# Patient Record
Sex: Male | Born: 1955 | Race: White | Hispanic: No | Marital: Married | Smoking: Never smoker
Health system: Southern US, Community
[De-identification: ages and names within clinical notes are randomized; demographics above are authoritative.]

## PROBLEM LIST (undated history)

## (undated) DIAGNOSIS — K219 Gastro-esophageal reflux disease without esophagitis: Secondary | ICD-10-CM

## (undated) DIAGNOSIS — F329 Major depressive disorder, single episode, unspecified: Secondary | ICD-10-CM

## (undated) DIAGNOSIS — E119 Type 2 diabetes mellitus without complications: Secondary | ICD-10-CM

## (undated) DIAGNOSIS — F419 Anxiety disorder, unspecified: Secondary | ICD-10-CM

## (undated) DIAGNOSIS — Z87442 Personal history of urinary calculi: Secondary | ICD-10-CM

## (undated) DIAGNOSIS — F32A Depression, unspecified: Secondary | ICD-10-CM

## (undated) DIAGNOSIS — M1990 Unspecified osteoarthritis, unspecified site: Secondary | ICD-10-CM

## (undated) DIAGNOSIS — I10 Essential (primary) hypertension: Secondary | ICD-10-CM

## (undated) HISTORY — DX: Gastro-esophageal reflux disease without esophagitis: K21.9

## (undated) HISTORY — PX: EYE SURGERY: SHX253

## (undated) HISTORY — PX: PENILE PROSTHESIS IMPLANT: SHX240

## (undated) HISTORY — PX: DG THUMB RIGHT HAND (ARMC HX): HXRAD1825

---

## 1997-07-06 ENCOUNTER — Encounter: Admission: RE | Admit: 1997-07-06 | Discharge: 1997-10-04 | Payer: Self-pay | Admitting: Internal Medicine

## 2015-08-29 DIAGNOSIS — E113593 Type 2 diabetes mellitus with proliferative diabetic retinopathy without macular edema, bilateral: Secondary | ICD-10-CM | POA: Diagnosis not present

## 2015-09-27 DIAGNOSIS — F332 Major depressive disorder, recurrent severe without psychotic features: Secondary | ICD-10-CM | POA: Diagnosis not present

## 2015-10-16 DIAGNOSIS — Z23 Encounter for immunization: Secondary | ICD-10-CM | POA: Diagnosis not present

## 2015-10-16 DIAGNOSIS — E1059 Type 1 diabetes mellitus with other circulatory complications: Secondary | ICD-10-CM | POA: Diagnosis not present

## 2015-10-16 DIAGNOSIS — F4323 Adjustment disorder with mixed anxiety and depressed mood: Secondary | ICD-10-CM | POA: Diagnosis not present

## 2015-10-16 DIAGNOSIS — I10 Essential (primary) hypertension: Secondary | ICD-10-CM | POA: Diagnosis not present

## 2015-10-16 DIAGNOSIS — I779 Disorder of arteries and arterioles, unspecified: Secondary | ICD-10-CM | POA: Diagnosis not present

## 2016-01-04 DIAGNOSIS — Z0000 Encounter for general adult medical examination without abnormal findings: Secondary | ICD-10-CM | POA: Diagnosis not present

## 2016-01-04 DIAGNOSIS — E1059 Type 1 diabetes mellitus with other circulatory complications: Secondary | ICD-10-CM | POA: Diagnosis not present

## 2016-01-04 DIAGNOSIS — Z125 Encounter for screening for malignant neoplasm of prostate: Secondary | ICD-10-CM | POA: Diagnosis not present

## 2016-01-09 DIAGNOSIS — Z6826 Body mass index (BMI) 26.0-26.9, adult: Secondary | ICD-10-CM | POA: Diagnosis not present

## 2016-01-09 DIAGNOSIS — Z0000 Encounter for general adult medical examination without abnormal findings: Secondary | ICD-10-CM | POA: Diagnosis not present

## 2016-01-09 DIAGNOSIS — E1059 Type 1 diabetes mellitus with other circulatory complications: Secondary | ICD-10-CM | POA: Diagnosis not present

## 2016-01-09 DIAGNOSIS — F4323 Adjustment disorder with mixed anxiety and depressed mood: Secondary | ICD-10-CM | POA: Diagnosis not present

## 2016-01-09 DIAGNOSIS — N183 Chronic kidney disease, stage 3 (moderate): Secondary | ICD-10-CM | POA: Diagnosis not present

## 2016-01-09 DIAGNOSIS — I951 Orthostatic hypotension: Secondary | ICD-10-CM | POA: Diagnosis not present

## 2016-01-09 DIAGNOSIS — Z1389 Encounter for screening for other disorder: Secondary | ICD-10-CM | POA: Diagnosis not present

## 2016-03-04 DIAGNOSIS — E113593 Type 2 diabetes mellitus with proliferative diabetic retinopathy without macular edema, bilateral: Secondary | ICD-10-CM | POA: Diagnosis not present

## 2016-03-04 DIAGNOSIS — H524 Presbyopia: Secondary | ICD-10-CM | POA: Diagnosis not present

## 2016-03-26 DIAGNOSIS — F332 Major depressive disorder, recurrent severe without psychotic features: Secondary | ICD-10-CM | POA: Diagnosis not present

## 2016-05-06 DIAGNOSIS — M5030 Other cervical disc degeneration, unspecified cervical region: Secondary | ICD-10-CM | POA: Diagnosis not present

## 2016-05-15 DIAGNOSIS — M5412 Radiculopathy, cervical region: Secondary | ICD-10-CM | POA: Diagnosis not present

## 2016-05-17 DIAGNOSIS — M5030 Other cervical disc degeneration, unspecified cervical region: Secondary | ICD-10-CM | POA: Diagnosis not present

## 2016-05-17 DIAGNOSIS — M4802 Spinal stenosis, cervical region: Secondary | ICD-10-CM | POA: Diagnosis not present

## 2016-05-22 DIAGNOSIS — M5412 Radiculopathy, cervical region: Secondary | ICD-10-CM | POA: Diagnosis not present

## 2016-05-25 DIAGNOSIS — S91301A Unspecified open wound, right foot, initial encounter: Secondary | ICD-10-CM | POA: Diagnosis not present

## 2016-06-03 DIAGNOSIS — F4323 Adjustment disorder with mixed anxiety and depressed mood: Secondary | ICD-10-CM | POA: Diagnosis not present

## 2016-06-03 DIAGNOSIS — N183 Chronic kidney disease, stage 3 (moderate): Secondary | ICD-10-CM | POA: Diagnosis not present

## 2016-06-03 DIAGNOSIS — I10 Essential (primary) hypertension: Secondary | ICD-10-CM | POA: Diagnosis not present

## 2016-06-03 DIAGNOSIS — Z1389 Encounter for screening for other disorder: Secondary | ICD-10-CM | POA: Diagnosis not present

## 2016-06-03 DIAGNOSIS — E1059 Type 1 diabetes mellitus with other circulatory complications: Secondary | ICD-10-CM | POA: Diagnosis not present

## 2016-06-11 DIAGNOSIS — M542 Cervicalgia: Secondary | ICD-10-CM | POA: Insufficient documentation

## 2016-06-11 HISTORY — DX: Cervicalgia: M54.2

## 2016-06-21 ENCOUNTER — Other Ambulatory Visit: Payer: Self-pay | Admitting: Neurosurgery

## 2016-06-21 DIAGNOSIS — M5030 Other cervical disc degeneration, unspecified cervical region: Secondary | ICD-10-CM | POA: Diagnosis not present

## 2016-06-21 DIAGNOSIS — G8250 Quadriplegia, unspecified: Secondary | ICD-10-CM

## 2016-06-21 DIAGNOSIS — M4722 Other spondylosis with radiculopathy, cervical region: Secondary | ICD-10-CM | POA: Diagnosis not present

## 2016-06-21 DIAGNOSIS — M4712 Other spondylosis with myelopathy, cervical region: Secondary | ICD-10-CM | POA: Diagnosis not present

## 2016-06-21 DIAGNOSIS — M5000 Cervical disc disorder with myelopathy, unspecified cervical region: Secondary | ICD-10-CM | POA: Diagnosis not present

## 2016-06-21 DIAGNOSIS — M542 Cervicalgia: Secondary | ICD-10-CM | POA: Diagnosis not present

## 2016-06-21 HISTORY — DX: Quadriplegia, unspecified: G82.50

## 2016-07-22 ENCOUNTER — Other Ambulatory Visit (HOSPITAL_COMMUNITY): Payer: Self-pay | Admitting: *Deleted

## 2016-07-22 ENCOUNTER — Encounter (HOSPITAL_COMMUNITY)
Admission: RE | Admit: 2016-07-22 | Discharge: 2016-07-22 | Disposition: A | Discharge status: Home / Self Care | Payer: BLUE CROSS/BLUE SHIELD | Source: Ambulatory Visit | Attending: Neurosurgery | Admitting: Neurosurgery

## 2016-07-22 ENCOUNTER — Encounter (HOSPITAL_COMMUNITY): Payer: Self-pay

## 2016-07-22 DIAGNOSIS — E119 Type 2 diabetes mellitus without complications: Secondary | ICD-10-CM | POA: Diagnosis not present

## 2016-07-22 DIAGNOSIS — Z01812 Encounter for preprocedural laboratory examination: Secondary | ICD-10-CM | POA: Diagnosis not present

## 2016-07-22 DIAGNOSIS — Z01810 Encounter for preprocedural cardiovascular examination: Secondary | ICD-10-CM | POA: Insufficient documentation

## 2016-07-22 DIAGNOSIS — Z87442 Personal history of urinary calculi: Secondary | ICD-10-CM | POA: Diagnosis not present

## 2016-07-22 DIAGNOSIS — M1990 Unspecified osteoarthritis, unspecified site: Secondary | ICD-10-CM | POA: Diagnosis not present

## 2016-07-22 DIAGNOSIS — I10 Essential (primary) hypertension: Secondary | ICD-10-CM | POA: Insufficient documentation

## 2016-07-22 DIAGNOSIS — F419 Anxiety disorder, unspecified: Secondary | ICD-10-CM | POA: Insufficient documentation

## 2016-07-22 DIAGNOSIS — F329 Major depressive disorder, single episode, unspecified: Secondary | ICD-10-CM | POA: Insufficient documentation

## 2016-07-22 HISTORY — DX: Anxiety disorder, unspecified: F41.9

## 2016-07-22 HISTORY — DX: Essential (primary) hypertension: I10

## 2016-07-22 HISTORY — DX: Unspecified osteoarthritis, unspecified site: M19.90

## 2016-07-22 HISTORY — DX: Personal history of urinary calculi: Z87.442

## 2016-07-22 HISTORY — DX: Major depressive disorder, single episode, unspecified: F32.9

## 2016-07-22 HISTORY — DX: Depression, unspecified: F32.A

## 2016-07-22 HISTORY — DX: Type 2 diabetes mellitus without complications: E11.9

## 2016-07-22 LAB — TYPE AND SCREEN
ABO/RH(D): O POS
Antibody Screen: NEGATIVE

## 2016-07-22 LAB — BASIC METABOLIC PANEL
Anion gap: 6 (ref 5–15)
BUN: 19 mg/dL (ref 6–20)
CO2: 27 mmol/L (ref 22–32)
Calcium: 9.2 mg/dL (ref 8.9–10.3)
Chloride: 105 mmol/L (ref 101–111)
Creatinine, Ser: 1.16 mg/dL (ref 0.61–1.24)
GFR calc Af Amer: >60 mL/min (ref >60)
GFR calc non Af Amer: >60 mL/min (ref >60)
Glucose, Bld: 199 mg/dL — ABNORMAL HIGH (ref 65–99)
Potassium: 4.3 mmol/L (ref 3.5–5.1)
Sodium: 138 mmol/L (ref 135–145)

## 2016-07-22 LAB — CBC
HCT: 45.2 % (ref 39.0–52.0)
Hemoglobin: 15.1 g/dL (ref 13.0–17.0)
MCH: 31.1 pg (ref 26.0–34.0)
MCHC: 33.4 g/dL (ref 30.0–36.0)
MCV: 93 fL (ref 78.0–100.0)
Platelets: 190 10*3/uL (ref 150–400)
RBC: 4.86 MIL/uL (ref 4.22–5.81)
RDW: 13.6 % (ref 11.5–15.5)
WBC: 6.3 10*3/uL (ref 4.0–10.5)

## 2016-07-22 LAB — SURGICAL PCR SCREEN
MRSA, PCR: NEGATIVE
Staphylococcus aureus: NEGATIVE

## 2016-07-22 LAB — ABO/RH: ABO/RH(D): O POS

## 2016-07-22 LAB — GLUCOSE, CAPILLARY: Glucose-Capillary: 205 mg/dL — ABNORMAL HIGH (ref 65–99)

## 2016-07-22 NOTE — Pre-Procedure Instructions (Signed)
Jonathan Holder  07/22/2016      [facility], [place] - [address] [employer] [employer] [place] [address] Phone: [phone] Fax: [phone]    Your procedure is scheduled on 07-31-2016 Wednesday .  Report to [facility] Admitting at 5:30 A.M   .  Call this number if you have problems the morning of surgery:  [phone]   Remember:  Do not eat food or drink liquids after midnight.   Take these medicines the morning of surgery with A SIP OF WATER Amlodipine(Norvasc),Ariprazole(Abilify),atorvastatin(Lipitor),desvenlafaxine(Pristiq),gabapentin(Neurontin)     STOP ASPIRIN,ANTIINFLAMATORIES (IBUPROFEN,ALEVE,MOTRIN,ADVIL,GOODY'S POWDERS),HERBAL SUPPLEMENTS,FISH OIL,AND VITAMINS 5-7 DAYS PRIOR TO SURGERY STOP CELEBREX      How to Manage Your Diabetes Before and After Surgery  Why is it important to control my blood sugar before and after surgery? . Improving blood sugar levels before and after surgery helps healing and can limit problems. . A way of improving blood sugar control is eating a healthy diet by: o  Eating less sugar and carbohydrates o  Increasing activity/exercise o  Talking with your doctor about reaching your blood sugar goals . High blood sugars (greater than 180 mg/dL) can raise your risk of infections and slow your recovery, so you will need to focus on controlling your diabetes during the weeks before surgery. . Make sure that the doctor who takes care of your diabetes knows about your planned surgery including the date and location.  How do I manage my blood sugar before surgery? . Check your blood sugar at least 4 times a day, starting 2 days before surgery, to make sure that the level is not too high or low. o Check your blood sugar the morning of your surgery when you wake up and every 2 hours until you get to the Short Stay unit. . If your blood sugar is less than 70 mg/dL, you will need to treat  for low blood sugar: o Do not take insulin. o Treat a low blood sugar (less than 70 mg/dL) with  cup of clear juice (cranberry or apple), 4 glucose tablets, OR glucose gel. o Recheck blood sugar in 15 minutes after treatment (to make sure it is greater than 70 mg/dL). If your blood sugar is not greater than 70 mg/dL on recheck, call [phone] for further instructions. . Report your blood sugar to the short stay nurse when you get to Short Stay.  . If you are admitted to the hospital after surgery: o Your blood sugar will be checked by the staff and you will probably be given insulin after surgery (instead of oral diabetes medicines) to make sure you have good blood sugar levels. o The goal for blood sugar control after surgery is 80-180 mg/dL.              WHAT DO I DO ABOUT MY DIABETES MEDICATION?   Marland Kitchen The night before surgery take your usual dose of insulin before meal  . THE NIGHT BEFORE SURGERY, take 20 units of _Lantus insulin.at bedtime        Reviewed and Endorsed by [facility] Patient Education Committee, August 2015    Do not wear jewelry, make-up or nail polish.  Do not wear lotions, powders, or perfumes, or deoderant.  Do not shave 48 hours prior to surgery.  Men may shave face and neck.  Do not bring valuables to the hospital.  [facility] is not responsible for any belongings or valuables.  Contacts, dentures or bridgework may not be worn into surgery.  Leave your suitcase in the car.  After surgery it may be brought to your room.  For patients admitted to the hospital, discharge time will be determined by your treatment team.  Patients discharged the day of surgery will not be allowed to drive home.   Special Instructions: [facility] - Preparing for Surgery  Before surgery, you can play an important role.  Because skin is not sterile, your skin needs to be as free of germs as possible.  You can reduce the number of germs on you skin by washing with CHG  (chlorahexidine gluconate) soap before surgery.  CHG is an antiseptic cleaner which kills germs and bonds with the skin to continue killing germs even after washing.  Please DO NOT use if you have an allergy to CHG or antibacterial soaps.  If your skin becomes reddened/irritated stop using the CHG and inform your nurse when you arrive at Short Stay.  Do not shave (including legs and underarms) for at least 48 hours prior to the first CHG shower.  You may shave your face.  Please follow these instructions carefully:   1.  Shower with CHG Soap the night before surgery and the   morning of Surgery.  2.  If you choose to wash your hair, wash your hair first as usual with your normal shampoo.  3.  After you shampoo, rinse your hair and body thoroughly to remove the  Shampoo.  4.  Use CHG as you would any other liquid soap.  You can apply chg directly  to the skin and wash gently with scrungie or a clean washcloth.  5.  Apply the CHG Soap to your body ONLY FROM THE NECK DOWN.   Do not use on open wounds or open sores.  Avoid contact with your eyes,  ears, mouth and genitals (private parts).  Wash genitals (private parts) with your normal soap.  6.  Wash thoroughly, paying special attention to the area where your surgery will be performed.  7.  Thoroughly rinse your body with warm water from the neck down.  8.  DO NOT shower/wash with your normal soap after using and rinsing o  the CHG Soap.  9.  Pat yourself dry with a clean towel.            10.  Wear clean pajamas.            11.  Place clean sheets on your bed the night of your first shower and do not sleep with pets.  Day of Surgery  Do not apply any lotions/deodorants the morning of surgery.  Please wear clean clothes to the hospital/surgery center.     Please read over the following fact sheets that you were given. MRSA Information and Surgical Site Infection Prevention

## 2016-07-22 NOTE — Progress Notes (Signed)
Never seen by cardiologist  Denies any cardiac testing  Denies any chest pain or discomfort

## 2016-07-23 LAB — HEMOGLOBIN A1C
Hgb A1c MFr Bld: 8 % — ABNORMAL HIGH (ref 4.8–5.6)
Mean Plasma Glucose: 183 mg/dL

## 2016-07-31 ENCOUNTER — Encounter (HOSPITAL_COMMUNITY): Payer: Self-pay | Admitting: General Practice

## 2016-07-31 ENCOUNTER — Encounter (HOSPITAL_COMMUNITY)
Admission: RE | Disposition: A | Discharge status: Home / Self Care | Payer: Self-pay | Source: Ambulatory Visit | Attending: Neurosurgery

## 2016-07-31 ENCOUNTER — Ambulatory Visit (HOSPITAL_COMMUNITY): Payer: BLUE CROSS/BLUE SHIELD | Admitting: Emergency Medicine

## 2016-07-31 ENCOUNTER — Ambulatory Visit (HOSPITAL_COMMUNITY): Payer: BLUE CROSS/BLUE SHIELD | Admitting: Anesthesiology

## 2016-07-31 ENCOUNTER — Observation Stay (HOSPITAL_COMMUNITY)
Admission: RE | Admit: 2016-07-31 | Discharge: 2016-08-01 | Disposition: A | Discharge status: Home / Self Care | Payer: BLUE CROSS/BLUE SHIELD | Source: Ambulatory Visit | Attending: Neurosurgery | Admitting: Neurosurgery

## 2016-07-31 ENCOUNTER — Ambulatory Visit (HOSPITAL_COMMUNITY): Payer: BLUE CROSS/BLUE SHIELD

## 2016-07-31 DIAGNOSIS — M4322 Fusion of spine, cervical region: Secondary | ICD-10-CM | POA: Diagnosis not present

## 2016-07-31 DIAGNOSIS — M4712 Other spondylosis with myelopathy, cervical region: Secondary | ICD-10-CM | POA: Insufficient documentation

## 2016-07-31 DIAGNOSIS — Z7982 Long term (current) use of aspirin: Secondary | ICD-10-CM | POA: Insufficient documentation

## 2016-07-31 DIAGNOSIS — Z794 Long term (current) use of insulin: Secondary | ICD-10-CM | POA: Diagnosis not present

## 2016-07-31 DIAGNOSIS — M50121 Cervical disc disorder at C4-C5 level with radiculopathy: Secondary | ICD-10-CM | POA: Diagnosis not present

## 2016-07-31 DIAGNOSIS — I10 Essential (primary) hypertension: Secondary | ICD-10-CM | POA: Insufficient documentation

## 2016-07-31 DIAGNOSIS — E119 Type 2 diabetes mellitus without complications: Secondary | ICD-10-CM | POA: Insufficient documentation

## 2016-07-31 DIAGNOSIS — Z419 Encounter for procedure for purposes other than remedying health state, unspecified: Secondary | ICD-10-CM

## 2016-07-31 DIAGNOSIS — Z79899 Other long term (current) drug therapy: Secondary | ICD-10-CM | POA: Insufficient documentation

## 2016-07-31 DIAGNOSIS — Z791 Long term (current) use of non-steroidal anti-inflammatories (NSAID): Secondary | ICD-10-CM | POA: Insufficient documentation

## 2016-07-31 DIAGNOSIS — M4722 Other spondylosis with radiculopathy, cervical region: Secondary | ICD-10-CM | POA: Diagnosis not present

## 2016-07-31 DIAGNOSIS — M50023 Cervical disc disorder at C6-C7 level with myelopathy: Secondary | ICD-10-CM | POA: Diagnosis not present

## 2016-07-31 DIAGNOSIS — G8250 Quadriplegia, unspecified: Secondary | ICD-10-CM | POA: Insufficient documentation

## 2016-07-31 DIAGNOSIS — M50021 Cervical disc disorder at C4-C5 level with myelopathy: Principal | ICD-10-CM | POA: Insufficient documentation

## 2016-07-31 DIAGNOSIS — M5000 Cervical disc disorder with myelopathy, unspecified cervical region: Secondary | ICD-10-CM

## 2016-07-31 DIAGNOSIS — F329 Major depressive disorder, single episode, unspecified: Secondary | ICD-10-CM | POA: Insufficient documentation

## 2016-07-31 DIAGNOSIS — M50022 Cervical disc disorder at C5-C6 level with myelopathy: Secondary | ICD-10-CM | POA: Diagnosis not present

## 2016-07-31 DIAGNOSIS — M50221 Other cervical disc displacement at C4-C5 level: Secondary | ICD-10-CM | POA: Diagnosis not present

## 2016-07-31 HISTORY — DX: Cervical disc disorder with myelopathy, unspecified cervical region: M50.00

## 2016-07-31 HISTORY — PX: ANTERIOR CERVICAL DECOMP/DISCECTOMY FUSION: SHX1161

## 2016-07-31 LAB — GLUCOSE, CAPILLARY
Glucose-Capillary: 188 mg/dL — ABNORMAL HIGH (ref 65–99)
Glucose-Capillary: 40 mg/dL — CL (ref 65–99)
Glucose-Capillary: 194 mg/dL — ABNORMAL HIGH (ref 65–99)
Glucose-Capillary: 352 mg/dL — ABNORMAL HIGH (ref 65–99)
Glucose-Capillary: 413 mg/dL — ABNORMAL HIGH (ref 65–99)

## 2016-07-31 SURGERY — ANTERIOR CERVICAL DECOMPRESSION/DISCECTOMY FUSION 3 LEVELS
Anesthesia: General

## 2016-07-31 MED ORDER — ONDANSETRON HCL 4 MG/2ML IJ SOLN: 4.0000 mg | Freq: Once | INTRAMUSCULAR | Status: DC | PRN

## 2016-07-31 MED ORDER — BUPIVACAINE HCL (PF) 0.5 % IJ SOLN
INTRAMUSCULAR | Status: DC | PRN
  Administered 2016-07-31: 20 mL

## 2016-07-31 MED ORDER — THROMBIN 20000 UNITS EX SOLR
CUTANEOUS | Status: AC
  Filled 2016-07-31: qty 20000

## 2016-07-31 MED ORDER — INSULIN GLARGINE 100 UNIT/ML ~~LOC~~ SOLN
20.0000 [IU] | Freq: Every day | SUBCUTANEOUS | Status: DC
  Administered 2016-07-31: 20 [IU] via SUBCUTANEOUS
  Filled 2016-07-31 (×2): qty 0.2

## 2016-07-31 MED ORDER — DEXAMETHASONE SODIUM PHOSPHATE 10 MG/ML IJ SOLN
INTRAMUSCULAR | Status: AC
  Filled 2016-07-31: qty 1

## 2016-07-31 MED ORDER — PROPOFOL 10 MG/ML IV BOLUS
INTRAVENOUS | Status: AC
  Filled 2016-07-31: qty 40

## 2016-07-31 MED ORDER — HYDROMORPHONE HCL 1 MG/ML IJ SOLN: 0.2500 mg | INTRAMUSCULAR | Status: DC | PRN

## 2016-07-31 MED ORDER — CYCLOBENZAPRINE HCL 5 MG PO TABS: 5.0000 mg | ORAL_TABLET | Freq: Three times a day (TID) | ORAL | Status: DC | PRN

## 2016-07-31 MED ORDER — ARIPIPRAZOLE 5 MG PO TABS
5.0000 mg | ORAL_TABLET | ORAL | Status: DC
  Administered 2016-08-01: 5 mg via ORAL
  Filled 2016-07-31 (×2): qty 1

## 2016-07-31 MED ORDER — LIDOCAINE HCL (CARDIAC) 20 MG/ML IV SOLN
INTRAVENOUS | Status: DC | PRN
  Administered 2016-07-31: 100 mg via INTRAVENOUS

## 2016-07-31 MED ORDER — LIDOCAINE 2% (20 MG/ML) 5 ML SYRINGE
INTRAMUSCULAR | Status: AC
  Filled 2016-07-31: qty 5

## 2016-07-31 MED ORDER — ONDANSETRON HCL 4 MG/2ML IJ SOLN: 4.0000 mg | Freq: Four times a day (QID) | INTRAMUSCULAR | Status: DC | PRN

## 2016-07-31 MED ORDER — ONDANSETRON HCL 4 MG PO TABS: 4.0000 mg | ORAL_TABLET | Freq: Four times a day (QID) | ORAL | Status: DC | PRN

## 2016-07-31 MED ORDER — ONDANSETRON HCL 4 MG/2ML IJ SOLN
INTRAMUSCULAR | Status: AC
  Filled 2016-07-31: qty 2

## 2016-07-31 MED ORDER — INSULIN ASPART 100 UNIT/ML ~~LOC~~ SOLN
0.0000 [IU] | Freq: Three times a day (TID) | SUBCUTANEOUS | Status: DC
  Administered 2016-07-31: 15 [IU] via SUBCUTANEOUS
  Administered 2016-08-01: 5 [IU] via SUBCUTANEOUS
  Administered 2016-08-01: 8 [IU] via SUBCUTANEOUS
  Administered 2016-08-01: 11 [IU] via SUBCUTANEOUS

## 2016-07-31 MED ORDER — GABAPENTIN 300 MG PO CAPS
900.0000 mg | ORAL_CAPSULE | ORAL | Status: DC
  Administered 2016-08-01: 900 mg via ORAL
  Filled 2016-07-31: qty 3

## 2016-07-31 MED ORDER — THROMBIN 20000 UNITS EX SOLR
CUTANEOUS | Status: DC | PRN
  Administered 2016-07-31: 09:00:00 via TOPICAL

## 2016-07-31 MED ORDER — SUGAMMADEX SODIUM 200 MG/2ML IV SOLN
INTRAVENOUS | Status: AC
  Filled 2016-07-31: qty 2

## 2016-07-31 MED ORDER — DEXTROSE 50 % IV SOLN
INTRAVENOUS | Status: AC
  Filled 2016-07-31: qty 50

## 2016-07-31 MED ORDER — ONDANSETRON HCL 4 MG/2ML IJ SOLN
INTRAMUSCULAR | Status: DC | PRN
  Administered 2016-07-31: 4 mg via INTRAVENOUS

## 2016-07-31 MED ORDER — EPHEDRINE 5 MG/ML INJ
INTRAVENOUS | Status: AC
  Filled 2016-07-31: qty 20

## 2016-07-31 MED ORDER — INSULIN ASPART 100 UNIT/ML ~~LOC~~ SOLN: 3.0000 [IU] | Freq: Three times a day (TID) | SUBCUTANEOUS | Status: DC

## 2016-07-31 MED ORDER — ATORVASTATIN CALCIUM 40 MG PO TABS
40.0000 mg | ORAL_TABLET | ORAL | Status: DC
  Administered 2016-08-01: 40 mg via ORAL
  Filled 2016-07-31: qty 1
  Filled 2016-07-31: qty 2
  Filled 2016-07-31: qty 1

## 2016-07-31 MED ORDER — BISACODYL 10 MG RE SUPP: 10.0000 mg | Freq: Every day | RECTAL | Status: DC | PRN

## 2016-07-31 MED ORDER — ROCURONIUM BROMIDE 100 MG/10ML IV SOLN
INTRAVENOUS | Status: DC | PRN
  Administered 2016-07-31: 50 mg via INTRAVENOUS
  Administered 2016-07-31 (×3): 10 mg via INTRAVENOUS

## 2016-07-31 MED ORDER — FENTANYL CITRATE (PF) 250 MCG/5ML IJ SOLN
INTRAMUSCULAR | Status: AC
  Filled 2016-07-31: qty 5

## 2016-07-31 MED ORDER — CHLORHEXIDINE GLUCONATE CLOTH 2 % EX PADS: 6.0000 | MEDICATED_PAD | Freq: Once | CUTANEOUS | Status: DC

## 2016-07-31 MED ORDER — SODIUM CHLORIDE 0.9 % IR SOLN
Status: DC | PRN
  Administered 2016-07-31: 07:00:00

## 2016-07-31 MED ORDER — PHENYLEPHRINE HCL 10 MG/ML IJ SOLN
INTRAVENOUS | Status: DC | PRN
  Administered 2016-07-31: 35 ug/min via INTRAVENOUS

## 2016-07-31 MED ORDER — HYDROCODONE-ACETAMINOPHEN 5-325 MG PO TABS
1.0000 | ORAL_TABLET | ORAL | Status: DC | PRN
  Filled 2016-07-31: qty 2

## 2016-07-31 MED ORDER — EPHEDRINE SULFATE 50 MG/ML IJ SOLN
INTRAMUSCULAR | Status: DC | PRN
  Administered 2016-07-31: 15 mg via INTRAVENOUS
  Administered 2016-07-31 (×2): 10 mg via INTRAVENOUS

## 2016-07-31 MED ORDER — LACTATED RINGERS IV SOLN
INTRAVENOUS | Status: DC
  Administered 2016-07-31 (×4): via INTRAVENOUS

## 2016-07-31 MED ORDER — AMLODIPINE BESYLATE 2.5 MG PO TABS
2.5000 mg | ORAL_TABLET | ORAL | Status: DC
  Administered 2016-08-01: 2.5 mg via ORAL
  Filled 2016-07-31 (×2): qty 1

## 2016-07-31 MED ORDER — MEPERIDINE HCL 25 MG/ML IJ SOLN: 6.2500 mg | INTRAMUSCULAR | Status: DC | PRN

## 2016-07-31 MED ORDER — DEXAMETHASONE SODIUM PHOSPHATE 10 MG/ML IJ SOLN
INTRAMUSCULAR | Status: DC | PRN
  Administered 2016-07-31: 10 mg via INTRAVENOUS

## 2016-07-31 MED ORDER — KETOROLAC TROMETHAMINE 30 MG/ML IJ SOLN
15.0000 mg | Freq: Four times a day (QID) | INTRAMUSCULAR | Status: DC
  Administered 2016-07-31 – 2016-08-01 (×3): 15 mg via INTRAVENOUS
  Filled 2016-07-31 (×3): qty 1

## 2016-07-31 MED ORDER — BUPIVACAINE HCL (PF) 0.5 % IJ SOLN
INTRAMUSCULAR | Status: AC
  Filled 2016-07-31: qty 30

## 2016-07-31 MED ORDER — VENLAFAXINE HCL ER 150 MG PO CP24
150.0000 mg | ORAL_CAPSULE | Freq: Every day | ORAL | Status: DC
  Administered 2016-08-01: 150 mg via ORAL
  Filled 2016-07-31 (×2): qty 1
  Filled 2016-07-31: qty 2

## 2016-07-31 MED ORDER — THROMBIN 5000 UNITS EX SOLR
OROMUCOSAL | Status: DC | PRN
  Administered 2016-07-31: 08:00:00 via TOPICAL

## 2016-07-31 MED ORDER — MAGNESIUM HYDROXIDE 400 MG/5ML PO SUSP: 30.0000 mL | Freq: Every day | ORAL | Status: DC | PRN

## 2016-07-31 MED ORDER — INSULIN ASPART 100 UNIT/ML ~~LOC~~ SOLN
0.0000 [IU] | Freq: Every day | SUBCUTANEOUS | Status: DC
  Administered 2016-07-31: 5 [IU] via SUBCUTANEOUS

## 2016-07-31 MED ORDER — HYDROXYZINE HCL 25 MG PO TABS: 50.0000 mg | ORAL_TABLET | ORAL | Status: DC | PRN

## 2016-07-31 MED ORDER — SODIUM CHLORIDE 0.9% FLUSH: 3.0000 mL | INTRAVENOUS | Status: DC | PRN

## 2016-07-31 MED ORDER — ACETAMINOPHEN 325 MG PO TABS
650.0000 mg | ORAL_TABLET | ORAL | Status: DC | PRN
  Administered 2016-08-01: 650 mg via ORAL
  Filled 2016-07-31: qty 2

## 2016-07-31 MED ORDER — PHENYLEPHRINE 40 MCG/ML (10ML) SYRINGE FOR IV PUSH (FOR BLOOD PRESSURE SUPPORT)
PREFILLED_SYRINGE | INTRAVENOUS | Status: AC
  Filled 2016-07-31: qty 20

## 2016-07-31 MED ORDER — HYDROXYZINE HCL 50 MG/ML IM SOLN: 50.0000 mg | INTRAMUSCULAR | Status: DC | PRN

## 2016-07-31 MED ORDER — ACETAMINOPHEN 10 MG/ML IV SOLN
INTRAVENOUS | Status: AC
  Filled 2016-07-31: qty 100

## 2016-07-31 MED ORDER — SUGAMMADEX SODIUM 200 MG/2ML IV SOLN
INTRAVENOUS | Status: DC | PRN
  Administered 2016-07-31: 150 mg via INTRAVENOUS

## 2016-07-31 MED ORDER — MENTHOL 3 MG MT LOZG
1.0000 | LOZENGE | OROMUCOSAL | Status: DC | PRN
  Filled 2016-07-31: qty 9

## 2016-07-31 MED ORDER — KETOROLAC TROMETHAMINE 30 MG/ML IJ SOLN
15.0000 mg | Freq: Once | INTRAMUSCULAR | Status: DC
  Administered 2016-07-31: 15 mg via INTRAVENOUS

## 2016-07-31 MED ORDER — 0.9 % SODIUM CHLORIDE (POUR BTL) OPTIME
TOPICAL | Status: DC | PRN
  Administered 2016-07-31: 1000 mL

## 2016-07-31 MED ORDER — LIDOCAINE-EPINEPHRINE 1 %-1:100000 IJ SOLN
INTRAMUSCULAR | Status: AC
  Filled 2016-07-31: qty 1

## 2016-07-31 MED ORDER — FENTANYL CITRATE (PF) 100 MCG/2ML IJ SOLN
INTRAMUSCULAR | Status: DC | PRN
  Administered 2016-07-31: 50 ug via INTRAVENOUS
  Administered 2016-07-31: 150 ug via INTRAVENOUS
  Administered 2016-07-31 (×2): 50 ug via INTRAVENOUS

## 2016-07-31 MED ORDER — ROCURONIUM BROMIDE 10 MG/ML (PF) SYRINGE
PREFILLED_SYRINGE | INTRAVENOUS | Status: AC
  Filled 2016-07-31: qty 5

## 2016-07-31 MED ORDER — DEXTROSE 50 % IV SOLN
1.0000 | Freq: Once | INTRAVENOUS | Status: AC
  Administered 2016-07-31: 50 mL via INTRAVENOUS

## 2016-07-31 MED ORDER — ALUM & MAG HYDROXIDE-SIMETH 200-200-20 MG/5ML PO SUSP: 30.0000 mL | Freq: Four times a day (QID) | ORAL | Status: DC | PRN

## 2016-07-31 MED ORDER — LIDOCAINE-EPINEPHRINE 1 %-1:100000 IJ SOLN
INTRAMUSCULAR | Status: DC | PRN
  Administered 2016-07-31: 20 mL

## 2016-07-31 MED ORDER — MIDAZOLAM HCL 2 MG/2ML IJ SOLN
INTRAMUSCULAR | Status: AC
  Filled 2016-07-31: qty 2

## 2016-07-31 MED ORDER — SODIUM CHLORIDE 0.9% FLUSH
3.0000 mL | Freq: Two times a day (BID) | INTRAVENOUS | Status: DC
  Administered 2016-07-31: 3 mL via INTRAVENOUS

## 2016-07-31 MED ORDER — PHENYLEPHRINE HCL 10 MG/ML IJ SOLN
INTRAMUSCULAR | Status: DC | PRN
  Administered 2016-07-31 (×3): 80 ug via INTRAVENOUS

## 2016-07-31 MED ORDER — ACETAMINOPHEN 650 MG RE SUPP: 650.0000 mg | RECTAL | Status: DC | PRN

## 2016-07-31 MED ORDER — KETOROLAC TROMETHAMINE 15 MG/ML IJ SOLN
INTRAMUSCULAR | Status: AC
  Filled 2016-07-31: qty 1

## 2016-07-31 MED ORDER — THROMBIN 5000 UNITS EX SOLR
CUTANEOUS | Status: AC
  Filled 2016-07-31: qty 5000

## 2016-07-31 MED ORDER — POTASSIUM CHLORIDE IN NACL 20-0.9 MEQ/L-% IV SOLN: INTRAVENOUS | Status: DC

## 2016-07-31 MED ORDER — MORPHINE SULFATE (PF) 4 MG/ML IV SOLN: 4.0000 mg | INTRAVENOUS | Status: DC | PRN

## 2016-07-31 MED ORDER — CEFAZOLIN SODIUM-DEXTROSE 2-4 GM/100ML-% IV SOLN
2.0000 g | INTRAVENOUS | Status: AC
  Administered 2016-07-31 (×2): 2 g via INTRAVENOUS

## 2016-07-31 MED ORDER — FLEET ENEMA 7-19 GM/118ML RE ENEM: 1.0000 | ENEMA | Freq: Once | RECTAL | Status: DC | PRN

## 2016-07-31 MED ORDER — PROPOFOL 10 MG/ML IV BOLUS
INTRAVENOUS | Status: DC | PRN
  Administered 2016-07-31: 100 mg via INTRAVENOUS

## 2016-07-31 MED ORDER — SUGAMMADEX SODIUM 500 MG/5ML IV SOLN
INTRAVENOUS | Status: AC
  Filled 2016-07-31: qty 5

## 2016-07-31 MED ORDER — PHENOL 1.4 % MT LIQD: 1.0000 | OROMUCOSAL | Status: DC | PRN

## 2016-07-31 MED ORDER — ACETAMINOPHEN 10 MG/ML IV SOLN
INTRAVENOUS | Status: DC | PRN
  Administered 2016-07-31: 1000 mg via INTRAVENOUS

## 2016-07-31 MED ORDER — MIDAZOLAM HCL 5 MG/5ML IJ SOLN
INTRAMUSCULAR | Status: DC | PRN
  Administered 2016-07-31: 2 mg via INTRAVENOUS

## 2016-07-31 SURGICAL SUPPLY — 63 items
ALLOGRAFT 7X14X11 (Bone Implant) ×4 IMPLANT
ALLOGRAFT CA 6X14X11 (Bone Implant) IMPLANT
APL SKNCLS STERI-STRIP NONHPOA (GAUZE/BANDAGES/DRESSINGS) ×1
BAG DECANTER FOR FLEXI CONT (MISCELLANEOUS) ×2 IMPLANT
BASKET BONE COLLECTION (BASKET) ×2 IMPLANT
BENZOIN TINCTURE PRP APPL 2/3 (GAUZE/BANDAGES/DRESSINGS) ×2 IMPLANT
BIT DRILL 13 (BIT) ×2 IMPLANT
BIT DRILL NEURO 2X3.1 SFT TUCH (MISCELLANEOUS) ×1 IMPLANT
BLADE ULTRA TIP 2M (BLADE) ×2 IMPLANT
CANISTER SUCT 3000ML PPV (MISCELLANEOUS) ×2 IMPLANT
CARTRIDGE OIL MAESTRO DRILL (MISCELLANEOUS) ×1 IMPLANT
COVER MAYO STAND STRL (DRAPES) ×2 IMPLANT
DECANTER SPIKE VIAL GLASS SM (MISCELLANEOUS) ×2 IMPLANT
DERMABOND ADVANCED (GAUZE/BANDAGES/DRESSINGS) ×2
DERMABOND ADVANCED .7 DNX12 (GAUZE/BANDAGES/DRESSINGS) ×2 IMPLANT
DIFFUSER DRILL AIR PNEUMATIC (MISCELLANEOUS) ×2 IMPLANT
DRAPE HALF SHEET 40X57 (DRAPES) ×2 IMPLANT
DRAPE LAPAROTOMY 100X72 PEDS (DRAPES) ×2 IMPLANT
DRAPE MICROSCOPE LEICA (MISCELLANEOUS) ×2 IMPLANT
DRAPE POUCH INSTRU U-SHP 10X18 (DRAPES) ×2 IMPLANT
DRILL NEURO 2X3.1 SOFT TOUCH (MISCELLANEOUS) ×2
ELECT COATED BLADE 2.86 ST (ELECTRODE) ×4 IMPLANT
ELECT REM PT RETURN 9FT ADLT (ELECTROSURGICAL) ×2
ELECTRODE REM PT RTRN 9FT ADLT (ELECTROSURGICAL) ×1 IMPLANT
GLOVE BIOGEL PI IND STRL 8 (GLOVE) ×3 IMPLANT
GLOVE BIOGEL PI INDICATOR 8 (GLOVE) ×3
GLOVE ECLIPSE 7.5 STRL STRAW (GLOVE) ×10 IMPLANT
GLOVE EXAM NITRILE LRG STRL (GLOVE) IMPLANT
GLOVE EXAM NITRILE XL STR (GLOVE) IMPLANT
GLOVE EXAM NITRILE XS STR PU (GLOVE) IMPLANT
GOWN STRL REUS W/ TWL LRG LVL3 (GOWN DISPOSABLE) ×1 IMPLANT
GOWN STRL REUS W/ TWL XL LVL3 (GOWN DISPOSABLE) ×1 IMPLANT
GOWN STRL REUS W/TWL 2XL LVL3 (GOWN DISPOSABLE) ×6 IMPLANT
GOWN STRL REUS W/TWL LRG LVL3 (GOWN DISPOSABLE) ×2
GOWN STRL REUS W/TWL XL LVL3 (GOWN DISPOSABLE) ×2
GRAFT CORT CANC 14X8.25X11 5D (Bone Implant) ×2 IMPLANT
HALTER HD/CHIN CERV TRACTION D (MISCELLANEOUS) ×2 IMPLANT
HEMOSTAT POWDER KIT SURGIFOAM (HEMOSTASIS) ×2 IMPLANT
KIT BASIN OR (CUSTOM PROCEDURE TRAY) ×2 IMPLANT
KIT ROOM TURNOVER OR (KITS) ×2 IMPLANT
NEEDLE HYPO 25X1 1.5 SAFETY (NEEDLE) ×2 IMPLANT
NEEDLE SPNL 22GX3.5 QUINCKE BK (NEEDLE) ×4 IMPLANT
NS IRRIG 1000ML POUR BTL (IV SOLUTION) ×2 IMPLANT
OIL CARTRIDGE MAESTRO DRILL (MISCELLANEOUS) ×2
PACK LAMINECTOMY NEURO (CUSTOM PROCEDURE TRAY) ×2 IMPLANT
PAD ARMBOARD 7.5X6 YLW CONV (MISCELLANEOUS) ×6 IMPLANT
PATTIES SURGICAL .5 X.5 (GAUZE/BANDAGES/DRESSINGS) ×2 IMPLANT
PATTIES SURGICAL 1X1 (DISPOSABLE) ×2 IMPLANT
PENCIL BUTTON HOLSTER BLD 10FT (ELECTRODE) ×2 IMPLANT
PLATE 3 55XLCK NS SPNE CVD (Plate) ×1 IMPLANT
PLATE 3 ATLANTIS TRANS (Plate) ×2 IMPLANT
RUBBERBAND STERILE (MISCELLANEOUS) ×4 IMPLANT
SCREW ST FIX 4 ATL 3120213 (Screw) ×14 IMPLANT
SPONGE INTESTINAL PEANUT (DISPOSABLE) ×2 IMPLANT
SPONGE SURGIFOAM ABS GEL 100 (HEMOSTASIS) ×2 IMPLANT
STAPLER SKIN PROX WIDE 3.9 (STAPLE) ×2 IMPLANT
SUT VIC AB 0 CT1 18XCR BRD8 (SUTURE) IMPLANT
SUT VIC AB 0 CT1 8-18 (SUTURE)
SUT VIC AB 2-0 CP2 18 (SUTURE) ×6 IMPLANT
SUT VIC AB 3-0 SH 8-18 (SUTURE) ×6 IMPLANT
TOWEL GREEN STERILE (TOWEL DISPOSABLE) ×2 IMPLANT
TOWEL GREEN STERILE FF (TOWEL DISPOSABLE) IMPLANT
WATER STERILE IRR 1000ML POUR (IV SOLUTION) ×2 IMPLANT

## 2016-07-31 NOTE — Progress Notes (Signed)
Hypoglycemic Event  CBG: 40  Treatment: D50 IV 50 mL  Symptoms: Pale  Follow-up CBG: [id number] CBG Result: 188  Possible Reasons for Event: Inadequate meal intake  Comments/MD notified: Notified Dr. Maple Hudson - received verbal order to give D50 IV 50 mL    Aleane Wesenberg R

## 2016-07-31 NOTE — Anesthesia Postprocedure Evaluation (Signed)
Anesthesia Post Note  Patient: Jonathan Holder  Procedure(s) Performed: Procedure(s) (LRB): ANTERIOR CERVICAL DECOMPRESSION/DISCECTOMY FUSION CERVICAL FOUR- CERVICAL FIVE, CERVICAL FIVE- CERVICAL SIX, CERVICAL SIX, CERVICAL SEVEN (N/A)     Patient location during evaluation: PACU Anesthesia Type: General Level of consciousness: awake and alert Pain management: pain level controlled Vital Signs Assessment: post-procedure vital signs reviewed and stable Respiratory status: spontaneous breathing, nonlabored ventilation, respiratory function stable and patient connected to nasal cannula oxygen Cardiovascular status: blood pressure returned to baseline and stable Postop Assessment: no signs of nausea or vomiting Anesthetic complications: no    Last Vitals:  Vitals:   07/31/16 1451 07/31/16 1637  BP: (!) 118/53 (!) 124/58  Pulse: 95 95  Resp: 16 16  Temp: 36.8 C 36.7 C    Last Pain:  Vitals:   07/31/16 1345  TempSrc:   PainSc: 2                  [name] DAVID

## 2016-07-31 NOTE — Progress Notes (Signed)
Inpatient Diabetes Program Recommendations  AACE/ADA: New Consensus Statement on Inpatient Glycemic Control (2015)  Target Ranges:  Prepandial:   less than 140 mg/dL      Peak postprandial:   less than 180 mg/dL (1-2 hours)      Critically ill patients:  140 - 180 mg/dL   Review of Glycemic Control  Diabetes history: DM 2 Outpatient Diabetes medications: Lantus 20 units QHS, Novolog 3-15 units tid Current orders for Inpatient glycemic control: In OR  Inpatient Diabetes Program Recommendations:    Consider Lantus 16 units QHS, Novolog Sensitive Correction tid + HS scale post surgery if admitted.  Thanks,  Christena Deem RN, MSN, [facility] Inpatient Diabetes Coordinator Team Pager [phone] (8a-5p)

## 2016-07-31 NOTE — Anesthesia Preprocedure Evaluation (Signed)
Anesthesia Evaluation  Patient identified by MRN, date of birth, ID band Patient awake    Reviewed: Allergy & Precautions, NPO status , Patient's Chart, lab work & pertinent test results  Airway Mallampati: I  TM Distance: >3 FB Neck ROM: Full    Dental   Pulmonary    Pulmonary exam normal        Cardiovascular hypertension, Pt. on medications Normal cardiovascular exam     Neuro/Psych    GI/Hepatic   Endo/Other  diabetes, Type 2, Insulin Dependent  Renal/GU      Musculoskeletal   Abdominal   Peds  Hematology   Anesthesia Other Findings   Reproductive/Obstetrics                             Anesthesia Physical Anesthesia Plan  ASA: III  Anesthesia Plan: General   Post-op Pain Management:    Induction: Intravenous  PONV Risk Score and Plan: 2 and Ondansetron and Dexamethasone  Airway Management Planned: Oral ETT  Additional Equipment:   Intra-op Plan:   Post-operative Plan: Extubation in OR  Informed Consent: I have reviewed the patients History and Physical, chart, labs and discussed the procedure including the risks, benefits and alternatives for the proposed anesthesia with the patient or authorized representative who has indicated his/her understanding and acceptance.     Plan Discussed with: CRNA and Surgeon  Anesthesia Plan Comments:         Anesthesia Quick Evaluation

## 2016-07-31 NOTE — Op Note (Signed)
07/31/2016  11:56 AM  PATIENT:  Jonathan Holder  61 y.o. 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  ASSISTANTS: Coletta Memos, M.D.  ANESTHESIA:   general  EBL:  Total I/O In: 1600 [I.V.:1600] Out: 400 [Urine:200; Blood:200]  BLOOD ADMINISTERED:none  COUNT: Correct per nursing staff  DICTATION: Patient was brought to the operating room placed under general endotracheal anesthesia. Patient was placed in 10 pounds of halter traction. The neck was prepped with Betadine soap and solution and draped in a sterile fashion. A obliquel incision was made on the left side of the neck paralleling the anterior border of the sternocleidomastoid.. The line of the incision was infiltrated with local anesthetic with epinephrine. Dissection was carried down thru the subcutaneous tissue and platysma, bipolar cautery was used to maintain hemostasis. Dissection was then carried out thru an avascular plane leaving the sternocleidomastoid carotid artery and jugular vein laterally and the trachea and esophagus medially. The ventral aspect of the vertebral column was identified and a localizing x-ray was taken. The C4-5, C5-6, C6-7 levels were identified. The annulus at each level was incised and the disc space entered. The ventral osteophytic overgrowth was removed using Kerrison punches, the osteophyte removal tool, and the high-speed drill.  Discectomy was performed with micro-curettes and pituitary rongeurs. The operating  microscope was draped and brought into the field provided additional magnification illumination and visualization. Discectomy was continued posteriorly thru the disc space and then the cartilaginous endplate was removed using micro-curettes along with the high-speed drill. Posterior osteophytic overgrowth was removed at each level using the high-speed drill along with a 2 mm thin footplated Kerrison punch. Posterior longitudinal ligament along with disc herniation was carefully removed, decompressing the spinal canal and thecal sac. We then continued to remove osteophytic overgrowth and disc material decompressing the neural foramina and exiting nerve roots bilaterally. Once the decompression was completed hemostasis was established at each level with the use of Gelfoam with thrombin and bipolar cautery. The Gelfoam was removed, a thin layer Surgifoam applied, the wound irrigated, and hemostasis confirmed. We then measured the height of each intravertebral disc space level and selected a 7 millimeter in height structural allograft for the C4-5 level, a 8 millimeter in height structural allograft for the C5-6 level, and a 7 millimeter in height structural allograft for the C6-7 level . Each was hydrated in saline solution and then gently positioned in the intravertebral disc space and countersunk. We then selected a 55 mm millimeter in height Atlantis translational cervical plate. It was positioned over the fusion construct and secured to the vertebra with 4 x 13 mm fixed screws. Each screw hole was started with the high-speed drill and then the screws placed, once all the screws were placed, the locking system was secured. Screws were placed bilaterally at C4 and C5, on the right side at C6, and bilaterally at C7. The wound was irrigated with bacitracin solution checked for hemostasis which was established and confirmed. An x-ray was taken which showed the upper portion of the fusion construct, and the interbody  implants and screws looked in good position. The shoulders obscured the lower portion of the  fusion construct. However under direct visualization the overall construct looked good. We then proceeded with closure. The platysma was closed with interrupted inverted 2-0 undyed Vicryl suture, the subcutaneous and subcuticular closed with interrupted inverted 3-0 undyed Vicryl suture. The skin edges were approximated with Dermabond. Following surgery the patient was taken out of cervical traction. To be reversed and the anesthetic and taken to the recovery room for further care.  PLAN OF CARE: Admit for overnight observation  PATIENT DISPOSITION:  PACU - hemodynamically stable.   Delay start of Pharmacological VTE agent (>24hrs) due to surgical blood loss or risk of bleeding:  yes

## 2016-07-31 NOTE — H&P (Signed)
Subjective: Patient is a 61 y.o. right-handed white male who is admitted for treatment of multilevel spondylitic cervical disc herniation with resulting stenosis, and spinal cord compression, with evidence of increased signal in the spinal cord consistent with myelopathy. Patient has developed symptoms over the past 5 months. Initially more subtle symptoms, that became increasingly pronounced. There is numbness to the left arm, forearm, and tingling into the digits of his left hand. He can be awakened at night with burning in the left hand. He's had difficulty holding a pen with his right hand. And he is not lifting his right lower extremity up as well as his left lower extremity. Patient was evaluated with MRI which showed a C4-5 broad-based spondylitic disc herniation with bilateral with bilateral osteophytic neural foraminal encroachment, and stenosis of the spinal canal, severe stenosis cervical stenosis at C5-6, worse on the right than the left, with ventral and dorsal compression, worse in the right to left, with broad-based spondylitic disc herniation and bilateral osteophytic neuroforaminal encroachment with buckling of the ligamentum flavum posteriorly and increased signal of spinal cord, and a C6 and broad-based spondylitic duration with bilateral Jari Favre nor from encroachment, left worse than right, with canal stenosis, left worse than right. He is admitted now for a 3 level C4-5, C5-6, C6-7 anterior cervical decompression arthrodesis with structural allograft and cervical plating.   Past Medical History:  Diagnosis Date  . Anxiety   . Arthritis   . Depression   . Diabetes mellitus without complication (HCC)   . History of kidney stones   . Hypertension     Past Surgical History:  Procedure Laterality Date  . DG THUMB RIGHT HAND ([facility] HX) Left    plate  . EYE SURGERY Bilateral    cataract removed    Prescriptions Prior to Admission  Medication Sig Dispense Refill Last Dose  .  amLODipine (NORVASC) 2.5 MG tablet Take 2.5 mg by mouth every morning.   07/31/2016 at 0500  . ARIPiprazole (ABILIFY) 5 MG tablet Take 5 mg by mouth every morning.   Past Week at Unknown time  . aspirin EC 81 MG tablet Take 81 mg by mouth every morning.   Past Week at Unknown time  . atorvastatin (LIPITOR) 40 MG tablet Take 40 mg by mouth every morning.   07/31/2016 at 0500  . celecoxib (CELEBREX) 200 MG capsule Take 200 mg by mouth 2 (two) times daily.   Past Week at Unknown time  . desvenlafaxine (PRISTIQ) 100 MG 24 hr tablet Take 100 mg by mouth every morning.   07/31/2016 at 0500  . gabapentin (NEURONTIN) 300 MG capsule Take 900 mg by mouth every morning.   07/31/2016 at 0500  . insulin aspart (NOVOLOG) 100 UNIT/ML injection Inject 3-15 Units into the skin 3 (three) times daily before meals.   07/30/2016 at Unknown time  . insulin glargine (LANTUS) 100 UNIT/ML injection Inject 20 Units into the skin at bedtime.    07/30/2016 at Unknown time   No Known Allergies  Social History  Substance Use Topics  . Smoking status: Never Smoker  . Smokeless tobacco: Never Used  . Alcohol use No    History reviewed. No pertinent family history.   Review of Systems A comprehensive review of systems was negative.  Objective: Vital signs in last 24 hours: Temp:  [98.1 F (36.7 C)] 98.1 F (36.7 C) (06/27 0604) Pulse Rate:  [79] 79 (06/27 0604) Resp:  [20] 20 (06/27 0604) BP: (188)/(80) 188/80 (06/27 0604) SpO2:  [  98 %] 98 % (06/27 0604) Weight:  [74.8 kg (165 lb)] 74.8 kg (165 lb) (06/27 0604)  EXAM: Patient is a well-developed well-nourished white male in no acute distress. Lungs are clear to auscultation , the patient has symmetrical respiratory excursion. Heart has a regular rate and rhythm normal S1 and S2 no murmur.   Abdomen is soft nontender nondistended bowel sounds are present. Extremity examination shows no clubbing cyanosis or edema. Neurologic examination shows on motor exam evidence of  quadriparesis. There is atrophy in the intrinsic musculature of the hands bilaterally. Strength testing shows the deltoid and biceps are 5 bilaterally. The triceps are 3 bilaterally. Grips are 4 minus to 4 bilaterally. Intrinsics are 2-3 bilaterally. The iliopsoas on the left is 5 and on the right is 4 minus. Quadriceps is 5 bilaterally. Sensation is intact to pinprick in the distal upper extremities. Reflex examination shows minimal to absent reflexes in the biceps and brachial radialis bilaterally. Left triceps is minimal to absent, right triceps is trace to 1. Left quadriceps is trace. Right quadriceps is trace to 1. Gastrocnemius are absent bilaterally. Left toe is equivocal, right toe is upgoing. Gait and stance are mildly widened and mildly spastic.  Data Review:CBC    Component Value Date/Time   WBC 6.3 07/22/2016 1154   RBC 4.86 07/22/2016 1154   HGB 15.1 07/22/2016 1154   HCT 45.2 07/22/2016 1154   PLT 190 07/22/2016 1154   MCV 93.0 07/22/2016 1154   MCH 31.1 07/22/2016 1154   MCHC 33.4 07/22/2016 1154   RDW 13.6 07/22/2016 1154                          BMET    Component Value Date/Time   NA 138 07/22/2016 1154   K 4.3 07/22/2016 1154   CL 105 07/22/2016 1154   CO2 27 07/22/2016 1154   GLUCOSE 199 (H) 07/22/2016 1154   BUN 19 07/22/2016 1154   CREATININE 1.16 07/22/2016 1154   CALCIUM 9.2 07/22/2016 1154   GFRNONAA >60 07/22/2016 1154   GFRAA >60 07/22/2016 1154     Assessment/Plan: Patient with progressive cervical myeloradiculopathy with evidence of quadriparesis and MRI evidence of increased single spinal cord with significant degeneration and neural impingement at C4-5 C5-6 and C6-7, worse at C5-6. He is admitted now for a 3 level C4-5, C5-C6, and C6-7 ACDF.  I've discussed with the patient the nature of his condition, the nature the surgical procedure, the typical length of surgery, hospital stay, and overall recuperation. We discussed limitations postoperatively. I  discussed risks of surgery including risks of infection, bleeding, possibly need for transfusion, the risk of nerve root dysfunction with pain, weakness, numbness, or paresthesias, the risk of spinal cord dysfunction with paralysis of all 4 limbs and quadriplegia, and the risk of dural tear and CSF leakage and possible need for further surgery, the risk of esophageal dysfunction causing dysphagia and the risk of laryngeal dysfunction causing hoarseness of the voice, the risk of failure of the arthrodesis and the possible need for further surgery, and the risk of anesthetic complications including myocardial infarction, stroke, pneumonia, and death. We also discussed the need for postoperative immobilization in a cervical collar. Understanding all this the patient does wish to proceed with surgery and is admitted for such.    Hewitt Shorts, MD 07/31/2016 6:43 AM

## 2016-07-31 NOTE — Anesthesia Procedure Notes (Addendum)
Procedure Name: Intubation Date/Time: 07/31/2016 7:46 AM Performed by: Scheryl Darter Pre-anesthesia Checklist: Patient identified, Emergency Drugs available, Suction available and Patient being monitored Patient Re-evaluated:Patient Re-evaluated prior to inductionOxygen Delivery Method: Circle System Utilized Preoxygenation: Pre-oxygenation with 100% oxygen Intubation Type: IV induction Ventilation: Mask ventilation without difficulty Laryngoscope Size: Miller, 2, Mac and 3 Grade View: Grade III Tube type: Oral Tube size: 7.5 mm Number of attempts: 1 Airway Equipment and Method: Stylet and Oral airway Placement Confirmation: ETT inserted through vocal cords under direct vision,  positive ETCO2 and breath sounds checked- equal and bilateral Secured at: 23 cm Tube secured with: Tape Dental Injury: Teeth and Oropharynx as per pre-operative assessment  Difficulty Due To: Difficulty was anticipated, Difficult Airway- due to anterior larynx, Difficult Airway- due to reduced neck mobility and Difficult Airway- due to limited oral opening Comments: Head and neck remained neutral during intubation

## 2016-07-31 NOTE — Transfer of Care (Signed)
Immediate Anesthesia Transfer of Care Note  Patient: Jonathan Holder  Procedure(s) Performed: Procedure(s): ANTERIOR CERVICAL DECOMPRESSION/DISCECTOMY FUSION CERVICAL FOUR- CERVICAL FIVE, CERVICAL FIVE- CERVICAL SIX, CERVICAL SIX, CERVICAL SEVEN (N/A)  Patient Location: PACU  Anesthesia Type:General  Level of Consciousness: awake, alert , oriented and sedated  Airway & Oxygen Therapy: Patient Spontanous Breathing and Patient connected to nasal cannula oxygen  Post-op Assessment: Report given to RN, Post -op Vital signs reviewed and stable and Patient moving all extremities  Post vital signs: Reviewed and stable  Last Vitals:  Vitals:   07/31/16 0604 07/31/16 1218  BP: (!) 188/80 (!) 120/55  Pulse: 79 90  Resp: 20 15  Temp: 36.7 C 36.9 C    Last Pain:  Vitals:   07/31/16 0604  TempSrc: Oral      Patients Stated Pain Goal: 1 (07/31/16 5027)  Complications: No apparent anesthesia complications

## 2016-08-01 DIAGNOSIS — Z794 Long term (current) use of insulin: Secondary | ICD-10-CM | POA: Diagnosis not present

## 2016-08-01 DIAGNOSIS — Z791 Long term (current) use of non-steroidal anti-inflammatories (NSAID): Secondary | ICD-10-CM | POA: Diagnosis not present

## 2016-08-01 DIAGNOSIS — G8250 Quadriplegia, unspecified: Secondary | ICD-10-CM | POA: Diagnosis not present

## 2016-08-01 DIAGNOSIS — Z79899 Other long term (current) drug therapy: Secondary | ICD-10-CM | POA: Diagnosis not present

## 2016-08-01 DIAGNOSIS — M4712 Other spondylosis with myelopathy, cervical region: Secondary | ICD-10-CM | POA: Diagnosis not present

## 2016-08-01 DIAGNOSIS — F329 Major depressive disorder, single episode, unspecified: Secondary | ICD-10-CM | POA: Diagnosis not present

## 2016-08-01 DIAGNOSIS — Z7982 Long term (current) use of aspirin: Secondary | ICD-10-CM | POA: Diagnosis not present

## 2016-08-01 DIAGNOSIS — M50121 Cervical disc disorder at C4-C5 level with radiculopathy: Secondary | ICD-10-CM | POA: Diagnosis not present

## 2016-08-01 DIAGNOSIS — M50021 Cervical disc disorder at C4-C5 level with myelopathy: Secondary | ICD-10-CM | POA: Diagnosis not present

## 2016-08-01 DIAGNOSIS — I10 Essential (primary) hypertension: Secondary | ICD-10-CM | POA: Diagnosis not present

## 2016-08-01 DIAGNOSIS — E119 Type 2 diabetes mellitus without complications: Secondary | ICD-10-CM | POA: Diagnosis not present

## 2016-08-01 LAB — GLUCOSE, CAPILLARY
Glucose-Capillary: 302 mg/dL — ABNORMAL HIGH (ref 65–99)
Glucose-Capillary: 249 mg/dL — ABNORMAL HIGH (ref 65–99)
Glucose-Capillary: 290 mg/dL — ABNORMAL HIGH (ref 65–99)

## 2016-08-01 MED ORDER — HYDROCODONE-ACETAMINOPHEN 5-325 MG PO TABS: 1.0000 | ORAL_TABLET | ORAL | 0 refills | Status: DC | PRN

## 2016-08-01 NOTE — Discharge Summary (Signed)
Physician Discharge Summary  Patient ID: Jonathan Holder MRN: [id number] DOB/AGE: 1955-03-17 61 y.o.  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 Postoperatively he has done well. He is up and ambulate actively. He has not needed to use any pain medication. His incision is healing nicely. There is no swelling, erythema, ecchymosis, or drainage. He is being discharged home with instructions regarding wound care and activities. He is scheduled for follow-up with me in 3 weeks.  Discharge Exam: Blood pressure (!) 141/61, pulse 75, temperature 97.6 F (36.4 C), temperature source Axillary, resp. rate 20, height 5\' 5"  (1.651 m), weight 74.8 kg (165 lb), SpO2 94 %.  Disposition:  Home  Discharge Instructions    Discharge wound care:    Complete by:  As directed    Leave the wound open to air. Shower daily with the wound uncovered. Water and soapy water should run over the incision area. Do not wash directly on the incision for 2 weeks. Remove the glue after 2 weeks.   Driving Restrictions    Complete by:  As directed    No driving for 2 weeks. May ride in the car locally now. May begin to drive locally in 2 weeks.   Other Restrictions    Complete by:  As directed    Walk gradually increasing distances out in the fresh air at least twice a day. Walking additional 6 times inside the house, gradually increasing distances, daily. No bending, lifting, or twisting. Perform  activities between shoulder and waist height (that is at counter height when standing or table height when sitting).     Allergies as of 08/01/2016   No Known Allergies     Medication List    TAKE these medications   amLODipine 2.5 MG tablet Commonly known as:  NORVASC Take 2.5 mg by mouth every morning.   ARIPiprazole 5 MG tablet Commonly known as:  ABILIFY Take 5 mg by mouth every morning.   aspirin EC 81 MG tablet Take 81 mg by mouth every morning.   atorvastatin 40 MG tablet Commonly known as:  LIPITOR Take 40 mg by mouth every morning.   celecoxib 200 MG capsule Commonly known as:  CELEBREX Take 200 mg by mouth 2 (two) times daily.   desvenlafaxine 100 MG 24 hr tablet Commonly known as:  PRISTIQ Take 100 mg by mouth every morning.   gabapentin 300 MG capsule Commonly known as:  NEURONTIN Take 900 mg by mouth every morning.   HYDROcodone-acetaminophen 5-325 MG tablet Commonly known as:  NORCO/VICODIN Take 1-2 tablets by mouth every 4 (four) hours as needed for moderate pain.   insulin aspart 100 UNIT/ML injection Commonly known as:  novoLOG Inject 3-15 Units into the skin 3 (three) times daily before meals.   insulin glargine 100 UNIT/ML injection Commonly known as:  LANTUS Inject 20 Units into the skin at bedtime.        SignedHewitt Shorts 08/01/2016, 6:16 PM

## 2016-08-01 NOTE — Progress Notes (Signed)
Inpatient Diabetes Program Recommendations  AACE/ADA: New Consensus Statement on Inpatient Glycemic Control (2015)  Target Ranges:  Prepandial:   less than 140 mg/dL      Peak postprandial:   less than 180 mg/dL (1-2 hours)      Critically ill patients:  140 - 180 mg/dL   Results for ELGIE, HANSING (MRN [id number]) as of 08/01/2016 10:30  Ref. Range 07/31/2016 12:27 07/31/2016 17:12 07/31/2016 21:24 08/01/2016 08:01  Glucose-Capillary Latest Ref Range: 65 - 99 mg/dL 165 (H) 537 (H) 482 (H) 302 (H)   Review of Glycemic Control  Diabetes history: DM 2 Outpatient Diabetes medications: Lantus 20 units QHS, Novolog 3-15 units tid Current orders for Inpatient glycemic control: Lantus 20 units, Novolog Moderate tid + Novolog HS scale + Novolog 3-15 units tid  Inpatient Diabetes Program Recommendations:    Please consider d/cing extra correction scale Novolog 3-15 units tid. Patient having hyperglycemia in the 400-300 range after Decadron 10 mg Given during surgery yesterday. Consider one time dose of an extra Lantus 5 units for glucose control today. Trends should be lower tomorrow as steroid wears off.  Thanks,  Christena Deem RN, MSN, [facility] Inpatient Diabetes Coordinator Team Pager [phone] (8a-5p)

## 2016-08-01 NOTE — Discharge Instructions (Signed)

## 2016-08-01 NOTE — Progress Notes (Signed)
Patient discharged to home with family member. Discharge instruction and prescription given and verbalized understanding.

## 2016-08-02 ENCOUNTER — Encounter (HOSPITAL_COMMUNITY): Payer: Self-pay | Admitting: Neurosurgery

## 2016-08-21 DIAGNOSIS — M5000 Cervical disc disorder with myelopathy, unspecified cervical region: Secondary | ICD-10-CM | POA: Diagnosis not present

## 2016-08-21 DIAGNOSIS — Z9889 Other specified postprocedural states: Secondary | ICD-10-CM | POA: Insufficient documentation

## 2016-08-21 HISTORY — DX: Other specified postprocedural states: Z98.890

## 2016-09-18 DIAGNOSIS — E113593 Type 2 diabetes mellitus with proliferative diabetic retinopathy without macular edema, bilateral: Secondary | ICD-10-CM | POA: Diagnosis not present

## 2016-09-24 DIAGNOSIS — F332 Major depressive disorder, recurrent severe without psychotic features: Secondary | ICD-10-CM | POA: Diagnosis not present

## 2016-10-21 DIAGNOSIS — Z9889 Other specified postprocedural states: Secondary | ICD-10-CM | POA: Diagnosis not present

## 2017-01-02 DIAGNOSIS — M79645 Pain in left finger(s): Secondary | ICD-10-CM | POA: Diagnosis not present

## 2017-02-06 DIAGNOSIS — Z0000 Encounter for general adult medical examination without abnormal findings: Secondary | ICD-10-CM | POA: Diagnosis not present

## 2017-02-06 DIAGNOSIS — Z125 Encounter for screening for malignant neoplasm of prostate: Secondary | ICD-10-CM | POA: Diagnosis not present

## 2017-02-06 DIAGNOSIS — I10 Essential (primary) hypertension: Secondary | ICD-10-CM | POA: Diagnosis not present

## 2017-02-06 DIAGNOSIS — E1059 Type 1 diabetes mellitus with other circulatory complications: Secondary | ICD-10-CM | POA: Diagnosis not present

## 2017-02-06 DIAGNOSIS — R82998 Other abnormal findings in urine: Secondary | ICD-10-CM | POA: Diagnosis not present

## 2017-02-10 DIAGNOSIS — F4323 Adjustment disorder with mixed anxiety and depressed mood: Secondary | ICD-10-CM | POA: Diagnosis not present

## 2017-02-10 DIAGNOSIS — E1139 Type 2 diabetes mellitus with other diabetic ophthalmic complication: Secondary | ICD-10-CM | POA: Diagnosis not present

## 2017-02-10 DIAGNOSIS — Z23 Encounter for immunization: Secondary | ICD-10-CM | POA: Diagnosis not present

## 2017-02-10 DIAGNOSIS — Z0000 Encounter for general adult medical examination without abnormal findings: Secondary | ICD-10-CM | POA: Diagnosis not present

## 2017-02-10 DIAGNOSIS — E1059 Type 1 diabetes mellitus with other circulatory complications: Secondary | ICD-10-CM | POA: Diagnosis not present

## 2017-02-10 DIAGNOSIS — I951 Orthostatic hypotension: Secondary | ICD-10-CM | POA: Diagnosis not present

## 2017-02-13 DIAGNOSIS — Z1212 Encounter for screening for malignant neoplasm of rectum: Secondary | ICD-10-CM | POA: Diagnosis not present

## 2017-02-17 DIAGNOSIS — M5000 Cervical disc disorder with myelopathy, unspecified cervical region: Secondary | ICD-10-CM | POA: Diagnosis not present

## 2017-02-17 DIAGNOSIS — M4712 Other spondylosis with myelopathy, cervical region: Secondary | ICD-10-CM | POA: Diagnosis not present

## 2017-02-17 DIAGNOSIS — Z6827 Body mass index (BMI) 27.0-27.9, adult: Secondary | ICD-10-CM | POA: Diagnosis not present

## 2017-02-17 DIAGNOSIS — M5030 Other cervical disc degeneration, unspecified cervical region: Secondary | ICD-10-CM | POA: Diagnosis not present

## 2017-03-25 DIAGNOSIS — E113293 Type 2 diabetes mellitus with mild nonproliferative diabetic retinopathy without macular edema, bilateral: Secondary | ICD-10-CM | POA: Diagnosis not present

## 2017-04-03 DIAGNOSIS — E113593 Type 2 diabetes mellitus with proliferative diabetic retinopathy without macular edema, bilateral: Secondary | ICD-10-CM | POA: Diagnosis not present

## 2017-04-07 DIAGNOSIS — S62667D Nondisplaced fracture of distal phalanx of left little finger, subsequent encounter for fracture with routine healing: Secondary | ICD-10-CM | POA: Diagnosis not present

## 2017-04-07 DIAGNOSIS — M65322 Trigger finger, left index finger: Secondary | ICD-10-CM | POA: Diagnosis not present

## 2017-04-07 DIAGNOSIS — S62667A Nondisplaced fracture of distal phalanx of left little finger, initial encounter for closed fracture: Secondary | ICD-10-CM | POA: Diagnosis not present

## 2017-04-28 DIAGNOSIS — J111 Influenza due to unidentified influenza virus with other respiratory manifestations: Secondary | ICD-10-CM | POA: Diagnosis not present

## 2017-05-05 DIAGNOSIS — S62667A Nondisplaced fracture of distal phalanx of left little finger, initial encounter for closed fracture: Secondary | ICD-10-CM | POA: Diagnosis not present

## 2017-05-05 DIAGNOSIS — M65322 Trigger finger, left index finger: Secondary | ICD-10-CM | POA: Diagnosis not present

## 2017-07-15 DIAGNOSIS — Z1389 Encounter for screening for other disorder: Secondary | ICD-10-CM | POA: Diagnosis not present

## 2017-07-15 DIAGNOSIS — E1059 Type 1 diabetes mellitus with other circulatory complications: Secondary | ICD-10-CM | POA: Diagnosis not present

## 2017-07-15 DIAGNOSIS — F4323 Adjustment disorder with mixed anxiety and depressed mood: Secondary | ICD-10-CM | POA: Diagnosis not present

## 2017-07-15 DIAGNOSIS — I10 Essential (primary) hypertension: Secondary | ICD-10-CM | POA: Diagnosis not present

## 2017-07-15 DIAGNOSIS — I951 Orthostatic hypotension: Secondary | ICD-10-CM | POA: Diagnosis not present

## 2017-08-13 DIAGNOSIS — F332 Major depressive disorder, recurrent severe without psychotic features: Secondary | ICD-10-CM | POA: Diagnosis not present

## 2017-09-16 DIAGNOSIS — M5430 Sciatica, unspecified side: Secondary | ICD-10-CM | POA: Diagnosis not present

## 2017-09-16 DIAGNOSIS — M545 Low back pain: Secondary | ICD-10-CM | POA: Diagnosis not present

## 2017-11-24 DIAGNOSIS — Z23 Encounter for immunization: Secondary | ICD-10-CM | POA: Diagnosis not present

## 2017-11-24 DIAGNOSIS — N183 Chronic kidney disease, stage 3 (moderate): Secondary | ICD-10-CM | POA: Diagnosis not present

## 2017-11-24 DIAGNOSIS — I951 Orthostatic hypotension: Secondary | ICD-10-CM | POA: Diagnosis not present

## 2017-11-24 DIAGNOSIS — I10 Essential (primary) hypertension: Secondary | ICD-10-CM | POA: Diagnosis not present

## 2017-11-24 DIAGNOSIS — E1059 Type 1 diabetes mellitus with other circulatory complications: Secondary | ICD-10-CM | POA: Diagnosis not present

## 2017-12-29 DIAGNOSIS — M25561 Pain in right knee: Secondary | ICD-10-CM | POA: Diagnosis not present

## 2018-02-09 DIAGNOSIS — J069 Acute upper respiratory infection, unspecified: Secondary | ICD-10-CM | POA: Diagnosis not present

## 2018-02-09 DIAGNOSIS — R05 Cough: Secondary | ICD-10-CM | POA: Diagnosis not present

## 2018-02-18 DIAGNOSIS — F332 Major depressive disorder, recurrent severe without psychotic features: Secondary | ICD-10-CM | POA: Diagnosis not present

## 2018-04-21 DIAGNOSIS — E1059 Type 1 diabetes mellitus with other circulatory complications: Secondary | ICD-10-CM | POA: Diagnosis not present

## 2018-04-21 DIAGNOSIS — Z125 Encounter for screening for malignant neoplasm of prostate: Secondary | ICD-10-CM | POA: Diagnosis not present

## 2018-04-21 DIAGNOSIS — E7849 Other hyperlipidemia: Secondary | ICD-10-CM | POA: Diagnosis not present

## 2018-04-28 DIAGNOSIS — Z0000 Encounter for general adult medical examination without abnormal findings: Secondary | ICD-10-CM | POA: Diagnosis not present

## 2018-04-28 DIAGNOSIS — N183 Chronic kidney disease, stage 3 (moderate): Secondary | ICD-10-CM | POA: Diagnosis not present

## 2018-04-28 DIAGNOSIS — I951 Orthostatic hypotension: Secondary | ICD-10-CM | POA: Diagnosis not present

## 2018-04-28 DIAGNOSIS — F4323 Adjustment disorder with mixed anxiety and depressed mood: Secondary | ICD-10-CM | POA: Diagnosis not present

## 2018-04-28 DIAGNOSIS — E1059 Type 1 diabetes mellitus with other circulatory complications: Secondary | ICD-10-CM | POA: Diagnosis not present

## 2018-07-27 DIAGNOSIS — S91202A Unspecified open wound of left great toe with damage to nail, initial encounter: Secondary | ICD-10-CM | POA: Diagnosis not present

## 2018-07-27 DIAGNOSIS — L03031 Cellulitis of right toe: Secondary | ICD-10-CM | POA: Diagnosis not present

## 2018-07-30 DIAGNOSIS — L0390 Cellulitis, unspecified: Secondary | ICD-10-CM | POA: Diagnosis not present

## 2018-08-26 DIAGNOSIS — F4323 Adjustment disorder with mixed anxiety and depressed mood: Secondary | ICD-10-CM | POA: Diagnosis not present

## 2018-08-26 DIAGNOSIS — N183 Chronic kidney disease, stage 3 (moderate): Secondary | ICD-10-CM | POA: Diagnosis not present

## 2018-08-26 DIAGNOSIS — E1059 Type 1 diabetes mellitus with other circulatory complications: Secondary | ICD-10-CM | POA: Diagnosis not present

## 2018-08-26 DIAGNOSIS — I10 Essential (primary) hypertension: Secondary | ICD-10-CM | POA: Diagnosis not present

## 2018-09-08 DIAGNOSIS — M25552 Pain in left hip: Secondary | ICD-10-CM | POA: Diagnosis not present

## 2018-09-14 DIAGNOSIS — M5410 Radiculopathy, site unspecified: Secondary | ICD-10-CM | POA: Diagnosis not present

## 2018-09-17 DIAGNOSIS — E1142 Type 2 diabetes mellitus with diabetic polyneuropathy: Secondary | ICD-10-CM | POA: Diagnosis not present

## 2018-09-17 DIAGNOSIS — E1159 Type 2 diabetes mellitus with other circulatory complications: Secondary | ICD-10-CM | POA: Diagnosis not present

## 2018-09-17 DIAGNOSIS — Z0001 Encounter for general adult medical examination with abnormal findings: Secondary | ICD-10-CM | POA: Diagnosis not present

## 2018-09-17 DIAGNOSIS — Z6824 Body mass index (BMI) 24.0-24.9, adult: Secondary | ICD-10-CM | POA: Diagnosis not present

## 2018-09-17 DIAGNOSIS — Z131 Encounter for screening for diabetes mellitus: Secondary | ICD-10-CM | POA: Diagnosis not present

## 2018-09-30 DIAGNOSIS — M542 Cervicalgia: Secondary | ICD-10-CM | POA: Diagnosis not present

## 2018-09-30 DIAGNOSIS — M25552 Pain in left hip: Secondary | ICD-10-CM | POA: Diagnosis not present

## 2018-10-13 DIAGNOSIS — M25552 Pain in left hip: Secondary | ICD-10-CM | POA: Diagnosis not present

## 2018-10-14 DIAGNOSIS — M25552 Pain in left hip: Secondary | ICD-10-CM | POA: Diagnosis not present

## 2018-10-19 DIAGNOSIS — M25552 Pain in left hip: Secondary | ICD-10-CM | POA: Diagnosis not present

## 2018-10-23 DIAGNOSIS — M25552 Pain in left hip: Secondary | ICD-10-CM | POA: Diagnosis not present

## 2018-10-27 DIAGNOSIS — M25552 Pain in left hip: Secondary | ICD-10-CM | POA: Diagnosis not present

## 2018-10-29 DIAGNOSIS — M25552 Pain in left hip: Secondary | ICD-10-CM | POA: Diagnosis not present

## 2018-11-02 DIAGNOSIS — M25552 Pain in left hip: Secondary | ICD-10-CM | POA: Diagnosis not present

## 2018-11-02 DIAGNOSIS — F4320 Adjustment disorder, unspecified: Secondary | ICD-10-CM | POA: Diagnosis not present

## 2018-11-09 DIAGNOSIS — D649 Anemia, unspecified: Secondary | ICD-10-CM | POA: Diagnosis not present

## 2018-11-13 DIAGNOSIS — Z7901 Long term (current) use of anticoagulants: Secondary | ICD-10-CM | POA: Diagnosis not present

## 2018-11-13 DIAGNOSIS — Z79899 Other long term (current) drug therapy: Secondary | ICD-10-CM | POA: Diagnosis not present

## 2018-11-13 DIAGNOSIS — D649 Anemia, unspecified: Secondary | ICD-10-CM | POA: Diagnosis not present

## 2018-11-16 DIAGNOSIS — F4320 Adjustment disorder, unspecified: Secondary | ICD-10-CM | POA: Diagnosis not present

## 2018-11-17 DIAGNOSIS — M25552 Pain in left hip: Secondary | ICD-10-CM | POA: Diagnosis not present

## 2018-11-23 DIAGNOSIS — D649 Anemia, unspecified: Secondary | ICD-10-CM | POA: Diagnosis not present

## 2018-11-23 DIAGNOSIS — M1612 Unilateral primary osteoarthritis, left hip: Secondary | ICD-10-CM | POA: Diagnosis not present

## 2018-11-23 DIAGNOSIS — M25552 Pain in left hip: Secondary | ICD-10-CM | POA: Diagnosis not present

## 2018-11-24 DIAGNOSIS — F332 Major depressive disorder, recurrent severe without psychotic features: Secondary | ICD-10-CM | POA: Diagnosis not present

## 2018-12-04 DIAGNOSIS — F4320 Adjustment disorder, unspecified: Secondary | ICD-10-CM | POA: Diagnosis not present

## 2018-12-24 DIAGNOSIS — F4320 Adjustment disorder, unspecified: Secondary | ICD-10-CM | POA: Diagnosis not present

## 2018-12-28 DIAGNOSIS — H33303 Unspecified retinal break, bilateral: Secondary | ICD-10-CM | POA: Diagnosis not present

## 2018-12-28 DIAGNOSIS — E113393 Type 2 diabetes mellitus with moderate nonproliferative diabetic retinopathy without macular edema, bilateral: Secondary | ICD-10-CM | POA: Diagnosis not present

## 2019-02-02 DIAGNOSIS — Z20828 Contact with and (suspected) exposure to other viral communicable diseases: Secondary | ICD-10-CM | POA: Diagnosis not present

## 2019-02-02 DIAGNOSIS — J3489 Other specified disorders of nose and nasal sinuses: Secondary | ICD-10-CM | POA: Diagnosis not present

## 2019-02-05 DIAGNOSIS — I779 Disorder of arteries and arterioles, unspecified: Secondary | ICD-10-CM

## 2019-02-05 HISTORY — DX: Disorder of arteries and arterioles, unspecified: I77.9

## 2019-02-09 DIAGNOSIS — H26491 Other secondary cataract, right eye: Secondary | ICD-10-CM | POA: Diagnosis not present

## 2019-02-22 DIAGNOSIS — E1140 Type 2 diabetes mellitus with diabetic neuropathy, unspecified: Secondary | ICD-10-CM | POA: Diagnosis not present

## 2019-02-22 DIAGNOSIS — D638 Anemia in other chronic diseases classified elsewhere: Secondary | ICD-10-CM | POA: Diagnosis not present

## 2019-02-22 DIAGNOSIS — E1165 Type 2 diabetes mellitus with hyperglycemia: Secondary | ICD-10-CM | POA: Diagnosis not present

## 2019-02-24 DIAGNOSIS — F332 Major depressive disorder, recurrent severe without psychotic features: Secondary | ICD-10-CM | POA: Diagnosis not present

## 2019-02-26 DIAGNOSIS — F4320 Adjustment disorder, unspecified: Secondary | ICD-10-CM | POA: Diagnosis not present

## 2019-03-12 DIAGNOSIS — G479 Sleep disorder, unspecified: Secondary | ICD-10-CM | POA: Diagnosis not present

## 2019-03-12 DIAGNOSIS — R5383 Other fatigue: Secondary | ICD-10-CM | POA: Diagnosis not present

## 2019-03-12 DIAGNOSIS — Z6823 Body mass index (BMI) 23.0-23.9, adult: Secondary | ICD-10-CM | POA: Diagnosis not present

## 2019-03-12 DIAGNOSIS — F419 Anxiety disorder, unspecified: Secondary | ICD-10-CM | POA: Diagnosis not present

## 2019-03-17 DIAGNOSIS — S46911A Strain of unspecified muscle, fascia and tendon at shoulder and upper arm level, right arm, initial encounter: Secondary | ICD-10-CM | POA: Diagnosis not present

## 2019-03-17 DIAGNOSIS — D638 Anemia in other chronic diseases classified elsewhere: Secondary | ICD-10-CM | POA: Diagnosis not present

## 2019-03-17 DIAGNOSIS — Z6823 Body mass index (BMI) 23.0-23.9, adult: Secondary | ICD-10-CM | POA: Diagnosis not present

## 2019-03-17 DIAGNOSIS — G479 Sleep disorder, unspecified: Secondary | ICD-10-CM | POA: Diagnosis not present

## 2019-03-18 DIAGNOSIS — R400 Somnolence: Secondary | ICD-10-CM | POA: Diagnosis not present

## 2019-03-18 DIAGNOSIS — R5383 Other fatigue: Secondary | ICD-10-CM | POA: Diagnosis not present

## 2019-03-18 DIAGNOSIS — R0600 Dyspnea, unspecified: Secondary | ICD-10-CM | POA: Diagnosis not present

## 2019-03-24 DIAGNOSIS — R634 Abnormal weight loss: Secondary | ICD-10-CM | POA: Diagnosis not present

## 2019-04-01 DIAGNOSIS — E7800 Pure hypercholesterolemia, unspecified: Secondary | ICD-10-CM | POA: Diagnosis not present

## 2019-04-01 DIAGNOSIS — Z79899 Other long term (current) drug therapy: Secondary | ICD-10-CM | POA: Diagnosis not present

## 2019-04-01 DIAGNOSIS — I10 Essential (primary) hypertension: Secondary | ICD-10-CM | POA: Diagnosis not present

## 2019-04-01 DIAGNOSIS — E860 Dehydration: Secondary | ICD-10-CM | POA: Diagnosis not present

## 2019-04-01 DIAGNOSIS — Z794 Long term (current) use of insulin: Secondary | ICD-10-CM | POA: Diagnosis not present

## 2019-04-01 DIAGNOSIS — E1165 Type 2 diabetes mellitus with hyperglycemia: Secondary | ICD-10-CM | POA: Diagnosis not present

## 2019-04-01 DIAGNOSIS — E1142 Type 2 diabetes mellitus with diabetic polyneuropathy: Secondary | ICD-10-CM | POA: Diagnosis not present

## 2019-04-01 DIAGNOSIS — R531 Weakness: Secondary | ICD-10-CM | POA: Diagnosis not present

## 2019-04-01 DIAGNOSIS — I951 Orthostatic hypotension: Secondary | ICD-10-CM | POA: Diagnosis not present

## 2019-04-01 DIAGNOSIS — Z7982 Long term (current) use of aspirin: Secondary | ICD-10-CM | POA: Diagnosis not present

## 2019-04-01 DIAGNOSIS — E1110 Type 2 diabetes mellitus with ketoacidosis without coma: Secondary | ICD-10-CM | POA: Diagnosis not present

## 2019-04-01 DIAGNOSIS — R5383 Other fatigue: Secondary | ICD-10-CM | POA: Diagnosis not present

## 2019-04-01 DIAGNOSIS — R5381 Other malaise: Secondary | ICD-10-CM | POA: Diagnosis not present

## 2019-04-01 DIAGNOSIS — N179 Acute kidney failure, unspecified: Secondary | ICD-10-CM | POA: Diagnosis not present

## 2019-04-02 DIAGNOSIS — E7800 Pure hypercholesterolemia, unspecified: Secondary | ICD-10-CM

## 2019-04-02 DIAGNOSIS — E1110 Type 2 diabetes mellitus with ketoacidosis without coma: Secondary | ICD-10-CM

## 2019-04-02 DIAGNOSIS — N179 Acute kidney failure, unspecified: Secondary | ICD-10-CM

## 2019-04-02 DIAGNOSIS — E860 Dehydration: Secondary | ICD-10-CM

## 2019-04-06 DIAGNOSIS — G4733 Obstructive sleep apnea (adult) (pediatric): Secondary | ICD-10-CM | POA: Diagnosis not present

## 2019-04-06 DIAGNOSIS — M5416 Radiculopathy, lumbar region: Secondary | ICD-10-CM | POA: Diagnosis not present

## 2019-04-06 DIAGNOSIS — M25552 Pain in left hip: Secondary | ICD-10-CM | POA: Diagnosis not present

## 2019-04-09 DIAGNOSIS — R5383 Other fatigue: Secondary | ICD-10-CM | POA: Diagnosis not present

## 2019-04-09 DIAGNOSIS — R0600 Dyspnea, unspecified: Secondary | ICD-10-CM | POA: Diagnosis not present

## 2019-04-09 DIAGNOSIS — R400 Somnolence: Secondary | ICD-10-CM | POA: Diagnosis not present

## 2019-04-09 DIAGNOSIS — G4733 Obstructive sleep apnea (adult) (pediatric): Secondary | ICD-10-CM | POA: Diagnosis not present

## 2019-04-12 DIAGNOSIS — J3489 Other specified disorders of nose and nasal sinuses: Secondary | ICD-10-CM | POA: Diagnosis not present

## 2019-04-12 DIAGNOSIS — Z20828 Contact with and (suspected) exposure to other viral communicable diseases: Secondary | ICD-10-CM | POA: Diagnosis not present

## 2019-04-13 DIAGNOSIS — E119 Type 2 diabetes mellitus without complications: Secondary | ICD-10-CM | POA: Diagnosis not present

## 2019-04-13 DIAGNOSIS — K2970 Gastritis, unspecified, without bleeding: Secondary | ICD-10-CM | POA: Diagnosis not present

## 2019-04-13 DIAGNOSIS — F329 Major depressive disorder, single episode, unspecified: Secondary | ICD-10-CM | POA: Diagnosis not present

## 2019-04-13 DIAGNOSIS — R634 Abnormal weight loss: Secondary | ICD-10-CM | POA: Diagnosis not present

## 2019-04-13 DIAGNOSIS — Z79899 Other long term (current) drug therapy: Secondary | ICD-10-CM | POA: Diagnosis not present

## 2019-04-13 DIAGNOSIS — D509 Iron deficiency anemia, unspecified: Secondary | ICD-10-CM | POA: Diagnosis not present

## 2019-04-13 DIAGNOSIS — R630 Anorexia: Secondary | ICD-10-CM | POA: Diagnosis not present

## 2019-04-13 DIAGNOSIS — D649 Anemia, unspecified: Secondary | ICD-10-CM | POA: Diagnosis not present

## 2019-04-13 DIAGNOSIS — Z794 Long term (current) use of insulin: Secondary | ICD-10-CM | POA: Diagnosis not present

## 2019-04-13 HISTORY — PX: COLONOSCOPY: SHX174

## 2019-04-13 HISTORY — PX: ESOPHAGOGASTRODUODENOSCOPY: SHX1529

## 2019-04-14 DIAGNOSIS — M5412 Radiculopathy, cervical region: Secondary | ICD-10-CM | POA: Diagnosis not present

## 2019-04-14 DIAGNOSIS — Z23 Encounter for immunization: Secondary | ICD-10-CM | POA: Diagnosis not present

## 2019-04-19 DIAGNOSIS — M5412 Radiculopathy, cervical region: Secondary | ICD-10-CM | POA: Diagnosis not present

## 2019-04-19 DIAGNOSIS — M542 Cervicalgia: Secondary | ICD-10-CM | POA: Diagnosis not present

## 2019-04-22 DIAGNOSIS — M5412 Radiculopathy, cervical region: Secondary | ICD-10-CM | POA: Diagnosis not present

## 2019-04-23 DIAGNOSIS — G4733 Obstructive sleep apnea (adult) (pediatric): Secondary | ICD-10-CM | POA: Diagnosis not present

## 2019-05-03 DIAGNOSIS — R5383 Other fatigue: Secondary | ICD-10-CM | POA: Diagnosis not present

## 2019-05-03 DIAGNOSIS — R400 Somnolence: Secondary | ICD-10-CM | POA: Diagnosis not present

## 2019-05-03 DIAGNOSIS — R0600 Dyspnea, unspecified: Secondary | ICD-10-CM | POA: Diagnosis not present

## 2019-05-03 DIAGNOSIS — G4733 Obstructive sleep apnea (adult) (pediatric): Secondary | ICD-10-CM | POA: Diagnosis not present

## 2019-05-12 DIAGNOSIS — R634 Abnormal weight loss: Secondary | ICD-10-CM | POA: Diagnosis not present

## 2019-05-12 DIAGNOSIS — Z23 Encounter for immunization: Secondary | ICD-10-CM | POA: Diagnosis not present

## 2019-05-12 DIAGNOSIS — R197 Diarrhea, unspecified: Secondary | ICD-10-CM | POA: Diagnosis not present

## 2019-05-12 DIAGNOSIS — K5900 Constipation, unspecified: Secondary | ICD-10-CM | POA: Diagnosis not present

## 2019-05-12 DIAGNOSIS — R109 Unspecified abdominal pain: Secondary | ICD-10-CM | POA: Diagnosis not present

## 2019-05-12 DIAGNOSIS — N200 Calculus of kidney: Secondary | ICD-10-CM | POA: Diagnosis not present

## 2019-05-12 DIAGNOSIS — D649 Anemia, unspecified: Secondary | ICD-10-CM | POA: Diagnosis not present

## 2019-05-17 DIAGNOSIS — K591 Functional diarrhea: Secondary | ICD-10-CM | POA: Diagnosis not present

## 2019-05-17 DIAGNOSIS — G4733 Obstructive sleep apnea (adult) (pediatric): Secondary | ICD-10-CM | POA: Diagnosis not present

## 2019-05-24 DIAGNOSIS — R1900 Intra-abdominal and pelvic swelling, mass and lump, unspecified site: Secondary | ICD-10-CM | POA: Diagnosis not present

## 2019-05-24 DIAGNOSIS — R634 Abnormal weight loss: Secondary | ICD-10-CM | POA: Diagnosis not present

## 2019-05-24 DIAGNOSIS — N200 Calculus of kidney: Secondary | ICD-10-CM | POA: Diagnosis not present

## 2019-05-24 DIAGNOSIS — R935 Abnormal findings on diagnostic imaging of other abdominal regions, including retroperitoneum: Secondary | ICD-10-CM | POA: Diagnosis not present

## 2019-06-03 DIAGNOSIS — F332 Major depressive disorder, recurrent severe without psychotic features: Secondary | ICD-10-CM | POA: Diagnosis not present

## 2019-06-14 DIAGNOSIS — D509 Iron deficiency anemia, unspecified: Secondary | ICD-10-CM | POA: Diagnosis not present

## 2019-06-16 DIAGNOSIS — G4733 Obstructive sleep apnea (adult) (pediatric): Secondary | ICD-10-CM | POA: Diagnosis not present

## 2019-06-18 DIAGNOSIS — R634 Abnormal weight loss: Secondary | ICD-10-CM | POA: Diagnosis not present

## 2019-06-18 DIAGNOSIS — D649 Anemia, unspecified: Secondary | ICD-10-CM | POA: Diagnosis not present

## 2019-06-18 DIAGNOSIS — D509 Iron deficiency anemia, unspecified: Secondary | ICD-10-CM | POA: Diagnosis not present

## 2019-06-18 DIAGNOSIS — D510 Vitamin B12 deficiency anemia due to intrinsic factor deficiency: Secondary | ICD-10-CM | POA: Diagnosis not present

## 2019-06-23 DIAGNOSIS — G4733 Obstructive sleep apnea (adult) (pediatric): Secondary | ICD-10-CM | POA: Diagnosis not present

## 2019-06-23 DIAGNOSIS — R5383 Other fatigue: Secondary | ICD-10-CM | POA: Diagnosis not present

## 2019-06-23 DIAGNOSIS — R400 Somnolence: Secondary | ICD-10-CM | POA: Diagnosis not present

## 2019-07-17 DIAGNOSIS — G4733 Obstructive sleep apnea (adult) (pediatric): Secondary | ICD-10-CM | POA: Diagnosis not present

## 2019-08-02 DIAGNOSIS — G4733 Obstructive sleep apnea (adult) (pediatric): Secondary | ICD-10-CM | POA: Diagnosis not present

## 2019-08-02 DIAGNOSIS — R400 Somnolence: Secondary | ICD-10-CM | POA: Diagnosis not present

## 2019-08-02 DIAGNOSIS — R5383 Other fatigue: Secondary | ICD-10-CM | POA: Diagnosis not present

## 2019-08-12 DIAGNOSIS — E113393 Type 2 diabetes mellitus with moderate nonproliferative diabetic retinopathy without macular edema, bilateral: Secondary | ICD-10-CM | POA: Diagnosis not present

## 2019-08-16 DIAGNOSIS — G4733 Obstructive sleep apnea (adult) (pediatric): Secondary | ICD-10-CM | POA: Diagnosis not present

## 2019-08-20 ENCOUNTER — Encounter (HOSPITAL_COMMUNITY): Payer: Self-pay | Admitting: Cardiology

## 2019-08-20 ENCOUNTER — Other Ambulatory Visit: Payer: Self-pay | Admitting: *Deleted

## 2019-08-20 ENCOUNTER — Inpatient Hospital Stay (HOSPITAL_COMMUNITY)
Admission: AD | Disposition: A | Discharge status: Home / Self Care | Payer: Self-pay | Source: Other Acute Inpatient Hospital | Attending: Cardiothoracic Surgery

## 2019-08-20 ENCOUNTER — Inpatient Hospital Stay (HOSPITAL_COMMUNITY)
Admission: AD | Admit: 2019-08-20 | Discharge: 2019-08-30 | DRG: 234 | Disposition: A | Discharge status: Home / Self Care | Payer: BC Managed Care – PPO | Source: Other Acute Inpatient Hospital | Attending: Cardiothoracic Surgery | Admitting: Cardiothoracic Surgery

## 2019-08-20 DIAGNOSIS — E1169 Type 2 diabetes mellitus with other specified complication: Secondary | ICD-10-CM | POA: Diagnosis present

## 2019-08-20 DIAGNOSIS — I5022 Chronic systolic (congestive) heart failure: Secondary | ICD-10-CM | POA: Diagnosis present

## 2019-08-20 DIAGNOSIS — I220 Subsequent ST elevation (STEMI) myocardial infarction of anterior wall: Secondary | ICD-10-CM

## 2019-08-20 DIAGNOSIS — I2109 ST elevation (STEMI) myocardial infarction involving other coronary artery of anterior wall: Secondary | ICD-10-CM | POA: Diagnosis present

## 2019-08-20 DIAGNOSIS — Z9841 Cataract extraction status, right eye: Secondary | ICD-10-CM

## 2019-08-20 DIAGNOSIS — M1990 Unspecified osteoarthritis, unspecified site: Secondary | ICD-10-CM | POA: Diagnosis present

## 2019-08-20 DIAGNOSIS — N179 Acute kidney failure, unspecified: Secondary | ICD-10-CM | POA: Diagnosis not present

## 2019-08-20 DIAGNOSIS — Z7982 Long term (current) use of aspirin: Secondary | ICD-10-CM | POA: Diagnosis not present

## 2019-08-20 DIAGNOSIS — G8929 Other chronic pain: Secondary | ICD-10-CM | POA: Diagnosis present

## 2019-08-20 DIAGNOSIS — J90 Pleural effusion, not elsewhere classified: Secondary | ICD-10-CM | POA: Diagnosis not present

## 2019-08-20 DIAGNOSIS — I081 Rheumatic disorders of both mitral and tricuspid valves: Secondary | ICD-10-CM | POA: Diagnosis not present

## 2019-08-20 DIAGNOSIS — Z79899 Other long term (current) drug therapy: Secondary | ICD-10-CM | POA: Diagnosis not present

## 2019-08-20 DIAGNOSIS — R61 Generalized hyperhidrosis: Secondary | ICD-10-CM | POA: Diagnosis not present

## 2019-08-20 DIAGNOSIS — I10 Essential (primary) hypertension: Secondary | ICD-10-CM | POA: Diagnosis present

## 2019-08-20 DIAGNOSIS — E118 Type 2 diabetes mellitus with unspecified complications: Secondary | ICD-10-CM

## 2019-08-20 DIAGNOSIS — Z87898 Personal history of other specified conditions: Secondary | ICD-10-CM | POA: Diagnosis not present

## 2019-08-20 DIAGNOSIS — M954 Acquired deformity of chest and rib: Secondary | ICD-10-CM | POA: Diagnosis not present

## 2019-08-20 DIAGNOSIS — E1069 Type 1 diabetes mellitus with other specified complication: Secondary | ICD-10-CM | POA: Diagnosis not present

## 2019-08-20 DIAGNOSIS — Z9842 Cataract extraction status, left eye: Secondary | ICD-10-CM

## 2019-08-20 DIAGNOSIS — Z86711 Personal history of pulmonary embolism: Secondary | ICD-10-CM

## 2019-08-20 DIAGNOSIS — F419 Anxiety disorder, unspecified: Secondary | ICD-10-CM | POA: Diagnosis present

## 2019-08-20 DIAGNOSIS — Z09 Encounter for follow-up examination after completed treatment for conditions other than malignant neoplasm: Secondary | ICD-10-CM

## 2019-08-20 DIAGNOSIS — Z20822 Contact with and (suspected) exposure to covid-19: Secondary | ICD-10-CM | POA: Diagnosis present

## 2019-08-20 DIAGNOSIS — I214 Non-ST elevation (NSTEMI) myocardial infarction: Secondary | ICD-10-CM | POA: Diagnosis not present

## 2019-08-20 DIAGNOSIS — I110 Hypertensive heart disease with heart failure: Secondary | ICD-10-CM | POA: Diagnosis present

## 2019-08-20 DIAGNOSIS — I2510 Atherosclerotic heart disease of native coronary artery without angina pectoris: Secondary | ICD-10-CM

## 2019-08-20 DIAGNOSIS — Z9889 Other specified postprocedural states: Secondary | ICD-10-CM | POA: Diagnosis not present

## 2019-08-20 DIAGNOSIS — D62 Acute posthemorrhagic anemia: Secondary | ICD-10-CM | POA: Diagnosis not present

## 2019-08-20 DIAGNOSIS — I219 Acute myocardial infarction, unspecified: Secondary | ICD-10-CM | POA: Diagnosis not present

## 2019-08-20 DIAGNOSIS — I209 Angina pectoris, unspecified: Secondary | ICD-10-CM | POA: Diagnosis not present

## 2019-08-20 DIAGNOSIS — E7800 Pure hypercholesterolemia, unspecified: Secondary | ICD-10-CM | POA: Diagnosis not present

## 2019-08-20 DIAGNOSIS — I472 Ventricular tachycardia: Secondary | ICD-10-CM | POA: Diagnosis not present

## 2019-08-20 DIAGNOSIS — I253 Aneurysm of heart: Secondary | ICD-10-CM

## 2019-08-20 DIAGNOSIS — E785 Hyperlipidemia, unspecified: Secondary | ICD-10-CM | POA: Diagnosis present

## 2019-08-20 DIAGNOSIS — R9431 Abnormal electrocardiogram [ECG] [EKG]: Secondary | ICD-10-CM | POA: Diagnosis not present

## 2019-08-20 DIAGNOSIS — Z87442 Personal history of urinary calculi: Secondary | ICD-10-CM | POA: Diagnosis not present

## 2019-08-20 DIAGNOSIS — Z48812 Encounter for surgical aftercare following surgery on the circulatory system: Secondary | ICD-10-CM | POA: Diagnosis not present

## 2019-08-20 DIAGNOSIS — Z951 Presence of aortocoronary bypass graft: Secondary | ICD-10-CM

## 2019-08-20 DIAGNOSIS — F329 Major depressive disorder, single episode, unspecified: Secondary | ICD-10-CM | POA: Diagnosis present

## 2019-08-20 DIAGNOSIS — I509 Heart failure, unspecified: Secondary | ICD-10-CM

## 2019-08-20 DIAGNOSIS — I255 Ischemic cardiomyopathy: Secondary | ICD-10-CM | POA: Diagnosis present

## 2019-08-20 DIAGNOSIS — E119 Type 2 diabetes mellitus without complications: Secondary | ICD-10-CM | POA: Diagnosis not present

## 2019-08-20 DIAGNOSIS — Z981 Arthrodesis status: Secondary | ICD-10-CM | POA: Diagnosis not present

## 2019-08-20 DIAGNOSIS — Z01810 Encounter for preprocedural cardiovascular examination: Secondary | ICD-10-CM | POA: Diagnosis not present

## 2019-08-20 DIAGNOSIS — I517 Cardiomegaly: Secondary | ICD-10-CM | POA: Diagnosis not present

## 2019-08-20 DIAGNOSIS — Z794 Long term (current) use of insulin: Secondary | ICD-10-CM

## 2019-08-20 DIAGNOSIS — J9811 Atelectasis: Secondary | ICD-10-CM | POA: Diagnosis not present

## 2019-08-20 HISTORY — DX: Type 2 diabetes mellitus with unspecified complications: E11.8

## 2019-08-20 HISTORY — DX: Type 2 diabetes mellitus with other specified complication: E11.69

## 2019-08-20 HISTORY — DX: Atherosclerotic heart disease of native coronary artery without angina pectoris: I25.10

## 2019-08-20 HISTORY — DX: Long term (current) use of insulin: Z79.4

## 2019-08-20 HISTORY — DX: Aneurysm of heart: I25.3

## 2019-08-20 HISTORY — PX: LEFT HEART CATH AND CORONARY ANGIOGRAPHY: CATH118249

## 2019-08-20 HISTORY — DX: Essential (primary) hypertension: I10

## 2019-08-20 HISTORY — DX: Subsequent ST elevation (STEMI) myocardial infarction of anterior wall: I22.0

## 2019-08-20 LAB — BLOOD GAS, ARTERIAL
Acid-Base Excess: 4.7 mmol/L — ABNORMAL HIGH (ref 0.0–2.0)
Bicarbonate: 28.8 mmol/L — ABNORMAL HIGH (ref 20.0–28.0)
Drawn by: 44135
FIO2: 21
O2 Saturation: 97.5 %
Patient temperature: 36.8
pCO2 arterial: 43.7 mmHg (ref 32.0–48.0)
pH, Arterial: 7.434 (ref 7.350–7.450)
pO2, Arterial: 93.6 mmHg (ref 83.0–108.0)

## 2019-08-20 LAB — POCT ACTIVATED CLOTTING TIME: Activated Clotting Time: 147 seconds

## 2019-08-20 LAB — HEMOGLOBIN A1C
Hgb A1c MFr Bld: 9.7 % — ABNORMAL HIGH (ref 4.8–5.6)
Mean Plasma Glucose: 231.69 mg/dL

## 2019-08-20 LAB — TROPONIN I (HIGH SENSITIVITY)
Troponin I (High Sensitivity): 4189 ng/L (ref <18)
Troponin I (High Sensitivity): 4969 ng/L (ref <18)

## 2019-08-20 LAB — GLUCOSE, CAPILLARY: Glucose-Capillary: 153 mg/dL — ABNORMAL HIGH (ref 70–99)

## 2019-08-20 SURGERY — LEFT HEART CATH AND CORONARY ANGIOGRAPHY
Anesthesia: LOCAL

## 2019-08-20 MED ORDER — INSULIN GLARGINE 100 UNIT/ML ~~LOC~~ SOLN
20.0000 [IU] | Freq: Every day | SUBCUTANEOUS | Status: DC
  Administered 2019-08-21 – 2019-08-23 (×3): 20 [IU] via SUBCUTANEOUS
  Filled 2019-08-20 (×4): qty 0.2

## 2019-08-20 MED ORDER — IOHEXOL 350 MG/ML SOLN
INTRAVENOUS | Status: DC | PRN
  Administered 2019-08-20: 50 mL

## 2019-08-20 MED ORDER — ONDANSETRON HCL 4 MG/2ML IJ SOLN: 4.0000 mg | Freq: Four times a day (QID) | INTRAMUSCULAR | Status: DC | PRN

## 2019-08-20 MED ORDER — SODIUM CHLORIDE 0.9 % WEIGHT BASED INFUSION: 1.0000 mL/kg/h | INTRAVENOUS | Status: AC

## 2019-08-20 MED ORDER — GABAPENTIN 300 MG PO CAPS
900.0000 mg | ORAL_CAPSULE | ORAL | Status: DC
  Administered 2019-08-21 – 2019-08-30 (×9): 900 mg via ORAL
  Filled 2019-08-20 (×10): qty 3

## 2019-08-20 MED ORDER — LIDOCAINE HCL (PF) 1 % IJ SOLN
INTRAMUSCULAR | Status: DC | PRN
  Administered 2019-08-20: 15 mL
  Administered 2019-08-20: 2 mL

## 2019-08-20 MED ORDER — NITROGLYCERIN 1 MG/10 ML FOR IR/CATH LAB
INTRA_ARTERIAL | Status: AC
  Filled 2019-08-20: qty 10

## 2019-08-20 MED ORDER — NITROGLYCERIN IN D5W 200-5 MCG/ML-% IV SOLN
0.0000 ug/min | INTRAVENOUS | Status: DC
  Administered 2019-08-20 – 2019-08-24 (×2): 10 ug/min via INTRAVENOUS
  Filled 2019-08-20: qty 250

## 2019-08-20 MED ORDER — SODIUM CHLORIDE 0.9 % IV SOLN: 250.0000 mL | INTRAVENOUS | Status: DC | PRN

## 2019-08-20 MED ORDER — LABETALOL HCL 5 MG/ML IV SOLN: 10.0000 mg | INTRAVENOUS | Status: AC | PRN

## 2019-08-20 MED ORDER — VERAPAMIL HCL 2.5 MG/ML IV SOLN
INTRAVENOUS | Status: AC
  Filled 2019-08-20: qty 2

## 2019-08-20 MED ORDER — ATORVASTATIN CALCIUM 80 MG PO TABS
80.0000 mg | ORAL_TABLET | Freq: Every day | ORAL | Status: DC
  Administered 2019-08-21 – 2019-08-30 (×9): 80 mg via ORAL
  Filled 2019-08-20 (×9): qty 1

## 2019-08-20 MED ORDER — HEPARIN (PORCINE) IN NACL 1000-0.9 UT/500ML-% IV SOLN
INTRAVENOUS | Status: AC
  Filled 2019-08-20: qty 1000

## 2019-08-20 MED ORDER — INSULIN ASPART 100 UNIT/ML ~~LOC~~ SOLN: 3.0000 [IU] | Freq: Three times a day (TID) | SUBCUTANEOUS | Status: DC

## 2019-08-20 MED ORDER — METOPROLOL TARTRATE 25 MG PO TABS
25.0000 mg | ORAL_TABLET | Freq: Two times a day (BID) | ORAL | Status: DC
  Administered 2019-08-20 – 2019-08-23 (×7): 25 mg via ORAL
  Filled 2019-08-20 (×7): qty 1

## 2019-08-20 MED ORDER — HYDROCODONE-ACETAMINOPHEN 5-325 MG PO TABS: 1.0000 | ORAL_TABLET | ORAL | Status: DC | PRN

## 2019-08-20 MED ORDER — FENTANYL CITRATE (PF) 100 MCG/2ML IJ SOLN
INTRAMUSCULAR | Status: DC | PRN
  Administered 2019-08-20: 25 ug via INTRAVENOUS

## 2019-08-20 MED ORDER — FENTANYL CITRATE (PF) 100 MCG/2ML IJ SOLN
INTRAMUSCULAR | Status: AC
  Filled 2019-08-20: qty 2

## 2019-08-20 MED ORDER — NITROGLYCERIN IN D5W 200-5 MCG/ML-% IV SOLN
INTRAVENOUS | Status: AC
  Filled 2019-08-20: qty 250

## 2019-08-20 MED ORDER — INSULIN ASPART 100 UNIT/ML ~~LOC~~ SOLN
0.0000 [IU] | Freq: Three times a day (TID) | SUBCUTANEOUS | Status: DC
  Administered 2019-08-21: 1 [IU] via SUBCUTANEOUS
  Administered 2019-08-22: 2 [IU] via SUBCUTANEOUS
  Administered 2019-08-22: 5 [IU] via SUBCUTANEOUS
  Administered 2019-08-23 (×2): 3 [IU] via SUBCUTANEOUS

## 2019-08-20 MED ORDER — SODIUM CHLORIDE 0.9% FLUSH
3.0000 mL | Freq: Two times a day (BID) | INTRAVENOUS | Status: DC
  Administered 2019-08-21 – 2019-08-23 (×3): 3 mL via INTRAVENOUS

## 2019-08-20 MED ORDER — ACETAMINOPHEN 325 MG PO TABS: 650.0000 mg | ORAL_TABLET | ORAL | Status: DC | PRN

## 2019-08-20 MED ORDER — INSULIN ASPART 100 UNIT/ML ~~LOC~~ SOLN: 0.0000 [IU] | Freq: Three times a day (TID) | SUBCUTANEOUS | Status: DC

## 2019-08-20 MED ORDER — ASPIRIN EC 81 MG PO TBEC
81.0000 mg | DELAYED_RELEASE_TABLET | ORAL | Status: DC
  Administered 2019-08-21 – 2019-08-23 (×3): 81 mg via ORAL
  Filled 2019-08-20 (×3): qty 1

## 2019-08-20 MED ORDER — HYDRALAZINE HCL 20 MG/ML IJ SOLN: 10.0000 mg | INTRAMUSCULAR | Status: AC | PRN

## 2019-08-20 MED ORDER — MIDAZOLAM HCL 2 MG/2ML IJ SOLN
INTRAMUSCULAR | Status: AC
  Filled 2019-08-20: qty 2

## 2019-08-20 MED ORDER — MIDAZOLAM HCL 2 MG/2ML IJ SOLN
INTRAMUSCULAR | Status: DC | PRN
  Administered 2019-08-20: 1 mg via INTRAVENOUS

## 2019-08-20 MED ORDER — HEPARIN (PORCINE) IN NACL 1000-0.9 UT/500ML-% IV SOLN
INTRAVENOUS | Status: DC | PRN
  Administered 2019-08-20 (×2): 500 mL

## 2019-08-20 MED ORDER — HEPARIN SODIUM (PORCINE) 1000 UNIT/ML IJ SOLN
INTRAMUSCULAR | Status: AC
  Filled 2019-08-20: qty 1

## 2019-08-20 MED ORDER — DESVENLAFAXINE SUCCINATE ER 100 MG PO TB24
100.0000 mg | ORAL_TABLET | ORAL | Status: DC
  Administered 2019-08-21 – 2019-08-30 (×9): 100 mg via ORAL
  Filled 2019-08-20 (×10): qty 1

## 2019-08-20 MED ORDER — AMLODIPINE BESYLATE 2.5 MG PO TABS: 2.5000 mg | ORAL_TABLET | ORAL | Status: DC

## 2019-08-20 MED ORDER — SODIUM CHLORIDE 0.9% FLUSH: 3.0000 mL | INTRAVENOUS | Status: DC | PRN

## 2019-08-20 MED ORDER — HEPARIN (PORCINE) 25000 UT/250ML-% IV SOLN
1000.0000 [IU]/h | INTRAVENOUS | Status: DC
  Administered 2019-08-20: 900 [IU]/h via INTRAVENOUS
  Administered 2019-08-22 – 2019-08-23 (×2): 1000 [IU]/h via INTRAVENOUS
  Filled 2019-08-20 (×3): qty 250

## 2019-08-20 MED ORDER — ARIPIPRAZOLE 5 MG PO TABS: 5.0000 mg | ORAL_TABLET | ORAL | Status: DC

## 2019-08-20 MED ORDER — LIDOCAINE HCL (PF) 1 % IJ SOLN
INTRAMUSCULAR | Status: AC
  Filled 2019-08-20: qty 30

## 2019-08-20 SURGICAL SUPPLY — 12 items
CATH 5FR JL3.5 JR4 ANG PIG MP (CATHETERS) ×2 IMPLANT
DEVICE RAD COMP TR BAND LRG (VASCULAR PRODUCTS) ×2 IMPLANT
GLIDESHEATH SLEND SS 6F .021 (SHEATH) ×2 IMPLANT
GUIDEWIRE INQWIRE 1.5J.035X260 (WIRE) ×1 IMPLANT
INQWIRE 1.5J .035X260CM (WIRE) ×2
KIT HEART LEFT (KITS) ×2 IMPLANT
PACK CARDIAC CATHETERIZATION (CUSTOM PROCEDURE TRAY) ×2 IMPLANT
SHEATH PINNACLE 6F 10CM (SHEATH) ×2 IMPLANT
SHEATH PROBE COVER 6X72 (BAG) ×2 IMPLANT
TRANSDUCER W/STOPCOCK (MISCELLANEOUS) ×2 IMPLANT
TUBING CIL FLEX 10 FLL-RA (TUBING) ×2 IMPLANT
WIRE EMERALD 3MM-J .035X150CM (WIRE) ×2 IMPLANT

## 2019-08-20 NOTE — Progress Notes (Signed)
° ° °  Pt seen by Dr. Bing Matter at [facility] and transferred to [facility] for [facility] 08/20/19 for NSTEMI. Please see paper chart to be scanned at patient discharge for full details.   In summary, he is a 64yo with DM1, HTN, hx of PE and dyslipidemia who presented to his PCP for a regular checkup at which time an EKG was performed that showed ST segment elevation in multiple leads therefore he was advised to go to the ED for further evaluation. Troponin found to be 10.7 at which time cardiology was consulted. He stated that two weeks ago he had an episode of chest pain in the middle of the night with associated diaphoresis with pain radiation to the left arm.  Georgie Chard NP-C [facility] Pager: [phone]

## 2019-08-20 NOTE — Consult Note (Signed)
[address]       [place] [address]             [phone]          CARDIOTHORACIC SURGERY CONSULTATION REPORT  PCP is Lovette Cliche, MD Referring Provider is Glenetta Hew, MD Primary Cardiologist is Jenne Campus, MD  Reason for consultation:  Multivessel CAD s/p Acute Myocardial Infarction  HPI:  Patient is a 64 year old male with no previous history of coronary artery disease but risk factors notable for history of hypertension, insulin-dependent type 1 diabetes mellitus, and hyperlipidemia who was admitted to [facility] with an acute myocardial infarction transferred to [facility] and has now been referred for surgical consultation to discuss treatment options for management of severe multivessel coronary artery disease.  Patient states that he has had decreased energy and worsening fatigue for nearly a year.  Approximately 2 weeks ago he had an episode of substernal chest pain radiating to his left shoulder and left arm that lasted more than 15 minutes but eventually subsided after he took some aspirin.  Over the past 2 weeks he has had 2 more similar episodes of discomfort which were not quite as severe and resolved within a few minutes.  He was seen by his primary care physician and an EKG was performed that reportedly revealed ST segment elevation.  The patient was sent directly to [facility] in [place] where he was admitted to the hospital and evaluated by Dr. Agustin Cree.  Troponin level was elevated and the patient was subsequently transferred to [facility] where he underwent diagnostic cardiac catheterization by Dr. Ellyn Hack.  Catheterization revealed severe three-vessel coronary artery disease with depressed left ventricular systolic function and mildly elevated left ventricular end-diastolic pressure.  Cardiothoracic surgical consultation was requested.  Patient is married and lives in [place] with his  wife.  Both his wife and his daughter are at the bedside for consultation this evening.  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  The patient does not exercise on a regular basis but he reports no particular physical limitations.  He denies any previous history of exertional chest pain, chest tightness, or shortness of breath.  He describes episode of chest discomfort as noted previously and has not had any pain in his chest for nearly 1 week.  He denies any history of PND, orthopnea, or lower extremity edema.  He has not had tachypalpitations, dizzy spells, nor syncope.  Past Medical History:  Diagnosis Date  . Anxiety   . Arthritis   . Depression   . Diabetes mellitus without complication ([place])   . History of kidney stones   . Hypertension     Past Surgical History:  Procedure Laterality Date  . ANTERIOR CERVICAL DECOMP/DISCECTOMY FUSION N/A 07/31/2016   Procedure: ANTERIOR CERVICAL DECOMPRESSION/DISCECTOMY FUSION CERVICAL FOUR- CERVICAL FIVE, CERVICAL FIVE- CERVICAL SIX, CERVICAL SIX, CERVICAL SEVEN;  Surgeon: Jovita Gamma, MD;  Location: [facility];  Service: Neurosurgery;  Laterality: N/A;  . DG THUMB RIGHT HAND ([place] HX) Left    plate  . EYE SURGERY Bilateral    cataract removed  . PENILE PROSTHESIS IMPLANT      History reviewed. No pertinent family history.  Social History   Socioeconomic History  . Marital status: Married    Spouse name: Not on file  . Number of children: Not on file  . Years of education: Not on file  .  Highest education level: Not on file  Occupational History  . Not on file  Tobacco Use  . Smoking status: Never Smoker  . Smokeless tobacco: Never Used  Vaping Use  . Vaping Use: Never used  Substance and Sexual Activity  . Alcohol use: No  . Drug use: No  . Sexual activity: Not on file  Other Topics Concern  . Not on file  Social History Narrative   Works for [employer] Strain:   . Difficulty of Paying Living Expenses:   Food Insecurity:   . Worried About Charity fundraiser in the Last Year:   . Arboriculturist in the Last Year:   Transportation Needs:   . Film/video editor (Medical):   Marland Kitchen Lack of Transportation (Non-Medical):   Physical Activity:   . Days of Exercise per Week:   . Minutes of Exercise per Session:   Stress:   . Feeling of Stress :   Social Connections:   . Frequency of Communication with Friends and Family:   . Frequency of Social Gatherings with Friends and Family:   . Attends Religious Services:   . Active Member of Clubs or Organizations:   . Attends Archivist Meetings:   Marland Kitchen Marital Status:   Intimate Partner Violence:   . Fear of Current or Ex-Partner:   . Emotionally Abused:   Marland Kitchen Physically Abused:   . Sexually Abused:     Prior to Admission medications   Medication Sig Start Date End Date Taking? Authorizing Provider  amLODipine (NORVASC) 2.5 MG tablet Take 2.5 mg by mouth every morning.    [provider]  ARIPiprazole (ABILIFY) 5 MG tablet Take 5 mg by mouth every morning.    [provider]  aspirin EC 81 MG tablet Take 81 mg by mouth every morning.    [provider]  atorvastatin (LIPITOR) 40 MG tablet Take 40 mg by mouth every morning.    [provider]  celecoxib (CELEBREX) 200 MG capsule Take 200 mg by mouth 2 (two) times daily.    [provider]  desvenlafaxine (PRISTIQ) 100 MG 24 hr tablet Take 100 mg by mouth every morning.    [provider]  gabapentin (NEURONTIN) 300 MG capsule Take 900 mg by mouth every morning.    [provider]  HYDROcodone-acetaminophen (NORCO/VICODIN) 5-325 MG tablet Take 1-2 tablets by mouth every 4 (four) hours as needed for moderate pain. 08/01/16   Jovita Gamma, MD  insulin aspart (NOVOLOG) 100 UNIT/ML injection Inject 3-15 Units into the skin 3  (three) times daily before meals.    [provider]  insulin glargine (LANTUS) 100 UNIT/ML injection Inject 20 Units into the skin at bedtime.     [provider]    Current Facility-Administered Medications  Medication Dose Route Frequency Provider Last Rate Last Admin  . [START ON 08/21/2019] 0.9 %  sodium chloride infusion  250 mL Intravenous PRN Leonie Man, MD      . 0.9% sodium chloride infusion  1 mL/kg/hr Intravenous Continuous Leonie Man, MD 62.6 mL/hr at 08/20/19 1654 1 mL/kg/hr at 08/20/19 1654  . acetaminophen (TYLENOL) tablet 650 mg  650 mg Oral Q4H PRN Leonie Man, MD      . Derrill Memo ON 08/21/2019] amLODipine (NORVASC) tablet 2.5 mg  2.5 mg Oral Dorris Singh, MD      . Derrill Memo ON  08/21/2019] ARIPiprazole (ABILIFY) tablet 5 mg  5 mg Oral Dorris Singh, MD      . Derrill Memo ON 08/21/2019] aspirin EC tablet 81 mg  81 mg Oral Dorris Singh, MD      . atorvastatin (LIPITOR) tablet 80 mg  80 mg Oral Daily Leonie Man, MD      . Derrill Memo ON 08/21/2019] desvenlafaxine (PRISTIQ) 24 hr tablet 100 mg  100 mg Oral Dorris Singh, MD      . Derrill Memo ON 08/21/2019] gabapentin (NEURONTIN) capsule 900 mg  900 mg Oral Dorris Singh, MD      . heparin ADULT infusion 100 units/mL (25000 units/271m sodium chloride 0.45%)  900 Units/hr Intravenous Continuous RKarren Cobble RPH      . hydrALAZINE (APRESOLINE) injection 10 mg  10 mg Intravenous Q20 Min PRN HLeonie Man MD      . HYDROcodone-acetaminophen (NORCO/VICODIN) 5-325 MG per tablet 1-2 tablet  1-2 tablet Oral Q4H PRN HLeonie Man MD      . [Derrill MemoON 08/21/2019] insulin aspart (novoLOG) injection 0-15 Units  0-15 Units Subcutaneous TID WC HLeonie Man MD      . [Derrill MemoON 08/21/2019] insulin aspart (novoLOG) injection 3-15 Units  3-15 Units Subcutaneous TID AC HLeonie Man MD      . insulin glargine (LANTUS) injection 20 Units  20 Units Subcutaneous  QHS HLeonie Man MD      . labetalol (NORMODYNE) injection 10 mg  10 mg Intravenous Q10 min PRN HLeonie Man MD      . metoprolol tartrate (LOPRESSOR) tablet 25 mg  25 mg Oral BID HLeonie Man MD      . nitroGLYCERIN 50 mg in dextrose 5 % 250 mL (0.2 mg/mL) infusion  0-200 mcg/min Intravenous Titrated HLeonie Man MD 3 mL/hr at 08/20/19 1656 10 mcg/min at 08/20/19 1656  . ondansetron (ZOFRAN) injection 4 mg  4 mg Intravenous Q6H PRN HLeonie Man MD      . [Derrill MemoON 08/21/2019] sodium chloride flush (NS) 0.9 % injection 3 mL  3 mL Intravenous Q12H HLeonie Man MD      . [Derrill MemoON 08/21/2019] sodium chloride flush (NS) 0.9 % injection 3 mL  3 mL Intravenous PRN HLeonie Man MD        No Known Allergies    Review of Systems:   General:  normal appetite, decreased energy, no weight gain, no weight loss, no fever  Cardiac:  no chest pain with exertion, + chest pain at rest, no SOB with exertion, no resting SOB, no PND, no orthopnea, no palpitations, no arrhythmia, no atrial fibrillation, no LE edema, no dizzy spells, no syncope  Respiratory:  no shortness of breath, no home oxygen, no productive cough, no dry cough, no bronchitis, no wheezing, no hemoptysis, no asthma, no pain with inspiration or cough, no sleep apnea, no CPAP at night  GI:   no difficulty swallowing, no reflux, no frequent heartburn, no hiatal hernia, no abdominal pain, + chronic constipation, no diarrhea, no hematochezia, no hematemesis, no melena  GU:   no dysuria,  no frequency, no urinary tract infection, no hematuria, no enlarged prostate, no kidney stones, no kidney disease  Vascular:  no pain suggestive of claudication, no pain in feet, no leg cramps, no varicose veins, no DVT, no non-healing foot ulcer  Neuro:   no stroke, no TIA's, no seizures, no headaches, no temporary blindness  one eye,  no slurred speech, no peripheral neuropathy, + chronic pain, no instability of gait, no  memory/cognitive dysfunction  Musculoskeletal: + arthritis - primarily involving the right hip, no joint swelling, no myalgias, no difficulty walking, normal mobility   Skin:   no rash, no itching, no skin infections, no pressure sores or ulcerations  Psych:   no anxiety, no depression, no nervousness, no unusual recent stress  Eyes:   no blurry vision, no floaters, no recent vision changes, + wears glasses or contacts  ENT:   no hearing loss, no loose or painful teeth, no dentures, last saw dentist within the past year  Hematologic:  no easy bruising, no abnormal bleeding, no clotting disorder, no frequent epistaxis  Endocrine:  + diabetes, does check CBG's at home, typically 3x/day     Physical Exam:   BP (!) 111/50   Pulse 67   Resp 14   Ht '5\' 4"'  (1.626 m)   Wt 62.6 kg   SpO2 95%   BMI 23.69 kg/m   General:    well-appearing  HEENT:  Unremarkable   Neck:   no JVD, no bruits, no adenopathy   Chest:   clear to auscultation, symmetrical breath sounds, no wheezes, no rhonchi   CV:   RRR, no murmur   Abdomen:  soft, non-tender, no masses   Extremities:  warm, well-perfused, pulses diminished but palpable, no lower extremity edema  Rectal/GU  Deferred  Neuro:   Grossly non-focal and symmetrical throughout  Skin:   Clean and dry, no rashes, no breakdown  Diagnostic Tests:  Lab Results: No results for input(s): WBC, HGB, HCT, PLT in the last 72 hours. BMET: No results for input(s): NA, K, CL, CO2, GLUCOSE, BUN, CREATININE, CALCIUM in the last 72 hours.  CBG (last 3)  No results for input(s): GLUCAP in the last 72 hours. PT/INR:  No results for input(s): LABPROT, INR in the last 72 hours.  CXR:  N/A   LEFT HEART CATH AND CORONARY ANGIOGRAPHY  Conclusion   Mid LAD lesion is 95% stenosed.  Prox Cx lesion is 85% stenosed.  Ramus-1 lesion is 70% stenosed.  Ramus-2 lesion is 80% stenosed.  Prox Cx to Mid Cx lesion is 70% stenosed.  Ramus-3 lesion is 90% stenosed.  Prox  RCA-1 lesion is 30% stenosed.  Prox RCA-2 lesion is 30% stenosed.  Mid RCA lesion is 70% stenosed.  RPDA lesion is 70% stenosed.  The left ventricular ejection fraction is 25-35% by visual estimate.  LV end diastolic pressure is mildly elevated.  There is moderate to severe left ventricular systolic dysfunction.  There is no aortic valve stenosis.   Recommendations  Antiplatelet/Anticoag Recommend Aspirin 36m daily for moderate CAD. Patient has multivessel CAD, recommend CVTS consultation for CABG as he is a diabetic with multivessel disease. Would likely recommend clopidogrel following CABG  We will start IV heparin 8 hours after sheath removal and initiate IV nitroglycerin infusion. Start low-dose beta-blocker and continue home dose of amlodipine Home dose Lantus with sliding scale insulin and meal coverage per home regimen. He is on several psychotropic meds which have been continued.  Discharge Date Anticipated discharge date to be determined.  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 KAgustin Cree Mr. SMijangosis a 64year [facility]  Dr. Agustin Cree.  Just over a week ago the patient had an episode lasting 15 to 20 minutes where he had some chest discomfort, dyspnea and diaphoresis that woke him up from sleep.  It got better after taking some aspirin and he went back to sleep.  This morning he went to his primary care doctor's office for routine evaluation, when he told the story, an EKG was checked and showed some anterior ST elevations with Q waves.  He was sent emergently to [facility] ER where he was found to have elevated troponin levels of greater than 10, and persistent almost evolutionary  changes of recent anterior MI.  Echocardiogram suggested significantly reduced EF with anteroapical LV aneurysm.  He was seen evaluated by Dr. Agustin Cree who recommended transfer to [facility] for further evaluation including cardiac catheterization.  Time Out: Verified patient identification, verified procedure, site/side was marked, verified correct patient position, special equipment/implants available, medications/allergies/relevent history reviewed, required imaging and test results available. Performed.  Access:  Right Radial Artery: 6 Fr sheath -- Seldinger technique using Micropuncture Kit --> despite several attempts with excellent wire advancement, I was not able to advance the catheter more than 2 inches into the forearm, therefore I chose to abort radial access and switch to femoral -- Direct ultrasound guidance used.  Permanent image obtained and placed on chart.  *Right Common Femoral Artery: 6 Fr Sheath - fluoroscopically guided modified Seldinger Technique  -- Direct ultrasound guidance used.  Permanent image obtained and placed on chart.  Left Heart Catheterization: 5 Fr Catheters advanced or exchanged over a J-wire under direct fluoroscopic guidance into the ascending aorta; JL4 catheter advanced first.  * LV Hemodynamics (LV Gram): JR4 catheter * Left Coronary Artery Cineangiography: JL4 catheter  * Right Coronary Artery  Cineangiography: JR4 catheter   Review of initial angiography revealed: Severe multivessel CAD.  Images were reviewed with Dr. Angelena Form and we both agree that his best treatment option would be CVTS consultation for CABG  Upon completion of Angiogaphy, the catheter was removed completely out of the body over a wire, without complication.  Femoral / Brachial Sheath(s) removed in the Cath Lab with manual pressure for hemostasis.   Radial sheath removed in the Cardiac Catheterization lab with TR Band placed for hemostasis.  TR Band: 1540 Hours; 13 mL  air  MEDICATIONS SQ Lidocaine 3 mL for radial access and 15 mL for femoral access Estimated blood loss <50 mL.   During this procedure medications were administered to achieve and maintain moderate conscious sedation while the patient's heart rate, blood pressure, and oxygen saturation were continuously monitored and I was present face-to-face 100% of this time.  Medications (Filter: Administrations occurring from 1456 to 1636 on 08/20/19) (important) Continuous medications are totaled by the amount administered until 08/20/19 1636.  midazolam (VERSED) injection (mg) Total dose:  1 mg Date/Time  Rate/Dose/Volume Action  08/20/19 1525  1 mg Given    fentaNYL (SUBLIMAZE) injection (mcg) Total dose:  25 mcg Date/Time  Rate/Dose/Volume Action  08/20/19 1525  25 mcg Given    lidocaine (PF) (XYLOCAINE) 1 % injection (mL) Total volume:  17 mL Date/Time  Rate/Dose/Volume Action  08/20/19 1529  2 mL Given  1542  15 mL Given    Heparin (Porcine) in NaCl 1000-0.9 UT/500ML-% SOLN (mL) Total volume:  1,000 mL Date/Time  Rate/Dose/Volume Action  08/20/19 1529  500 mL Given  1529  500 mL Given    iohexol (OMNIPAQUE) 350 MG/ML injection (mL) Total volume:  50 mL Date/Time  Rate/Dose/Volume Action  08/20/19 1609  50 mL Given    Sedation Time  Sedation Time Physician-1: 38 minutes 23 seconds  Contrast  Medication Name Total Dose  iohexol (OMNIPAQUE) 350 MG/ML injection 50 mL    Radiation/Fluoro  Fluoro time: 4.6 (min) DAP: 8554 (mGycm2) Cumulative Air Kerma: 144 (mGy)  Complications  No complications were associated with this study.  Documented by Leonie Man, MD - 08/20/2019 5:35 PM    Coronary Findings  Diagnostic Dominance: Right Left Anterior Descending  There is mild diffuse disease throughout the vessel.  Mid LAD lesion is 95% stenosed. Vessel is the culprit lesion. The lesion is located proximal to the major branch, focal, discrete and concentric.  First  Diagonal Branch  Vessel is small in size. There is moderate disease in the vessel.  First Septal Branch  Major Septal trunk - several branches  Second Septal Branch  Vessel is small in size.  Third Diagonal Branch  Vessel is small in size.  Ramus Intermedius  Vessel is large. There is moderate diffuse disease throughout the vessel.  Ramus-1 lesion is 70% stenosed. The lesion is segmental, eccentric and irregular.  Ramus-2 lesion is 80% stenosed. The lesion is segmental, eccentric and irregular.  Ramus-3 lesion is 90% stenosed. The lesion is segmental and eccentric.  Left Circumflex  Vessel is small. There is mild diffuse disease throughout the vessel.  Prox Cx lesion is 85% stenosed. The lesion is located at the bend, focal and discrete.  Prox Cx to Mid Cx lesion is 70% stenosed. The lesion is segmental and concentric.  First Obtuse Marginal Branch  Vessel is small in size.  Left Posterior Atrioventricular Artery  Vessel is small in size.  Right Coronary Artery  There is mild diffuse disease throughout the vessel.  Prox RCA-1 lesion is 30% stenosed.  Prox RCA-2 lesion is 30% stenosed.  Mid RCA lesion is 70% stenosed. The lesion is focal and concentric.  Acute Marginal Branch  Vessel is small in size.  Right Ventricular Branch  Vessel is small in size.  Right Posterior Descending Artery  Vessel is small in size.  RPDA lesion is 70% stenosed. The lesion is focal and concentric.  First Right Posterolateral Branch  Vessel is small in size.  Intervention  No interventions have been documented. Wall Motion  Resting       Borderline aneurysmal dilation          Left Heart  Left Ventricle There is moderate to severe left ventricular systolic dysfunction. LV end diastolic pressure is mildly elevated. The left ventricular ejection fraction is 25-35% by visual estimate. There are LV function abnormalities due to segmental dysfunction.  Aortic Valve There is no aortic valve  stenosis.  Coronary Diagrams  Diagnostic Dominance: Right  Intervention     Impression:  Patient has severe three-vessel coronary artery disease with moderate to severe left ventricular systolic dysfunction and may have recently suffered an acute out of hospital ST segment elevation myocardial infarction.  He describes several episodes of chest pain radiating to the left shoulder over the last 2 weeks, the most severe of which occurred approximately 2 weeks ago and lasted more than 15 minutes.  He denies any significant symptoms of shortness of breath or other signs of congestive heart failure and he has not had any chest discomfort for nearly a week.  I have personally reviewed the patient's diagnostic cardiac catheterization performed this afternoon by Dr. Ellyn Hack.  Catheterization reveals diffuse multivessel  coronary artery disease with high-grade stenosis in all 3 vascular territories including 95% stenosis of mid left anterior descending coronary artery which may have been the culprit vessel for the patient's recent acute myocardial infarction.  Left ventricular systolic function is severely reduced with severe hypokinesis or akinesis of the distal anterior wall and apex.  Left ventricular end-diastolic pressure was "mildly elevated".  I agree the patient would best be treated with surgical revascularization.   Plan:  I have reviewed the indications, risks, and potential benefits of coronary artery bypass grafting with the patient and his family at the bedside.  Alternative treatment strategies have been discussed, including the relative risks, benefits and long term prognosis associated with medical therapy, percutaneous coronary intervention, and surgical revascularization.  The patient understands and accepts all potential associated risks of surgery including but not limited to risk of death, stroke or other neurologic complication, myocardial infarction, congestive heart failure,  respiratory failure, renal failure, bleeding requiring blood transfusion and/or reexploration, aortic dissection or other major vascular complication, arrhythmia, heart block or bradycardia requiring permanent pacemaker, pneumonia, pleural effusion, wound infection, pulmonary embolus or other thromboembolic complication, chronic pain or other delayed complications related to median sternotomy, or the late recurrence of symptomatic ischemic heart disease and/or congestive heart failure.  The importance of long term risk modification have been emphasized.  All questions answered.  We tentatively plan for elective coronary artery bypass grafting on Tuesday, August 24, 2019 by one of my partners, Dr. Orvan Seen.     I spent in excess of 120 minutes during the conduct of this hospital consultation and >50% of this time involved direct face-to-face encounter for counseling and/or coordination of the patient's care.    Valentina Gu. Roxy Manns, MD 08/20/2019 7:11 PM

## 2019-08-20 NOTE — Interval H&P Note (Signed)
History and Physical Interval Note:  08/20/2019 3:22 PM  Jonathan Holder  has presented today for surgery, with the diagnosis of Likely Delayed presentation of Anterior STEMI.  The various methods of treatment have been discussed with the patient and family. After consideration of risks, benefits and other options for treatment, the patient has consented to  Procedure(s): LEFT HEART CATH AND CORONARY ANGIOGRAPHY (N/A)  PERCUTANEOUS CORONARY INTERVENTION  as a surgical intervention.  The patient's history has been reviewed, patient examined, no change in status, stable for surgery.  I have reviewed the patient's chart and labs.  Questions were answered to the patient's satisfaction.     Bryan Lemma

## 2019-08-20 NOTE — H&P (View-Only) (Signed)
    Pt seen by Dr. Krasowski at [facility] and transferred to [facility] for LHC 08/20/19 for NSTEMI. Please see paper chart to be scanned at patient discharge for full details.  ° °In summary, he is a 64yo with DM1, HTN, hx of PE and dyslipidemia who presented to his PCP for a regular checkup at which time an EKG was performed that showed ST segment elevation in multiple leads therefore he was advised to go to the ED for further evaluation. Troponin found to be 10.7 at which time cardiology was consulted. He stated that two weeks ago he had an episode of chest pain in the middle of the night with associated diaphoresis with pain radiation to the left arm. ° °Teiana Hajduk NP-C °[facility] °Pager: [phone] ° °

## 2019-08-20 NOTE — Progress Notes (Signed)
CRITICAL VALUE ALERT  Critical Value:  Troponin 4189  Date & Time Notied:  08/20/2019 20:20  Provider Notified: Provider aware of previously elevated troponin                              Troponin trending down, patient with no report                                 Of chest pain or discomfort.  Orders Received/Actions taken: Will continue to monitor patient

## 2019-08-20 NOTE — Progress Notes (Signed)
ANTICOAGULATION CONSULT NOTE - Initial Consult  Pharmacy Consult for Heparin Indication: MV CAD  No Known Allergies  Patient Measurements: Height: 5\' 4"  (162.6 cm) Weight: 62.6 kg (138 lb) IBW/kg (Calculated) : 59.2 Heparin Dosing Weight:  62.6  kg  Vital Signs: BP: 111/50 (07/16 1740) Pulse Rate: 67 (07/16 1740)  Labs: No results for input(s): HGB, HCT, PLT, APTT, LABPROT, INR, HEPARINUNFRC, HEPRLOWMOCWT, CREATININE, CKTOTAL, CKMB, TROPONINIHS in the last 72 hours.  CrCl cannot be calculated (Patient's most recent lab result is older than the maximum 21 days allowed.).   Medical History: Past Medical History:  Diagnosis Date  . Anxiety   . Arthritis   . Depression   . Diabetes mellitus without complication (HCC)   . History of kidney stones   . Hypertension     Assessment: CC/HPI: NSTEMI  PMH: DM1, HTN, hx of PE and dyslipidemia   Anticoag: NSTEMI s/p cath 7/16 with MV CAD.. Hgb 15.1. Plts 190  Goal of Therapy:  Heparin level 0.3-0.7 units/ml Monitor platelets by anticoagulation protocol: Yes   Plan:  Sheath out 1537. Start IV heparin (no bolus) at 900 units/hr starting at 2330 Daily HL and CBC CVTS consult.  Jonathan Holder S. 8/16, PharmD, BCPS Clinical Staff Pharmacist [email or web address] Merilynn Finland, Cindy Fullman Stillinger 08/20/2019,6:28 PM

## 2019-08-21 ENCOUNTER — Inpatient Hospital Stay (HOSPITAL_COMMUNITY): Payer: BC Managed Care – PPO

## 2019-08-21 DIAGNOSIS — I220 Subsequent ST elevation (STEMI) myocardial infarction of anterior wall: Secondary | ICD-10-CM | POA: Diagnosis not present

## 2019-08-21 LAB — COMPREHENSIVE METABOLIC PANEL
ALT: 9 U/L (ref 0–44)
AST: 13 U/L — ABNORMAL LOW (ref 15–41)
Albumin: 2.8 g/dL — ABNORMAL LOW (ref 3.5–5.0)
Alkaline Phosphatase: 73 U/L (ref 38–126)
Anion gap: 9 (ref 5–15)
BUN: 16 mg/dL (ref 8–23)
CO2: 28 mmol/L (ref 22–32)
Calcium: 9.3 mg/dL (ref 8.9–10.3)
Chloride: 106 mmol/L (ref 98–111)
Creatinine, Ser: 1.21 mg/dL (ref 0.61–1.24)
GFR calc Af Amer: >60 mL/min (ref >60)
GFR calc non Af Amer: >60 mL/min (ref >60)
Glucose, Bld: 127 mg/dL — ABNORMAL HIGH (ref 70–99)
Potassium: 4.3 mmol/L (ref 3.5–5.1)
Sodium: 143 mmol/L (ref 135–145)
Total Bilirubin: 0.5 mg/dL (ref 0.3–1.2)
Total Protein: 6.3 g/dL — ABNORMAL LOW (ref 6.5–8.1)

## 2019-08-21 LAB — CBC
HCT: 35.4 % — ABNORMAL LOW (ref 39.0–52.0)
Hemoglobin: 11 g/dL — ABNORMAL LOW (ref 13.0–17.0)
MCH: 30.1 pg (ref 26.0–34.0)
MCHC: 31.1 g/dL (ref 30.0–36.0)
MCV: 97 fL (ref 80.0–100.0)
Platelets: 270 10*3/uL (ref 150–400)
RBC: 3.65 MIL/uL — ABNORMAL LOW (ref 4.22–5.81)
RDW: 13.5 % (ref 11.5–15.5)
WBC: 5.9 10*3/uL (ref 4.0–10.5)
nRBC: 0 % (ref 0.0–0.2)

## 2019-08-21 LAB — ECHOCARDIOGRAM COMPLETE
Area-P 1/2: 5.13 cm2
Calc EF: 38 %
Height: 64 in
S' Lateral: 3 cm
Single Plane A2C EF: 37.2 %
Single Plane A4C EF: 40.1 %
Weight: 2208 oz

## 2019-08-21 LAB — GLUCOSE, CAPILLARY
Glucose-Capillary: 129 mg/dL — ABNORMAL HIGH (ref 70–99)
Glucose-Capillary: 129 mg/dL — ABNORMAL HIGH (ref 70–99)
Glucose-Capillary: 116 mg/dL — ABNORMAL HIGH (ref 70–99)
Glucose-Capillary: 186 mg/dL — ABNORMAL HIGH (ref 70–99)

## 2019-08-21 LAB — BRAIN NATRIURETIC PEPTIDE: B Natriuretic Peptide: 513.2 pg/mL — ABNORMAL HIGH (ref 0.0–100.0)

## 2019-08-21 LAB — HEPARIN LEVEL (UNFRACTIONATED): Heparin Unfractionated: 0.25 IU/mL — ABNORMAL LOW (ref 0.30–0.70)

## 2019-08-21 LAB — LIPID PANEL
Cholesterol: 86 mg/dL (ref 0–200)
HDL: 30 mg/dL — ABNORMAL LOW (ref >40)
LDL Cholesterol: 48 mg/dL (ref 0–99)
Total CHOL/HDL Ratio: 2.9 RATIO
Triglycerides: 40 mg/dL (ref <150)
VLDL: 8 mg/dL (ref 0–40)

## 2019-08-21 LAB — APTT: aPTT: 70 seconds — ABNORMAL HIGH (ref 24–36)

## 2019-08-21 LAB — PROTIME-INR
INR: 1.3 — ABNORMAL HIGH (ref 0.8–1.2)
Prothrombin Time: 15.5 seconds — ABNORMAL HIGH (ref 11.4–15.2)

## 2019-08-21 LAB — PREALBUMIN: Prealbumin: 9.9 mg/dL — ABNORMAL LOW (ref 18–38)

## 2019-08-21 MED ORDER — PERFLUTREN LIPID MICROSPHERE
1.0000 mL | INTRAVENOUS | Status: AC | PRN
  Administered 2019-08-21: 2 mL via INTRAVENOUS
  Filled 2019-08-21: qty 10

## 2019-08-21 NOTE — Progress Notes (Signed)
Progress Note  Patient Name: Jonathan Holder Date of Encounter: 08/21/2019  [facility] Cardiologist: Dr Bing Matter  Subjective   Feels well this am. No chest pain or dyspnea.   Inpatient Medications    Scheduled Meds: . aspirin EC  81 mg Oral BH-q7a  . atorvastatin  80 mg Oral Daily  . desvenlafaxine  100 mg Oral BH-q7a  . gabapentin  900 mg Oral BH-q7a  . insulin aspart  0-15 Units Subcutaneous TID WC  . insulin glargine  20 Units Subcutaneous QHS  . metoprolol tartrate  25 mg Oral BID  . sodium chloride flush  3 mL Intravenous Q12H   Continuous Infusions: . sodium chloride    . heparin 900 Units/hr (08/20/19 2343)  . nitroGLYCERIN 10 mcg/min (08/20/19 1656)   PRN Meds: sodium chloride, acetaminophen, HYDROcodone-acetaminophen, ondansetron (ZOFRAN) IV, sodium chloride flush   Vital Signs    Vitals:   08/21/19 0029 08/21/19 0613 08/21/19 0739 08/21/19 0902  BP: (!) 95/58 111/62 127/66 124/64  Pulse: (!) 58 60 (!) 58 64  Resp: 17 17 18    Temp: 98.2 F (36.8 C) 98.3 F (36.8 C) 98 F (36.7 C)   TempSrc: Oral Oral Oral   SpO2: 95% 100% 99%   Weight:      Height:        Intake/Output Summary (Last 24 hours) at 08/21/2019 1026 Last data filed at 08/21/2019 0900 Gross per 24 hour  Intake 220 ml  Output --  Net 220 ml   Last 3 Weights 08/20/2019 07/31/2016 07/22/2016  Weight (lbs) 138 lb 165 lb 165 lb 8 oz  Weight (kg) 62.596 kg 74.844 kg 75.07 kg      Telemetry    NSR - Personally Reviewed  ECG    NSR. Low voltage. Septal infarct- recent - Personally Reviewed  Physical Exam   GEN: No acute distress.   Neck: No JVD Cardiac: RRR, no murmurs, rubs, or gallops.  Respiratory: Clear to auscultation bilaterally. GI: Soft, nontender, non-distended  MS: No edema; No deformity. Right radial site without hematoma.  Neuro:  Nonfocal  Psych: Normal affect   Labs    High Sensitivity Troponin:   Recent Labs  Lab 08/20/19 1910 08/20/19 2016  TROPONINIHS  4,189* 4,969*      Chemistry Recent Labs  Lab 08/21/19 0757  NA 143  K 4.3  CL 106  CO2 28  GLUCOSE 127*  BUN 16  CREATININE 1.21  CALCIUM 9.3  PROT 6.3*  ALBUMIN 2.8*  AST 13*  ALT 9  ALKPHOS 73  BILITOT 0.5  GFRNONAA >60  GFRAA >60  ANIONGAP 9     Hematology Recent Labs  Lab 08/21/19 0757  WBC 5.9  RBC 3.65*  HGB 11.0*  HCT 35.4*  MCV 97.0  MCH 30.1  MCHC 31.1  RDW 13.5  PLT 270    BNP Recent Labs  Lab 08/21/19 0757  BNP 513.2*     DDimer No results for input(s): DDIMER in the last 168 hours.   Radiology    CARDIAC CATHETERIZATION  Result Date: 08/20/2019  Mid LAD lesion is 95% stenosed.  Prox Cx lesion is 85% stenosed.  Ramus-1 lesion is 70% stenosed.  Ramus-2 lesion is 80% stenosed.  Prox Cx to Mid Cx lesion is 70% stenosed.  Ramus-3 lesion is 90% stenosed.  Prox RCA-1 lesion is 30% stenosed.  Prox RCA-2 lesion is 30% stenosed.  Mid RCA lesion is 70% stenosed.  RPDA lesion is 70% stenosed.  The left ventricular  ejection fraction is 25-35% by visual estimate.  LV end diastolic pressure is mildly elevated.  There is moderate to severe left ventricular systolic dysfunction.  There is no aortic valve stenosis.     Cardiac Studies   LEFT HEART CATH AND CORONARY ANGIOGRAPHY  Conclusion   Mid LAD lesion is 95% stenosed.  Prox Cx lesion is 85% stenosed.  Ramus-1 lesion is 70% stenosed.  Ramus-2 lesion is 80% stenosed.  Prox Cx to Mid Cx lesion is 70% stenosed.  Ramus-3 lesion is 90% stenosed.  Prox RCA-1 lesion is 30% stenosed.  Prox RCA-2 lesion is 30% stenosed.  Mid RCA lesion is 70% stenosed.  RPDA lesion is 70% stenosed.  The left ventricular ejection fraction is 25-35% by visual estimate.  LV end diastolic pressure is mildly elevated.  There is moderate to severe left ventricular systolic dysfunction.  There is no aortic valve stenosis.     Patient Profile     64 y.o. male with DM1, HTN, hx of PE and  dyslipidemia who presented to his PCP for a regular checkup at which time an EKG was performed that showed ST segment elevation in multiple leads therefore he was advised to go to the ED for further evaluation. Troponin found to be 10.7 at which time cardiology was consulted. He stated that two weeks ago he had an episode of chest pain in the middle of the night with associated diaphoresis with pain radiation to the left arm.  Assessment & Plan    1. CAD with recent anteroseptal infarct. Residual chest pain. Cardiac cath reveals severe 3 vessel CAD with reduced EF 25-35%. Now pain free on IV Ntg and heparin. On ASA, high dose statin, and metoprolol. Seen by Dr Cornelius Moras and plan CABG on Tuesday. Echo and carotid dopplers pending. 2. HLD well controlled on high dose statin. 3. DM on SSI  For questions or updates, please contact [facility] Please consult [email or web address] for contact info under        Signed, Shateria Paternostro [place], MD  08/21/2019, 10:26 AM

## 2019-08-21 NOTE — Progress Notes (Signed)
ANTICOAGULATION CONSULT NOTE  Pharmacy Consult for Heparin Indication: MV CAD  No Known Allergies  Patient Measurements: Height: 5\' 4"  (162.6 cm) Weight: 62.6 kg (138 lb) IBW/kg (Calculated) : 59.2 Heparin Dosing Weight:  62.6  kg  Vital Signs: Temp: 98.4 F (36.9 C) (07/17 1143) Temp Source: Oral (07/17 1143) BP: 107/44 (07/17 1143) Pulse Rate: 58 (07/17 1143)  Labs: Recent Labs    08/20/19 1910 08/20/19 2016 08/21/19 0757  HGB  --   --  11.0*  HCT  --   --  35.4*  PLT  --   --  270  APTT  --   --  70*  LABPROT  --   --  15.5*  INR  --   --  1.3*  HEPARINUNFRC  --   --  0.25*  CREATININE  --   --  1.21  TROPONINIHS 4,189* 4,969*  --     Estimated Creatinine Clearance: 52.3 mL/min (by C-G formula based on SCr of 1.21 mg/dL).   Medical History: Past Medical History:  Diagnosis Date  . Anxiety   . Arthritis   . Depression   . Diabetes mellitus without complication (HCC)   . History of kidney stones   . Hypertension     Assessment:  64 yo male with CAD and recent anteroseptal infarct. Cardiac cath revealed severe 3 vessel CAD with reduced EF of 25-35%. Per cardiology, the patient is to undergo a CABG on 7/20.  Pharmacy was consulted to dose IV heparin.   The patient was started on IV heparin (no bolus) at 900 units/hr. A heparin level was drawn 8-hours later and was subtherapeutic at 0.25. Will increase the rate as a result. Patient is without bleeding documented.  Goal of Therapy:  Heparin level 0.3-0.7 units/ml Monitor platelets by anticoagulation protocol: Yes   Plan:  Increase heparin IV rate to 1000 units/hr Daily heparin level and CBC  8/20, PharmD, RPh  PGY-1 Pharmacy Resident 08/21/2019 12:15 PM  Please check [email or web address] for unit-specific pharmacy phone numbers.

## 2019-08-21 NOTE — Progress Notes (Signed)
  Echocardiogram 2D Echocardiogram with definity has been performed.  Jonathan Holder M 08/21/2019, 11:48 AM

## 2019-08-21 NOTE — Progress Notes (Signed)
[phone] Gave pt IS and he demonstrated 2000 ml correctly. Discussed importance of IS and mobility after surgery. Reviewed sternal precautions and staying in the tube. Wrote down how to view pre op video and gave OHS booklet and care guide. Pt stated wife will be available to assist with care after discharge. Encouraged pt to walk with staff later if no CP. He is getting ready to order breakfast. Voiced understanding of ed. Luetta Nutting RN BSN 08/21/2019 9:07 AM

## 2019-08-22 ENCOUNTER — Inpatient Hospital Stay (HOSPITAL_COMMUNITY): Payer: BC Managed Care – PPO

## 2019-08-22 DIAGNOSIS — Z01810 Encounter for preprocedural cardiovascular examination: Secondary | ICD-10-CM | POA: Diagnosis not present

## 2019-08-22 LAB — GLUCOSE, CAPILLARY
Glucose-Capillary: 91 mg/dL (ref 70–99)
Glucose-Capillary: 214 mg/dL — ABNORMAL HIGH (ref 70–99)
Glucose-Capillary: 208 mg/dL — ABNORMAL HIGH (ref 70–99)

## 2019-08-22 LAB — CBC
HCT: 34.3 % — ABNORMAL LOW (ref 39.0–52.0)
Hemoglobin: 10.9 g/dL — ABNORMAL LOW (ref 13.0–17.0)
MCH: 30.4 pg (ref 26.0–34.0)
MCHC: 31.8 g/dL (ref 30.0–36.0)
MCV: 95.8 fL (ref 80.0–100.0)
Platelets: 241 10*3/uL (ref 150–400)
RBC: 3.58 MIL/uL — ABNORMAL LOW (ref 4.22–5.81)
RDW: 13.5 % (ref 11.5–15.5)
WBC: 6 10*3/uL (ref 4.0–10.5)
nRBC: 0 % (ref 0.0–0.2)

## 2019-08-22 LAB — HEPARIN LEVEL (UNFRACTIONATED): Heparin Unfractionated: 0.57 IU/mL (ref 0.30–0.70)

## 2019-08-22 NOTE — Progress Notes (Addendum)
Progress Note  Patient Name: Jonathan Holder Date of Encounter: 08/22/2019  [facility] Cardiologist: Gypsy Balsam, MD   Subjective   He is not having any chest discomfort or shortness of breath.    Inpatient Medications    Scheduled Meds: . aspirin EC  81 mg Oral BH-q7a  . atorvastatin  80 mg Oral Daily  . desvenlafaxine  100 mg Oral BH-q7a  . gabapentin  900 mg Oral BH-q7a  . insulin aspart  0-15 Units Subcutaneous TID WC  . insulin glargine  20 Units Subcutaneous QHS  . metoprolol tartrate  25 mg Oral BID  . sodium chloride flush  3 mL Intravenous Q12H   Continuous Infusions: . sodium chloride    . heparin 1,000 Units/hr (08/22/19 0223)  . nitroGLYCERIN 10 mcg/min (08/20/19 1656)   PRN Meds: sodium chloride, acetaminophen, HYDROcodone-acetaminophen, ondansetron (ZOFRAN) IV, sodium chloride flush   Vital Signs    Vitals:   08/21/19 2025 08/21/19 2304 08/22/19 0541 08/22/19 0805  BP: 107/62 102/62 109/60 (!) 111/49  Pulse: 61 (!) 53 (!) 57 61  Resp: 17 16 18 16   Temp: 98.5 F (36.9 C) 98.5 F (36.9 C) 98.6 F (37 C) 98.9 F (37.2 C)  TempSrc: Oral Oral Oral Oral  SpO2: 100% 100% 100% 99%  Weight:   61 kg   Height:        Intake/Output Summary (Last 24 hours) at 08/22/2019 0825 Last data filed at 08/22/2019 0701 Gross per 24 hour  Intake 460 ml  Output 800 ml  Net -340 ml   Last 3 Weights 08/22/2019 08/20/2019 07/31/2016  Weight (lbs) 134 lb 7.7 oz 138 lb 165 lb  Weight (kg) 61 kg 62.596 kg 74.844 kg      Telemetry    Normal sinus rhythm  - Personally Reviewed    Physical Exam   GEN: No acute distress.   Neck: No JVD Cardiac: RRR, no murmurs, rubs, or gallops.  Respiratory: Clear to auscultation bilaterally. GI: Soft, nontender, non-distended  MS: No edema; No deformity. Neuro:  Nonfocal  Psych: Normal affect   Labs    High Sensitivity Troponin:   Recent Labs  Lab 08/20/19 1910 08/20/19 2016  TROPONINIHS 4,189* 4,969*       Chemistry Recent Labs  Lab 08/21/19 0757  NA 143  K 4.3  CL 106  CO2 28  GLUCOSE 127*  BUN 16  CREATININE 1.21  CALCIUM 9.3  PROT 6.3*  ALBUMIN 2.8*  AST 13*  ALT 9  ALKPHOS 73  BILITOT 0.5  GFRNONAA >60  GFRAA >60  ANIONGAP 9     Hematology Recent Labs  Lab 08/21/19 0757 08/22/19 0529  WBC 5.9 6.0  RBC 3.65* 3.58*  HGB 11.0* 10.9*  HCT 35.4* 34.3*  MCV 97.0 95.8  MCH 30.1 30.4  MCHC 31.1 31.8  RDW 13.5 13.5  PLT 270 241    BNP Recent Labs  Lab 08/21/19 0757  BNP 513.2*     DDimer No results for input(s): DDIMER in the last 168 hours.   Radiology    DG Chest 2 View  08/21/2019 IMPRESSION: Negative for acute cardiopulmonary disease. Electronically Signed   By: 08/23/2019 D.O.   On: 08/21/2019 15:25     Cardiac Studies   CARDIAC CATHETERIZATION  08/20/2019  Mid LAD lesion is 95% stenosed.  Prox Cx lesion is 85% stenosed.  Ramus-1 lesion is 70% stenosed.  Ramus-2 lesion is 80% stenosed.  Prox Cx to Mid Cx lesion is 70%  stenosed.  Ramus-3 lesion is 90% stenosed.  Prox RCA-1 lesion is 30% stenosed.  Prox RCA-2 lesion is 30% stenosed.  Mid RCA lesion is 70% stenosed.  RPDA lesion is 70% stenosed.  The left ventricular ejection fraction is 25-35% by visual estimate.  LV end diastolic pressure is mildly elevated.  There is moderate to severe left ventricular systolic dysfunction.  There is no aortic valve stenosis.    ECHOCARDIOGRAM COMPLETE  08/21/2019 IMPRESSIONS   1. LVEF is depressed with severe hypokinesis/akinesis of the distal 2/3 of LV.Marland Kitchen Left ventricular ejection fraction, by estimation, is 25 to 30%. The left ventricle has severely decreased function. The left ventricle demonstrates regional wall motion abnormalities (see scoring diagram/findings for description). Left ventricular diastolic parameters are consistent with Grade II diastolic dysfunction (pseudonormalization).  2. Right ventricular systolic function is normal. The right  ventricular size is normal.   3. The mitral valve is normal in structure. Trivial mitral valve regurgitation.   4. The aortic valve is normal in structure. Aortic valve regurgitation is not visualized.    Patient Profile     64 y.o. male transfer from [name] with DM1, HTN, hx of PE and dyslipidemia who presented to his PCP for a regular checkup at which time an EKG was performed that showed ST segment elevation in multiple leads.  He was advised to go to the ED for further evaluation. Troponin found to be 10.7.  He reported an episode of chest pain in the middle of the night 2 weeks prior with associated diaphoresis with pain radiation to the left arm.  Cardiac catheterization this admission demonstrated 3 v CAD and EF 25-35%.  Plan is for CABG Tues 08/24/2019.  Assessment & Plan    1. Coronary artery disease  S/p recent OOH anteroseptal infarct.  3 vessel disease on cath.  Plan for CABG Tues.  He is stable without ongoing angina.  Continue ASA, statin, beta-blocker, IV NTG, IV Heparin.  2. 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  Volume status is stable.  Continue beta-blocker.  With pending surgery in the next few days and soft BP, will hold off on starting ACE inhibitor/angiotensin receptor blocker, spironolactone.  Will hopefully be able to titrate CHF meds post op.    3. Hyperlipidemia  Continue high intensity statin.    4. Diabetes mellitus  Continue basal insulin and SSI.    For questions or updates, please contact [facility] Please consult [email or web address] for contact info under        Signed, Tereso Newcomer, PA-C  08/22/2019, 8:25 AM     Attending note  Patient seen and discussed with PA Alben Spittle, I agree with his documentation. Significant CAD with plans for cath on Tuesday. Continue medical therapy over the weekend. Systolic dysfunction LVEF 25-30%, does not appear significantly volume overloaded.  Medical therapy limited by soft bp's, avoid any over aggressive titration prior to surgery.   Dina Rich MD

## 2019-08-22 NOTE — Progress Notes (Signed)
Pre-CABG doppler study completed.   See Cv Proc for preliminary results.   Jonathan Holder

## 2019-08-22 NOTE — Progress Notes (Signed)
ANTICOAGULATION CONSULT NOTE  Pharmacy Consult for Heparin Indication: MV CAD  No Known Allergies  Patient Measurements: Height: 5\' 4"  (162.6 cm) Weight: 61 kg (134 lb 7.7 oz) IBW/kg (Calculated) : 59.2 Heparin Dosing Weight:  62.6  kg  Vital Signs: Temp: 98.9 F (37.2 C) (07/18 0805) Temp Source: Oral (07/18 0805) BP: 111/49 (07/18 0851) Pulse Rate: 63 (07/18 0851)  Labs: Recent Labs    08/20/19 1910 08/20/19 2016 08/21/19 0757 08/22/19 0529 08/22/19 0715  HGB  --   --  11.0* 10.9*  --   HCT  --   --  35.4* 34.3*  --   PLT  --   --  270 241  --   APTT  --   --  70*  --   --   LABPROT  --   --  15.5*  --   --   INR  --   --  1.3*  --   --   HEPARINUNFRC  --   --  0.25*  --  0.57  CREATININE  --   --  1.21  --   --   TROPONINIHS 4,189* 4,969*  --   --   --     Estimated Creatinine Clearance: 52.3 mL/min (by C-G formula based on SCr of 1.21 mg/dL).   Medical History: Past Medical History:  Diagnosis Date  . Anxiety   . Arthritis   . Depression   . Diabetes mellitus without complication (HCC)   . History of kidney stones   . Hypertension     Assessment:  64 yo male with CAD and recent anteroseptal infarct. Cardiac cath revealed severe 3 vessel CAD. Patient is with reduced EF of 25-30%. Per cardiology, the patient is to undergo a CABG on 7/20.  Pharmacy was consulted to dose IV heparin.   Heparin level this morning is therapeutic at 0.57 while at 1000 units/hr. Patient is without signs or symptoms of bleeding documented and the CBC is in range.  Goal of Therapy:  Heparin level 0.3-0.7 units/ml Monitor platelets by anticoagulation protocol: Yes   Plan:  Continue heparin IV at 1000 units/hr Daily heparin level and CBC Monitor for signs and symptoms of bleeding  8/20, PharmD, RPh  PGY-1 Pharmacy Resident 08/22/2019 9:30 AM  Please check [email or web address] for unit-specific pharmacy phone numbers.

## 2019-08-23 ENCOUNTER — Other Ambulatory Visit: Payer: Self-pay

## 2019-08-23 ENCOUNTER — Encounter (HOSPITAL_COMMUNITY): Payer: Self-pay | Admitting: Cardiology

## 2019-08-23 DIAGNOSIS — E118 Type 2 diabetes mellitus with unspecified complications: Secondary | ICD-10-CM

## 2019-08-23 DIAGNOSIS — I10 Essential (primary) hypertension: Secondary | ICD-10-CM

## 2019-08-23 DIAGNOSIS — E1169 Type 2 diabetes mellitus with other specified complication: Secondary | ICD-10-CM

## 2019-08-23 DIAGNOSIS — E785 Hyperlipidemia, unspecified: Secondary | ICD-10-CM

## 2019-08-23 DIAGNOSIS — Z794 Long term (current) use of insulin: Secondary | ICD-10-CM

## 2019-08-23 LAB — GLUCOSE, CAPILLARY
Glucose-Capillary: 99 mg/dL (ref 70–99)
Glucose-Capillary: 125 mg/dL — ABNORMAL HIGH (ref 70–99)
Glucose-Capillary: 105 mg/dL — ABNORMAL HIGH (ref 70–99)
Glucose-Capillary: 187 mg/dL — ABNORMAL HIGH (ref 70–99)
Glucose-Capillary: 191 mg/dL — ABNORMAL HIGH (ref 70–99)
Glucose-Capillary: 152 mg/dL — ABNORMAL HIGH (ref 70–99)

## 2019-08-23 LAB — CBC
HCT: 36.3 % — ABNORMAL LOW (ref 39.0–52.0)
Hemoglobin: 11.3 g/dL — ABNORMAL LOW (ref 13.0–17.0)
MCH: 29.5 pg (ref 26.0–34.0)
MCHC: 31.1 g/dL (ref 30.0–36.0)
MCV: 94.8 fL (ref 80.0–100.0)
Platelets: 240 10*3/uL (ref 150–400)
RBC: 3.83 MIL/uL — ABNORMAL LOW (ref 4.22–5.81)
RDW: 13.3 % (ref 11.5–15.5)
WBC: 5.5 10*3/uL (ref 4.0–10.5)
nRBC: 0 % (ref 0.0–0.2)

## 2019-08-23 LAB — URINALYSIS, ROUTINE W REFLEX MICROSCOPIC
Bacteria, UA: NONE SEEN
Bilirubin Urine: NEGATIVE
Glucose, UA: >=500 mg/dL — AB
Hgb urine dipstick: NEGATIVE
Ketones, ur: NEGATIVE mg/dL
Leukocytes,Ua: NEGATIVE
Nitrite: NEGATIVE
Protein, ur: NEGATIVE mg/dL
Specific Gravity, Urine: 1.01 (ref 1.005–1.030)
pH: 6 (ref 5.0–8.0)

## 2019-08-23 LAB — BASIC METABOLIC PANEL
Anion gap: 9 (ref 5–15)
BUN: 17 mg/dL (ref 8–23)
CO2: 26 mmol/L (ref 22–32)
Calcium: 9.2 mg/dL (ref 8.9–10.3)
Chloride: 104 mmol/L (ref 98–111)
Creatinine, Ser: 1.19 mg/dL (ref 0.61–1.24)
GFR calc Af Amer: >60 mL/min (ref >60)
GFR calc non Af Amer: >60 mL/min (ref >60)
Glucose, Bld: 110 mg/dL — ABNORMAL HIGH (ref 70–99)
Potassium: 4.1 mmol/L (ref 3.5–5.1)
Sodium: 139 mmol/L (ref 135–145)

## 2019-08-23 LAB — TYPE AND SCREEN
ABO/RH(D): O POS
Antibody Screen: NEGATIVE

## 2019-08-23 LAB — SURGICAL PCR SCREEN
MRSA, PCR: NEGATIVE
Staphylococcus aureus: NEGATIVE

## 2019-08-23 LAB — HEPARIN LEVEL (UNFRACTIONATED): Heparin Unfractionated: 0.47 IU/mL (ref 0.30–0.70)

## 2019-08-23 LAB — SARS CORONAVIRUS 2 (TAT 6-24 HRS): SARS Coronavirus 2: NEGATIVE

## 2019-08-23 MED ORDER — BISACODYL 5 MG PO TBEC
5.0000 mg | DELAYED_RELEASE_TABLET | Freq: Once | ORAL | Status: AC
  Administered 2019-08-23: 5 mg via ORAL
  Filled 2019-08-23: qty 1

## 2019-08-23 MED ORDER — SODIUM CHLORIDE 0.9 % IV SOLN
1.5000 g | INTRAVENOUS | Status: AC
  Administered 2019-08-24: 1.5 g via INTRAVENOUS
  Filled 2019-08-23: qty 1.5

## 2019-08-23 MED ORDER — EPINEPHRINE HCL 5 MG/250ML IV SOLN IN NS
0.0000 ug/min | INTRAVENOUS | Status: AC
  Administered 2019-08-24: 1 ug/min via INTRAVENOUS
  Filled 2019-08-23: qty 250

## 2019-08-23 MED ORDER — MUPIROCIN 2 % EX OINT
1.0000 "application " | TOPICAL_OINTMENT | Freq: Two times a day (BID) | CUTANEOUS | Status: DC
  Administered 2019-08-23: 1 via NASAL
  Filled 2019-08-23: qty 22

## 2019-08-23 MED ORDER — TRANEXAMIC ACID 1000 MG/10ML IV SOLN
1.5000 mg/kg/h | INTRAVENOUS | Status: AC
  Administered 2019-08-24: 1.5 mg/kg/h via INTRAVENOUS
  Filled 2019-08-23: qty 25

## 2019-08-23 MED ORDER — SODIUM CHLORIDE 0.9 % IV SOLN
INTRAVENOUS | Status: DC
  Filled 2019-08-23: qty 30

## 2019-08-23 MED ORDER — SODIUM CHLORIDE 0.9 % IV SOLN
750.0000 mg | INTRAVENOUS | Status: AC
  Administered 2019-08-24: 750 mg via INTRAVENOUS
  Filled 2019-08-23: qty 750

## 2019-08-23 MED ORDER — PHENYLEPHRINE HCL-NACL 20-0.9 MG/250ML-% IV SOLN
30.0000 ug/min | INTRAVENOUS | Status: DC
  Filled 2019-08-23: qty 250

## 2019-08-23 MED ORDER — ACETAMINOPHEN 500 MG PO TABS
1000.0000 mg | ORAL_TABLET | Freq: Once | ORAL | Status: AC
  Administered 2019-08-23: 1000 mg via ORAL
  Filled 2019-08-23: qty 2

## 2019-08-23 MED ORDER — PLASMA-LYTE 148 IV SOLN
INTRAVENOUS | Status: DC
  Filled 2019-08-23: qty 2.5

## 2019-08-23 MED ORDER — MAGNESIUM SULFATE 50 % IJ SOLN
40.0000 meq | INTRAMUSCULAR | Status: DC
  Filled 2019-08-23: qty 9.85

## 2019-08-23 MED ORDER — NOREPINEPHRINE 4 MG/250ML-% IV SOLN
0.0000 ug/min | INTRAVENOUS | Status: AC
  Administered 2019-08-24: 2 ug/min via INTRAVENOUS
  Filled 2019-08-23: qty 250

## 2019-08-23 MED ORDER — TRANEXAMIC ACID (OHS) BOLUS VIA INFUSION
15.0000 mg/kg | INTRAVENOUS | Status: AC
  Administered 2019-08-24: 945 mg via INTRAVENOUS
  Filled 2019-08-23: qty 945

## 2019-08-23 MED ORDER — CHLORHEXIDINE GLUCONATE CLOTH 2 % EX PADS: 6.0000 | MEDICATED_PAD | Freq: Once | CUTANEOUS | Status: DC

## 2019-08-23 MED ORDER — METOPROLOL TARTRATE 12.5 MG HALF TABLET: 12.5000 mg | ORAL_TABLET | Freq: Once | ORAL | Status: DC

## 2019-08-23 MED ORDER — VANCOMYCIN HCL 1250 MG/250ML IV SOLN
1250.0000 mg | INTRAVENOUS | Status: AC
  Administered 2019-08-24: 1250 mg via INTRAVENOUS
  Filled 2019-08-23: qty 250

## 2019-08-23 MED ORDER — INSULIN REGULAR(HUMAN) IN NACL 100-0.9 UT/100ML-% IV SOLN
INTRAVENOUS | Status: AC
  Administered 2019-08-24: 1.1 [IU]/h via INTRAVENOUS
  Filled 2019-08-23: qty 100

## 2019-08-23 MED ORDER — POTASSIUM CHLORIDE 2 MEQ/ML IV SOLN
80.0000 meq | INTRAVENOUS | Status: DC
  Filled 2019-08-23: qty 40

## 2019-08-23 MED ORDER — CHLORHEXIDINE GLUCONATE 0.12 % MT SOLN
15.0000 mL | Freq: Once | OROMUCOSAL | Status: AC
  Administered 2019-08-24: 15 mL via OROMUCOSAL
  Filled 2019-08-23: qty 15

## 2019-08-23 MED ORDER — MILRINONE LACTATE IN DEXTROSE 20-5 MG/100ML-% IV SOLN
0.3000 ug/kg/min | INTRAVENOUS | Status: DC
  Filled 2019-08-23: qty 100

## 2019-08-23 MED ORDER — DEXMEDETOMIDINE HCL IN NACL 400 MCG/100ML IV SOLN
0.1000 ug/kg/h | INTRAVENOUS | Status: DC
  Filled 2019-08-23: qty 100

## 2019-08-23 MED ORDER — CHLORHEXIDINE GLUCONATE CLOTH 2 % EX PADS
6.0000 | MEDICATED_PAD | Freq: Once | CUTANEOUS | Status: AC
  Administered 2019-08-23: 6 via TOPICAL

## 2019-08-23 MED ORDER — TRANEXAMIC ACID (OHS) PUMP PRIME SOLUTION
2.0000 mg/kg | INTRAVENOUS | Status: DC
  Filled 2019-08-23: qty 1.26

## 2019-08-23 MED ORDER — TEMAZEPAM 15 MG PO CAPS: 15.0000 mg | ORAL_CAPSULE | Freq: Once | ORAL | Status: DC | PRN

## 2019-08-23 MED ORDER — NITROGLYCERIN IN D5W 200-5 MCG/ML-% IV SOLN
2.0000 ug/min | INTRAVENOUS | Status: AC
  Administered 2019-08-24: 5 ug/min via INTRAVENOUS
  Filled 2019-08-23: qty 250

## 2019-08-23 MED FILL — Heparin Sodium (Porcine) Inj 1000 Unit/ML: INTRAMUSCULAR | Qty: 10 | Status: AC

## 2019-08-23 MED FILL — Nitroglycerin IV Soln 100 MCG/ML in D5W: INTRA_ARTERIAL | Qty: 10 | Status: AC

## 2019-08-23 MED FILL — Verapamil HCl IV Soln 2.5 MG/ML: INTRAVENOUS | Qty: 2 | Status: AC

## 2019-08-23 NOTE — Progress Notes (Signed)
ANTICOAGULATION CONSULT NOTE  Pharmacy Consult for Heparin Indication: MV CAD  No Known Allergies  Patient Measurements: Height: 5\' 4"  (162.6 cm) Weight: 63 kg (138 lb 12.8 oz) IBW/kg (Calculated) : 59.2 Heparin Dosing Weight:  62.6  kg  Vital Signs: Temp: 98.5 F (36.9 C) (07/19 0748) Temp Source: Oral (07/19 0748) BP: 134/62 (07/19 0748) Pulse Rate: 68 (07/19 0748)  Labs: Recent Labs    08/20/19 1910 08/20/19 2016 08/21/19 0757 08/21/19 0757 08/22/19 0529 08/22/19 0715 08/23/19 0622  HGB  --   --  11.0*   < > 10.9*  --  11.3*  HCT  --   --  35.4*  --  34.3*  --  36.3*  PLT  --   --  270  --  241  --  240  APTT  --   --  70*  --   --   --   --   LABPROT  --   --  15.5*  --   --   --   --   INR  --   --  1.3*  --   --   --   --   HEPARINUNFRC  --   --  0.25*  --   --  0.57 0.47  CREATININE  --   --  1.21  --   --   --  1.19  TROPONINIHS 4,189* 4,969*  --   --   --   --   --    < > = values in this interval not displayed.    Estimated Creatinine Clearance: 53.2 mL/min (by C-G formula based on SCr of 1.19 mg/dL).   Medical History: Past Medical History:  Diagnosis Date  . Anxiety   . Arthritis   . Depression   . Diabetes mellitus without complication (HCC)   . History of kidney stones   . Hypertension     Assessment:  64 yo male with CAD and recent anteroseptal infarct. Cardiac cath revealed severe 3 vessel CAD. Patient is with reduced EF of 25-30%. Per cardiology, the patient is to undergo a CABG on 7/20.  Pharmacy was consulted to dose IV heparin.   Heparin level this morning is therapeutic at 0.47 on 1000 units/hr. Hgb stable at 11.3, plt 240. No s/sx of bleeding or infusion issues per nursing. Plan for CABG 7/20.  Goal of Therapy:  Heparin level 0.3-0.7 units/ml Monitor platelets by anticoagulation protocol: Yes   Plan:  Continue heparin IV at 1000 units/hr Daily heparin level and CBC Monitor for signs and symptoms of bleeding  8/20,  PharmD, BCCCP Clinical Pharmacist  Phone: [phone] 08/23/2019 8:54 AM  Please check AMION for all [facility] phone numbers After 10:00 PM, call Main Pharmacy [phone]

## 2019-08-23 NOTE — Progress Notes (Signed)
Pharmacist Heart Failure Core Measure Documentation  Assessment: Jonathan Holder has an EF documented as 25-30% on 08/21/2019 by ECHO.  Rationale: Heart failure patients with left ventricular systolic dysfunction (LVSD) and an EF < 40% should be prescribed an angiotensin converting enzyme inhibitor (ACEI) or angiotensin receptor blocker (ARB) at discharge unless a contraindication is documented in the medical record.  This patient is not currently on an ACEI or ARB for HF.  This note is being placed in the record in order to provide documentation that a contraindication to the use of these agents is present for this encounter.  ACE Inhibitor or Angiotensin Receptor Blocker is contraindicated (specify all that apply)  []   ACEI allergy AND ARB allergy []   Angioedema []   Moderate or severe aortic stenosis []   Hyperkalemia [x]   Hypotension []   Renal artery stenosis []   Worsening renal function, preexisting renal disease or dysfunction  Patient is planning to undergo CABG on 7/20 - plan to follow up post-op and consider adding when appropriate.   , PharmD, BCCCP Clinical Pharmacist  Phone: [phone] 08/23/2019 3:22 PM  Please check AMION for all [facility] Pharmacy phone numbers After 10:00 PM, call Main Pharmacy [phone]

## 2019-08-23 NOTE — Anesthesia Preprocedure Evaluation (Addendum)
Anesthesia Evaluation  Patient identified by MRN, date of birth, ID band Patient awake    Reviewed: Allergy & Precautions, H&P , NPO status , Patient's Chart, lab work & pertinent test results  Airway Mallampati: II  TM Distance: >3 FB Neck ROM: Full    Dental no notable dental hx. (+) Teeth Intact, Dental Advisory Given   Pulmonary neg pulmonary ROS,    Pulmonary exam normal breath sounds clear to auscultation       Cardiovascular Exercise Tolerance: Good hypertension, Pt. on medications + CAD and + Past MI   Rhythm:Regular Rate:Normal     Neuro/Psych Anxiety Depression negative neurological ROS     GI/Hepatic negative GI ROS, Neg liver ROS,   Endo/Other  diabetes, Type 1, Insulin Dependent  Renal/GU negative Renal ROS  negative genitourinary   Musculoskeletal  (+) Arthritis , Osteoarthritis,    Abdominal   Peds  Hematology negative hematology ROS (+)   Anesthesia Other Findings   Reproductive/Obstetrics negative OB ROS                            Anesthesia Physical Anesthesia Plan  ASA: IV  Anesthesia Plan: General   Post-op Pain Management:    Induction: Intravenous  PONV Risk Score and Plan: 2 and Treatment may vary due to age or medical condition and Midazolam  Airway Management Planned: Oral ETT  Additional Equipment: Arterial line, CVP, PA Cath, TEE and Ultrasound Guidance Line Placement  Intra-op Plan:   Post-operative Plan: Post-operative intubation/ventilation  Informed Consent: I have reviewed the patients History and Physical, chart, labs and discussed the procedure including the risks, benefits and alternatives for the proposed anesthesia with the patient or authorized representative who has indicated his/her understanding and acceptance.       Plan Discussed with: CRNA and Surgeon  Anesthesia Plan Comments:        Anesthesia Quick Evaluation

## 2019-08-23 NOTE — Progress Notes (Addendum)
Progress Note  Patient Name: Jonathan Holder Date of Encounter: 08/23/2019  [facility] Cardiologist: Gypsy Balsam, MD   Subjective   No chest pain and no SOB.  No complaints  Inpatient Medications    Scheduled Meds: . aspirin EC  81 mg Oral BH-q7a  . atorvastatin  80 mg Oral Daily  . desvenlafaxine  100 mg Oral BH-q7a  . gabapentin  900 mg Oral BH-q7a  . insulin aspart  0-15 Units Subcutaneous TID WC  . insulin glargine  20 Units Subcutaneous QHS  . metoprolol tartrate  25 mg Oral BID  . mupirocin ointment  1 application Nasal BID  . sodium chloride flush  3 mL Intravenous Q12H   Continuous Infusions: . sodium chloride    . heparin 1,000 Units/hr (08/23/19 0102)  . nitroGLYCERIN 10 mcg/min (08/20/19 1656)   PRN Meds: sodium chloride, acetaminophen, HYDROcodone-acetaminophen, ondansetron (ZOFRAN) IV, sodium chloride flush   Vital Signs    Vitals:   08/23/19 0100 08/23/19 0502 08/23/19 0613 08/23/19 0748  BP: (!) 102/58 (!) 104/51  134/62  Pulse:    68  Resp: Temp:  98.9 F (37.2 C)  98.5 F (36.9 C)  TempSrc:  Oral  Oral  SpO2:  97%  98%  Weight:  63.6 kg 63 kg   Height:        Intake/Output Summary (Last 24 hours) at 08/23/2019 0751 Last data filed at 08/23/2019 0600 Gross per 24 hour  Intake 506 ml  Output 800 ml  Net -294 ml   Last 3 Weights 08/23/2019 08/23/2019 08/22/2019  Weight (lbs) 138 lb 12.8 oz 140 lb 3.4 oz 134 lb 7.7 oz  Weight (kg) 62.959 kg 63.6 kg 61 kg      Telemetry    SR to SB  - Personally Reviewed  ECG    No new - Personally Reviewed  Physical Exam   GEN: No acute distress.   Neck: No JVD Cardiac: RRR, no murmurs, rubs, or gallops.  Respiratory: Clear to auscultation bilaterally. GI: Soft, nontender, non-distended  MS: No edema; No deformity. Neuro:  Nonfocal  Psych: Normal affect   Labs    High Sensitivity Troponin:   Recent Labs  Lab 08/20/19 1910 08/20/19 2016  TROPONINIHS 4,189* 4,969*       Chemistry Recent Labs  Lab 08/21/19 0757 08/23/19 0622  NA 143 139  K 4.3 4.1  CL 106 104  CO2 28 26  GLUCOSE 127* 110*  BUN 16 17  CREATININE 1.21 1.19  CALCIUM 9.3 9.2  PROT 6.3*  --   ALBUMIN 2.8*  --   AST 13*  --   ALT 9  --   ALKPHOS 73  --   BILITOT 0.5  --   GFRNONAA >60 >60  GFRAA >60 >60  ANIONGAP 9 9     Hematology Recent Labs  Lab 08/21/19 0757 08/22/19 0529 08/23/19 0622  WBC 5.9 6.0 5.5  RBC 3.65* 3.58* 3.83*  HGB 11.0* 10.9* 11.3*  HCT 35.4* 34.3* 36.3*  MCV 97.0 95.8 94.8  MCH 30.1 30.4 29.5  MCHC 31.1 31.8 31.1  RDW 13.5 13.5 13.3  PLT 270 241 240    BNP Recent Labs  Lab 08/21/19 0757  BNP 513.2*     DDimer No results for input(s): DDIMER in the last 168 hours.   Radiology    DG Chest 2 View  Result Date: 08/21/2019 CLINICAL DATA:  64 year old male with a history of congestive heart failure  EXAM: CHEST - 2 VIEW COMPARISON:  08/20/2019 FINDINGS: Cardiomediastinal silhouette unchanged in size and contour. No pneumothorax. No pleural effusion. No confluent airspace disease. No acute displaced fracture. Chronic rib deformity on the right. Degenerative changes of the spine. IMPRESSION: Negative for acute cardiopulmonary disease. Electronically Signed   By: Gilmer Mor D.O.   On: 08/21/2019 15:25   ECHOCARDIOGRAM COMPLETE  Result Date: 08/21/2019    ECHOCARDIOGRAM REPORT   Patient Name:   Jonathan Holder Date of Exam: 08/21/2019 Medical Rec #:  [id number]    Height:       64.0 in Accession #:    [id number]   Weight:       138.0 lb Date of Birth:  1955-10-09     BSA:          1.671 m Patient Age:    64 years     BP:           107/44 mmHg Patient Gender: M            HR:           58 bpm. Exam Location:  Inpatient Procedure: 2D Echo and Intracardiac Opacification Agent Indications:    Acute myocardial infarction 410  History:        Patient has no prior history of Echocardiogram examinations.                 Risk Factors:Hypertension and Diabetes.   Sonographer:    Leta Jungling RDCS Referring Phys: [id number] CLARENCE H OWEN IMPRESSIONS  1. LVEF is depressed with severe hypokinesis/akinesis of the distal 2/3 of LV.Marland Kitchen Left ventricular ejection fraction, by estimation, is 25 to 30%. The left ventricle has severely decreased function. The left ventricle demonstrates regional wall motion abnormalities (see scoring diagram/findings for description). Left ventricular diastolic parameters are consistent with Grade II diastolic dysfunction (pseudonormalization).  2. Right ventricular systolic function is normal. The right ventricular size is normal.  3. The mitral valve is normal in structure. Trivial mitral valve regurgitation.  4. The aortic valve is normal in structure. Aortic valve regurgitation is not visualized.   Dietrich Pates MD Electronically signed by Dietrich Pates MD Signature Date/Time: 08/21/2019/12:19:35 PM    Final    VAS US DOPPLER PRE CABG  Result Date: 08/22/2019 PREOPERATIVE VASCULAR EVALUATION  Indications:      Pre-CABG. Risk Factors:     Hypertension, hyperlipidemia, Diabetes. Comparison Study: No prior studies. Performing Technologist: Jean Rosenthal Supporting Technologist: Sherren Kerns RVS  Examination Guidelines: A complete evaluation includes B-mode imaging, spectral Doppler, color Doppler, and power Doppler as needed of all accessible portions of each vessel. Bilateral testing is considered an integral part of a complete examination. Limited examinations for reoccurring indications may be performed as noted.  Right Carotid Findings: +----------+--------+--------+--------+---------------------+------------------+           PSV cm/sEDV cm/sStenosisDescribe             Comments           +----------+--------+--------+--------+---------------------+------------------+ CCA Prox  59      12                                   intimal thickening +----------+--------+--------+--------+---------------------+------------------+ CCA Distal49       13              focal and  heterogenous                            +----------+--------+--------+--------+---------------------+------------------+ ICA Prox  77      24      1-39%                                           +----------+--------+--------+--------+---------------------+------------------+ ICA Distal49      16                                                      +----------+--------+--------+--------+---------------------+------------------+ ECA       82      16                                                      +----------+--------+--------+--------+---------------------+------------------+ Portions of this table do not appear on this page. +----------+--------+-------+----------------+------------+           PSV cm/sEDV cmsDescribe        Arm Pressure +----------+--------+-------+----------------+------------+ Subclavian100            Multiphasic, WNL             +----------+--------+-------+----------------+------------+ +---------+--------+--+--------+--+--------+ VertebralPSV cm/s47EDV cm/s30Atypical +---------+--------+--+--------+--+--------+ Left Carotid Findings: +----------+--------+--------+--------+--------+------------------+           PSV cm/sEDV cm/sStenosisDescribeComments           +----------+--------+--------+--------+--------+------------------+ CCA Prox  63      16                      intimal thickening +----------+--------+--------+--------+--------+------------------+ CCA Distal75      16                      intimal thickening +----------+--------+--------+--------+--------+------------------+ ICA Prox  69      25      1-39%                              +----------+--------+--------+--------+--------+------------------+ ICA Distal50      18                                          +----------+--------+--------+--------+--------+------------------+ ECA       97      13                                         +----------+--------+--------+--------+--------+------------------+ +----------+--------+--------+----------------+------------+ SubclavianPSV cm/sEDV cm/sDescribe        Arm Pressure +----------+--------+--------+----------------+------------+           113             Multiphasic, WNL             +----------+--------+--------+----------------+------------+ +---------+--------+--+--------+--+---------+ VertebralPSV cm/s49EDV cm/s10Antegrade +---------+--------+--+--------+--+---------+  ABI Findings: +---------+------------------+-----+---------+--------+ Right    Rt Pressure (mmHg)IndexWaveform Comment  +---------+------------------+-----+---------+--------+  Brachial 177                    triphasic         +---------+------------------+-----+---------+--------+ PTA                             triphasicNC       +---------+------------------+-----+---------+--------+ DP                              triphasicNC       +---------+------------------+-----+---------+--------+ Great Toe121               0.68 Abnormal          +---------+------------------+-----+---------+--------+ +---------+------------------+-----+---------+-------+ Left     Lt Pressure (mmHg)IndexWaveform Comment +---------+------------------+-----+---------+-------+ Brachial 139                    triphasic        +---------+------------------+-----+---------+-------+ PTA      226               1.28 triphasic        +---------+------------------+-----+---------+-------+ DP                              triphasicNC      +---------+------------------+-----+---------+-------+ Great Toe84                0.47 Abnormal         +---------+------------------+-----+---------+-------+ +-------+---------------+----------------+ ABI/TBIToday's  ABI/TBIPrevious ABI/TBI +-------+---------------+----------------+ Left   1.28                            +-------+---------------+----------------+ Arterial wall calcification precludes accurate ankle pressures and ABIs.  Right Doppler Findings: +--------+--------+-----+----------+---------------------------------+ Site    PressureIndexDoppler   Comments                          +--------+--------+-----+----------+---------------------------------+ [id number]          triphasic                                   +--------+--------+-----+----------+---------------------------------+ Radial               monophasicPatient had recent RT radial cath +--------+--------+-----+----------+---------------------------------+ Ulnar                triphasic                                   +--------+--------+-----+----------+---------------------------------+  Left Doppler Findings: +--------+--------+-----+---------+--------+ Site    PressureIndexDoppler  Comments +--------+--------+-----+---------+--------+ [id number]          triphasic         +--------+--------+-----+---------+--------+ Radial               triphasic         +--------+--------+-----+---------+--------+ Ulnar                triphasic         +--------+--------+-----+---------+--------+  Summary: Right Carotid: Velocities in the right ICA are consistent with a 1-39% stenosis.                Non-hemodynamically significant plaque <50% noted in the CCA. Left  Carotid: Velocities in the left ICA are consistent with a 1-39% stenosis. Vertebrals:  Left vertebral artery demonstrates antegrade flow. Abnormal,              blunted waveform in RT vertebral. >16mmHg difference noted in              brachial pressures. Subclavians: Normal flow hemodynamics were seen in bilateral subclavian              arteries. Right ABI: Resting right ankle-brachial index indicates noncompressible right lower extremity arteries.  The right toe-brachial index is abnormal. Left ABI: Resting left ankle-brachial index indicates noncompressible left lower extremity arteries. The left toe-brachial index is abnormal. Right Upper Extremity: Doppler waveforms decrease <50% with right radial compression. Doppler waveform obliterate with right ulnar compression. Left Upper Extremity: Doppler waveforms remain within normal limits with left radial compression. Doppler waveforms remain within normal limits with left ulnar compression.  Electronically signed by Fabienne Bruns MD on 08/22/2019 at 12:59:46 PM.    Final     Cardiac Studies   CARDIAC CATHETERIZATION  08/20/2019  Mid LAD lesion is 95% stenosed.  Prox Cx lesion is 85% stenosed.  Ramus-1 lesion is 70% stenosed.  Ramus-2 lesion is 80% stenosed.  Prox Cx to Mid Cx lesion is 70% stenosed.  Ramus-3 lesion is 90% stenosed.  Prox RCA-1 lesion is 30% stenosed.  Prox RCA-2 lesion is 30% stenosed.  Mid RCA lesion is 70% stenosed.  RPDA lesion is 70% stenosed.  The left ventricular ejection fraction is 25-35% by visual estimate.  LV end diastolic pressure is mildly elevated.  There is moderate to severe left ventricular systolic dysfunction.  There is no aortic valve stenosis.    ECHOCARDIOGRAM COMPLETE  08/21/2019 IMPRESSIONS   1. LVEF is depressed with severe hypokinesis/akinesis of the distal 2/3 of LV.Marland Kitchen Left ventricular ejection fraction, by estimation, is 25 to 30%. The left ventricle has severely decreased function. The left ventricle demonstrates regional wall motion abnormalities (see scoring diagram/findings for description). Left ventricular diastolic parameters are consistent with Grade II diastolic dysfunction (pseudonormalization).  2. Right ventricular systolic function is normal. The right ventricular size is normal.   3. The mitral valve is normal in structure. Trivial mitral valve regurgitation.   4. The aortic valve is normal in structure. Aortic valve  regurgitation is not visualized.    Pre CABG dopplers  Summary:  Right Carotid: Velocities in the right ICA are consistent with a 1-39%  stenosis.         Non-hemodynamically significant plaque <50% noted in the  CCA.   Left Carotid: Velocities in the left ICA are consistent with a 1-39%  stenosis.  Vertebrals: Left vertebral artery demonstrates antegrade flow. Abnormal,        blunted waveform in RT vertebral. >29mmHg difference noted in        brachial pressures.  Subclavians: Normal flow hemodynamics were seen in bilateral subclavian        arteries.   Right ABI: Resting right ankle-brachial index indicates noncompressible  right lower extremity arteries. The right toe-brachial index is abnormal.  Left ABI: Resting left ankle-brachial index indicates noncompressible left  lower extremity arteries. The left toe-brachial index is abnormal.  Right Upper Extremity: Doppler waveforms decrease <50% with right radial  compression. Doppler waveform obliterate with right ulnar compression.  Left Upper Extremity: Doppler waveforms remain within normal limits with  left radial compression. Doppler waveforms remain within normal limits  with left ulnar compression.  Patient Profile     64 y.o. male transfer from [facility] with DM1, HTN, hx of PE and dyslipidemia who presented to his PCP for a regular checkup at which time an EKG was performed that showed ST segment elevation in multiple leads.  He was advised to go to the ED for further evaluation. Troponin found to be 10.7.  He reported an episode of chest pain in the middle of the night 2 weeks prior with associated diaphoresis with pain radiation to the left arm.  Cardiac catheterization this admission demonstrated 3 v CAD and EF 25-35%.  Plan is for CABG Tues 08/24/2019.  Assessment & Plan    1. Coronary artery disease  S/p recent OOH anteroseptal infarct.  3 vessel disease on cath.  Plan for  CABG Tues.  He is stable without ongoing angina.  Continue ASA, statin, beta-blocker, IV NTG, IV Heparin.  2. 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  Volume status is stable.  (negative 634) Continue beta-blocker.  With pending surgery in the next few days and soft BP, will hold off on starting ACE inhibitor/angiotensin receptor blocker, spironolactone.  Will hopefully be able to titrate CHF meds post op.    3. Hyperlipidemia  Continue high intensity statin.   --LDL 48 HDL 30  4. Diabetes mellitus  Continue basal insulin and SSI.  --glucose 91 to 214   5.  Mild Carotid disease on statin.   For questions or updates, please contact [facility] Please consult [email or web address] for contact info under        Signed, Nada Boozer, NP  08/23/2019, 7:51 AM    ATTENDING ATTESTATION  I have seen, examined and evaluated the patient this PM after he was seen by Bryna Colander, NP.  After reviewing all the available data and chart, we discussed the patients laboratory, study & physical findings as well as symptoms in detail. I agree with her findings, examination as well as impression recommendations as per our discussion.    Recovered well from his events, no further chest pain.  Awaiting surgery tomorrow. Maintaining on IV heparin and nitroglycerin.  He is on aspirin high-dose high intensity statin.  With relatively good lipid panel for now.  We will need to reassess in the outpatient setting.  He has severely reduced EF with diastolic dysfunction, but no active heart failure symptoms.  Perioperative time would continue beta-blocker (would need to convert to either carvedilol or Toprol on discharge) -> 1 stable postoperative, would also consider adding ARB (for Entresto if BP will allow)  spironolactone.  He is stable today.  No major changes needed. We will monitor following CABG.   Bryan Lemma, M.D.,  M.S. Interventional Cardiologist   Pager # [phone] Phone # [phone] [address] [place], [place] [address]

## 2019-08-23 NOTE — Progress Notes (Signed)
[id number] Talked with pt to see if any questions re tentative surgery. Pt using IS frequently. Emotional support given. Reinforced how important it will be to walk after surgery. Pt going to try to watch pre op video today. Will follow up after surgery. Luetta Nutting RN BSN 08/23/2019 1:45 PM

## 2019-08-23 NOTE — Progress Notes (Signed)
Chaplain responded to Spiritual Consult for delivery of AD. Chaplain initiated a relationship of care and concern for patient's welfare.  Patient expressed being scared about the surgery tomorrow but at peace with whatever happens.  He is prepared for whatever the outcome is.  When queried about how he got prepared patient said he is saved so is ok, though his hope is the surgery goes well and he gets to go home.  Chaplain affirmed patient's confidence in his salvation.  Chaplain then explained the AD. Chaplain stands ready to notarize the AD when it has been completed.  Vernell Morgans Chaplain Resident

## 2019-08-24 ENCOUNTER — Inpatient Hospital Stay (HOSPITAL_COMMUNITY)
Admission: AD | Disposition: A | Discharge status: Home / Self Care | Payer: Self-pay | Source: Other Acute Inpatient Hospital | Attending: Cardiothoracic Surgery

## 2019-08-24 ENCOUNTER — Inpatient Hospital Stay (HOSPITAL_COMMUNITY): Payer: BC Managed Care – PPO | Admitting: Certified Registered Nurse Anesthetist

## 2019-08-24 ENCOUNTER — Inpatient Hospital Stay (HOSPITAL_COMMUNITY): Payer: BC Managed Care – PPO

## 2019-08-24 DIAGNOSIS — Z951 Presence of aortocoronary bypass graft: Secondary | ICD-10-CM

## 2019-08-24 DIAGNOSIS — I2510 Atherosclerotic heart disease of native coronary artery without angina pectoris: Secondary | ICD-10-CM

## 2019-08-24 HISTORY — PX: TEE WITHOUT CARDIOVERSION: SHX5443

## 2019-08-24 HISTORY — PX: CORONARY ARTERY BYPASS GRAFT: SHX141

## 2019-08-24 HISTORY — DX: Presence of aortocoronary bypass graft: Z95.1

## 2019-08-24 HISTORY — PX: RADIAL ARTERY HARVEST: SHX5067

## 2019-08-24 LAB — POCT I-STAT 7, (LYTES, BLD GAS, ICA,H+H)
Acid-Base Excess: 5 mmol/L — ABNORMAL HIGH (ref 0.0–2.0)
Acid-Base Excess: 6 mmol/L — ABNORMAL HIGH (ref 0.0–2.0)
Acid-Base Excess: 3 mmol/L — ABNORMAL HIGH (ref 0.0–2.0)
Acid-base deficit: 1 mmol/L (ref 0.0–2.0)
Acid-base deficit: 5 mmol/L — ABNORMAL HIGH (ref 0.0–2.0)
Acid-base deficit: 3 mmol/L — ABNORMAL HIGH (ref 0.0–2.0)
Bicarbonate: 28.7 mmol/L — ABNORMAL HIGH (ref 20.0–28.0)
Bicarbonate: 30.2 mmol/L — ABNORMAL HIGH (ref 20.0–28.0)
Bicarbonate: 27.7 mmol/L (ref 20.0–28.0)
Bicarbonate: 22.6 mmol/L (ref 20.0–28.0)
Bicarbonate: 21.4 mmol/L (ref 20.0–28.0)
Bicarbonate: 22.5 mmol/L (ref 20.0–28.0)
Calcium, Ion: 1.03 mmol/L — ABNORMAL LOW (ref 1.15–1.40)
Calcium, Ion: 1.09 mmol/L — ABNORMAL LOW (ref 1.15–1.40)
Calcium, Ion: 1.13 mmol/L — ABNORMAL LOW (ref 1.15–1.40)
Calcium, Ion: 1.13 mmol/L — ABNORMAL LOW (ref 1.15–1.40)
Calcium, Ion: 1.13 mmol/L — ABNORMAL LOW (ref 1.15–1.40)
Calcium, Ion: 1.13 mmol/L — ABNORMAL LOW (ref 1.15–1.40)
HCT: 25 % — ABNORMAL LOW (ref 39.0–52.0)
HCT: 26 % — ABNORMAL LOW (ref 39.0–52.0)
HCT: 26 % — ABNORMAL LOW (ref 39.0–52.0)
HCT: 32 % — ABNORMAL LOW (ref 39.0–52.0)
HCT: 30 % — ABNORMAL LOW (ref 39.0–52.0)
HCT: 27 % — ABNORMAL LOW (ref 39.0–52.0)
Hemoglobin: 8.5 g/dL — ABNORMAL LOW (ref 13.0–17.0)
Hemoglobin: 8.8 g/dL — ABNORMAL LOW (ref 13.0–17.0)
Hemoglobin: 8.8 g/dL — ABNORMAL LOW (ref 13.0–17.0)
Hemoglobin: 10.9 g/dL — ABNORMAL LOW (ref 13.0–17.0)
Hemoglobin: 10.2 g/dL — ABNORMAL LOW (ref 13.0–17.0)
Hemoglobin: 9.2 g/dL — ABNORMAL LOW (ref 13.0–17.0)
O2 Saturation: 100 %
O2 Saturation: 100 %
O2 Saturation: 100 %
O2 Saturation: 98 %
O2 Saturation: 97 %
O2 Saturation: 99 %
Patient temperature: 35.5
Patient temperature: 37.4
Patient temperature: 38.2
Potassium: 3.7 mmol/L (ref 3.5–5.1)
Potassium: 4.1 mmol/L (ref 3.5–5.1)
Potassium: 5.1 mmol/L (ref 3.5–5.1)
Potassium: 3.5 mmol/L (ref 3.5–5.1)
Potassium: 4.6 mmol/L (ref 3.5–5.1)
Potassium: 5.1 mmol/L (ref 3.5–5.1)
Sodium: 142 mmol/L (ref 135–145)
Sodium: 139 mmol/L (ref 135–145)
Sodium: 140 mmol/L (ref 135–145)
Sodium: 145 mmol/L (ref 135–145)
Sodium: 145 mmol/L (ref 135–145)
Sodium: 142 mmol/L (ref 135–145)
TCO2: 30 mmol/L (ref 22–32)
TCO2: 31 mmol/L (ref 22–32)
TCO2: 29 mmol/L (ref 22–32)
TCO2: 24 mmol/L (ref 22–32)
TCO2: 23 mmol/L (ref 22–32)
TCO2: 24 mmol/L (ref 22–32)
pCO2 arterial: 36.5 mmHg (ref 32.0–48.0)
pCO2 arterial: 39 mmHg (ref 32.0–48.0)
pCO2 arterial: 42.3 mmHg (ref 32.0–48.0)
pCO2 arterial: 31 mmHg — ABNORMAL LOW (ref 32.0–48.0)
pCO2 arterial: 42.8 mmHg (ref 32.0–48.0)
pCO2 arterial: 42.4 mmHg (ref 32.0–48.0)
pH, Arterial: 7.504 — ABNORMAL HIGH (ref 7.350–7.450)
pH, Arterial: 7.498 — ABNORMAL HIGH (ref 7.350–7.450)
pH, Arterial: 7.425 (ref 7.350–7.450)
pH, Arterial: 7.464 — ABNORMAL HIGH (ref 7.350–7.450)
pH, Arterial: 7.309 — ABNORMAL LOW (ref 7.350–7.450)
pH, Arterial: 7.338 — ABNORMAL LOW (ref 7.350–7.450)
pO2, Arterial: 309 mmHg — ABNORMAL HIGH (ref 83.0–108.0)
pO2, Arterial: 372 mmHg — ABNORMAL HIGH (ref 83.0–108.0)
pO2, Arterial: 235 mmHg — ABNORMAL HIGH (ref 83.0–108.0)
pO2, Arterial: 88 mmHg (ref 83.0–108.0)
pO2, Arterial: 100 mmHg (ref 83.0–108.0)
pO2, Arterial: 136 mmHg — ABNORMAL HIGH (ref 83.0–108.0)

## 2019-08-24 LAB — POCT I-STAT, CHEM 8
BUN: 17 mg/dL (ref 8–23)
BUN: 16 mg/dL (ref 8–23)
BUN: 17 mg/dL (ref 8–23)
BUN: 15 mg/dL (ref 8–23)
BUN: 15 mg/dL (ref 8–23)
BUN: 14 mg/dL (ref 8–23)
Calcium, Ion: 1.3 mmol/L (ref 1.15–1.40)
Calcium, Ion: 1.24 mmol/L (ref 1.15–1.40)
Calcium, Ion: 1.27 mmol/L (ref 1.15–1.40)
Calcium, Ion: 1.09 mmol/L — ABNORMAL LOW (ref 1.15–1.40)
Calcium, Ion: 1.09 mmol/L — ABNORMAL LOW (ref 1.15–1.40)
Calcium, Ion: 1.07 mmol/L — ABNORMAL LOW (ref 1.15–1.40)
Chloride: 102 mmol/L (ref 98–111)
Chloride: 102 mmol/L (ref 98–111)
Chloride: 102 mmol/L (ref 98–111)
Chloride: 99 mmol/L (ref 98–111)
Chloride: 102 mmol/L (ref 98–111)
Chloride: 102 mmol/L (ref 98–111)
Creatinine, Ser: 1.1 mg/dL (ref 0.61–1.24)
Creatinine, Ser: 1 mg/dL (ref 0.61–1.24)
Creatinine, Ser: 1 mg/dL (ref 0.61–1.24)
Creatinine, Ser: 0.8 mg/dL (ref 0.61–1.24)
Creatinine, Ser: 0.8 mg/dL (ref 0.61–1.24)
Creatinine, Ser: 0.8 mg/dL (ref 0.61–1.24)
Glucose, Bld: 80 mg/dL (ref 70–99)
Glucose, Bld: 78 mg/dL (ref 70–99)
Glucose, Bld: 75 mg/dL (ref 70–99)
Glucose, Bld: 102 mg/dL — ABNORMAL HIGH (ref 70–99)
Glucose, Bld: 107 mg/dL — ABNORMAL HIGH (ref 70–99)
Glucose, Bld: 109 mg/dL — ABNORMAL HIGH (ref 70–99)
HCT: 30 % — ABNORMAL LOW (ref 39.0–52.0)
HCT: 28 % — ABNORMAL LOW (ref 39.0–52.0)
HCT: 30 % — ABNORMAL LOW (ref 39.0–52.0)
HCT: 27 % — ABNORMAL LOW (ref 39.0–52.0)
HCT: 27 % — ABNORMAL LOW (ref 39.0–52.0)
HCT: 26 % — ABNORMAL LOW (ref 39.0–52.0)
Hemoglobin: 10.2 g/dL — ABNORMAL LOW (ref 13.0–17.0)
Hemoglobin: 9.5 g/dL — ABNORMAL LOW (ref 13.0–17.0)
Hemoglobin: 10.2 g/dL — ABNORMAL LOW (ref 13.0–17.0)
Hemoglobin: 9.2 g/dL — ABNORMAL LOW (ref 13.0–17.0)
Hemoglobin: 9.2 g/dL — ABNORMAL LOW (ref 13.0–17.0)
Hemoglobin: 8.8 g/dL — ABNORMAL LOW (ref 13.0–17.0)
Potassium: 4 mmol/L (ref 3.5–5.1)
Potassium: 3.7 mmol/L (ref 3.5–5.1)
Potassium: 3.9 mmol/L (ref 3.5–5.1)
Potassium: 4.4 mmol/L (ref 3.5–5.1)
Potassium: 4.9 mmol/L (ref 3.5–5.1)
Potassium: 3.8 mmol/L (ref 3.5–5.1)
Sodium: 141 mmol/L (ref 135–145)
Sodium: 140 mmol/L (ref 135–145)
Sodium: 141 mmol/L (ref 135–145)
Sodium: 136 mmol/L (ref 135–145)
Sodium: 139 mmol/L (ref 135–145)
Sodium: 143 mmol/L (ref 135–145)
TCO2: 25 mmol/L (ref 22–32)
TCO2: 24 mmol/L (ref 22–32)
TCO2: 25 mmol/L (ref 22–32)
TCO2: 27 mmol/L (ref 22–32)
TCO2: 25 mmol/L (ref 22–32)
TCO2: 24 mmol/L (ref 22–32)

## 2019-08-24 LAB — CBC
HCT: 34.3 % — ABNORMAL LOW (ref 39.0–52.0)
HCT: 32.1 % — ABNORMAL LOW (ref 39.0–52.0)
HCT: 32 % — ABNORMAL LOW (ref 39.0–52.0)
Hemoglobin: 10.9 g/dL — ABNORMAL LOW (ref 13.0–17.0)
Hemoglobin: 10.5 g/dL — ABNORMAL LOW (ref 13.0–17.0)
Hemoglobin: 10.4 g/dL — ABNORMAL LOW (ref 13.0–17.0)
MCH: 30.3 pg (ref 26.0–34.0)
MCH: 30 pg (ref 26.0–34.0)
MCH: 30.1 pg (ref 26.0–34.0)
MCHC: 31.8 g/dL (ref 30.0–36.0)
MCHC: 32.7 g/dL (ref 30.0–36.0)
MCHC: 32.5 g/dL (ref 30.0–36.0)
MCV: 95.3 fL (ref 80.0–100.0)
MCV: 91.7 fL (ref 80.0–100.0)
MCV: 92.5 fL (ref 80.0–100.0)
Platelets: 206 10*3/uL (ref 150–400)
Platelets: 149 10*3/uL — ABNORMAL LOW (ref 150–400)
Platelets: 190 10*3/uL (ref 150–400)
RBC: 3.6 MIL/uL — ABNORMAL LOW (ref 4.22–5.81)
RBC: 3.5 MIL/uL — ABNORMAL LOW (ref 4.22–5.81)
RBC: 3.46 MIL/uL — ABNORMAL LOW (ref 4.22–5.81)
RDW: 13.2 % (ref 11.5–15.5)
RDW: 13.2 % (ref 11.5–15.5)
RDW: 13.5 % (ref 11.5–15.5)
WBC: 4.8 10*3/uL (ref 4.0–10.5)
WBC: 6.1 10*3/uL (ref 4.0–10.5)
WBC: 13.8 10*3/uL — ABNORMAL HIGH (ref 4.0–10.5)
nRBC: 0 % (ref 0.0–0.2)
nRBC: 0 % (ref 0.0–0.2)
nRBC: 0 % (ref 0.0–0.2)

## 2019-08-24 LAB — BASIC METABOLIC PANEL
Anion gap: 10 (ref 5–15)
Anion gap: 9 (ref 5–15)
BUN: 18 mg/dL (ref 8–23)
BUN: 14 mg/dL (ref 8–23)
CO2: 26 mmol/L (ref 22–32)
CO2: 22 mmol/L (ref 22–32)
Calcium: 9.1 mg/dL (ref 8.9–10.3)
Calcium: 8.2 mg/dL — ABNORMAL LOW (ref 8.9–10.3)
Chloride: 104 mmol/L (ref 98–111)
Chloride: 109 mmol/L (ref 98–111)
Creatinine, Ser: 1.22 mg/dL (ref 0.61–1.24)
Creatinine, Ser: 1.21 mg/dL (ref 0.61–1.24)
GFR calc Af Amer: >60 mL/min (ref >60)
GFR calc Af Amer: >60 mL/min (ref >60)
GFR calc non Af Amer: >60 mL/min (ref >60)
GFR calc non Af Amer: >60 mL/min (ref >60)
Glucose, Bld: 124 mg/dL — ABNORMAL HIGH (ref 70–99)
Glucose, Bld: 166 mg/dL — ABNORMAL HIGH (ref 70–99)
Potassium: 3.9 mmol/L (ref 3.5–5.1)
Potassium: 5.6 mmol/L — ABNORMAL HIGH (ref 3.5–5.1)
Sodium: 140 mmol/L (ref 135–145)
Sodium: 140 mmol/L (ref 135–145)

## 2019-08-24 LAB — ECHO INTRAOPERATIVE TEE
AR max vel: 2.02 cm2
AV Area VTI: 2.37 cm2
AV Area mean vel: 2.05 cm2
AV Mean grad: 4 mmHg
AV Peak grad: 7.6 mmHg
Ao pk vel: 1.38 m/s
Height: 64 in
S' Lateral: 3.53 cm
Single Plane A4C EF: 47.7 %
Weight: 2190.4 oz

## 2019-08-24 LAB — HEMOGLOBIN A1C
Hgb A1c MFr Bld: 9.7 % — ABNORMAL HIGH (ref 4.8–5.6)
Mean Plasma Glucose: 231.69 mg/dL

## 2019-08-24 LAB — GLUCOSE, CAPILLARY
Glucose-Capillary: 106 mg/dL — ABNORMAL HIGH (ref 70–99)
Glucose-Capillary: 100 mg/dL — ABNORMAL HIGH (ref 70–99)
Glucose-Capillary: 116 mg/dL — ABNORMAL HIGH (ref 70–99)
Glucose-Capillary: 40 mg/dL — CL (ref 70–99)
Glucose-Capillary: 94 mg/dL (ref 70–99)
Glucose-Capillary: 108 mg/dL — ABNORMAL HIGH (ref 70–99)
Glucose-Capillary: 159 mg/dL — ABNORMAL HIGH (ref 70–99)
Glucose-Capillary: 167 mg/dL — ABNORMAL HIGH (ref 70–99)
Glucose-Capillary: 130 mg/dL — ABNORMAL HIGH (ref 70–99)
Glucose-Capillary: 195 mg/dL — ABNORMAL HIGH (ref 70–99)
Glucose-Capillary: 192 mg/dL — ABNORMAL HIGH (ref 70–99)

## 2019-08-24 LAB — HEMOGLOBIN AND HEMATOCRIT, BLOOD
HCT: 25.3 % — ABNORMAL LOW (ref 39.0–52.0)
Hemoglobin: 8.3 g/dL — ABNORMAL LOW (ref 13.0–17.0)

## 2019-08-24 LAB — HEPARIN LEVEL (UNFRACTIONATED): Heparin Unfractionated: 0.25 IU/mL — ABNORMAL LOW (ref 0.30–0.70)

## 2019-08-24 LAB — PLATELET COUNT: Platelets: 188 10*3/uL (ref 150–400)

## 2019-08-24 LAB — APTT: aPTT: 27 seconds (ref 24–36)

## 2019-08-24 LAB — PROTIME-INR
INR: 1.6 — ABNORMAL HIGH (ref 0.8–1.2)
Prothrombin Time: 18.6 seconds — ABNORMAL HIGH (ref 11.4–15.2)

## 2019-08-24 LAB — MAGNESIUM: Magnesium: 2.8 mg/dL — ABNORMAL HIGH (ref 1.7–2.4)

## 2019-08-24 SURGERY — CORONARY ARTERY BYPASS GRAFTING (CABG)
Anesthesia: General | Site: Chest

## 2019-08-24 MED ORDER — HEPARIN SODIUM (PORCINE) 1000 UNIT/ML IJ SOLN
INTRAMUSCULAR | Status: AC
  Filled 2019-08-24: qty 1

## 2019-08-24 MED ORDER — ALBUMIN HUMAN 5 % IV SOLN: INTRAVENOUS | Status: DC | PRN

## 2019-08-24 MED ORDER — PHENYLEPHRINE HCL (PRESSORS) 10 MG/ML IV SOLN
INTRAVENOUS | Status: DC | PRN
Start: 2019-08-24 — End: 2019-08-24
  Administered 2019-08-24: 40 ug via INTRAVENOUS
  Administered 2019-08-24: 80 ug via INTRAVENOUS
  Administered 2019-08-24: 40 ug via INTRAVENOUS

## 2019-08-24 MED ORDER — METOPROLOL TARTRATE 12.5 MG HALF TABLET
12.5000 mg | ORAL_TABLET | Freq: Two times a day (BID) | ORAL | Status: DC
  Administered 2019-08-24 – 2019-08-25 (×2): 12.5 mg via ORAL
  Filled 2019-08-24 (×2): qty 1

## 2019-08-24 MED ORDER — MORPHINE SULFATE (PF) 2 MG/ML IV SOLN
1.0000 mg | INTRAVENOUS | Status: DC | PRN
  Administered 2019-08-24 – 2019-08-26 (×2): 2 mg via INTRAVENOUS
  Filled 2019-08-24: qty 1

## 2019-08-24 MED ORDER — HEMOSTATIC AGENTS (NO CHARGE) OPTIME
TOPICAL | Status: DC | PRN
  Administered 2019-08-24 (×2): 1 via TOPICAL

## 2019-08-24 MED ORDER — LACTATED RINGERS IV SOLN: INTRAVENOUS | Status: DC

## 2019-08-24 MED ORDER — DEXMEDETOMIDINE HCL IN NACL 400 MCG/100ML IV SOLN: 0.0000 ug/kg/h | INTRAVENOUS | Status: DC

## 2019-08-24 MED ORDER — GLYCOPYRROLATE PF 0.2 MG/ML IJ SOSY
PREFILLED_SYRINGE | INTRAMUSCULAR | Status: AC
  Filled 2019-08-24: qty 1

## 2019-08-24 MED ORDER — SODIUM CHLORIDE (PF) 0.9 % IJ SOLN
INTRAMUSCULAR | Status: AC
  Filled 2019-08-24: qty 10

## 2019-08-24 MED ORDER — SODIUM CHLORIDE 0.45 % IV SOLN: INTRAVENOUS | Status: DC | PRN

## 2019-08-24 MED ORDER — BUPIVACAINE HCL (PF) 0.5 % IJ SOLN
INTRAMUSCULAR | Status: AC
  Filled 2019-08-24: qty 30

## 2019-08-24 MED ORDER — BISACODYL 5 MG PO TBEC
10.0000 mg | DELAYED_RELEASE_TABLET | Freq: Every day | ORAL | Status: DC
  Administered 2019-08-25 – 2019-08-27 (×3): 10 mg via ORAL
  Filled 2019-08-24 (×4): qty 2

## 2019-08-24 MED ORDER — POTASSIUM CHLORIDE 10 MEQ/50ML IV SOLN
10.0000 meq | INTRAVENOUS | Status: AC
  Administered 2019-08-24 (×3): 10 meq via INTRAVENOUS

## 2019-08-24 MED ORDER — SODIUM CHLORIDE 0.9 % IV SOLN
1.5000 g | Freq: Two times a day (BID) | INTRAVENOUS | Status: AC
  Administered 2019-08-24 – 2019-08-26 (×4): 1.5 g via INTRAVENOUS
  Filled 2019-08-24 (×4): qty 1.5

## 2019-08-24 MED ORDER — CHLORHEXIDINE GLUCONATE CLOTH 2 % EX PADS
6.0000 | MEDICATED_PAD | Freq: Every day | CUTANEOUS | Status: DC
  Administered 2019-08-24 – 2019-08-28 (×3): 6 via TOPICAL

## 2019-08-24 MED ORDER — TRAMADOL HCL 50 MG PO TABS
50.0000 mg | ORAL_TABLET | ORAL | Status: DC | PRN
  Administered 2019-08-25: 100 mg via ORAL
  Administered 2019-08-30: 50 mg via ORAL
  Filled 2019-08-24: qty 1
  Filled 2019-08-24: qty 2

## 2019-08-24 MED ORDER — PROTAMINE SULFATE 10 MG/ML IV SOLN
INTRAVENOUS | Status: DC | PRN
  Administered 2019-08-24: 170 mg via INTRAVENOUS
  Administered 2019-08-24: 10 mg via INTRAVENOUS

## 2019-08-24 MED ORDER — ACETAMINOPHEN 160 MG/5ML PO SOLN: 1000.0000 mg | Freq: Four times a day (QID) | ORAL | Status: DC

## 2019-08-24 MED ORDER — LACTATED RINGERS IV SOLN: INTRAVENOUS | Status: DC | PRN

## 2019-08-24 MED ORDER — PANTOPRAZOLE SODIUM 40 MG PO TBEC
40.0000 mg | DELAYED_RELEASE_TABLET | Freq: Every day | ORAL | Status: DC
  Administered 2019-08-26 – 2019-08-30 (×5): 40 mg via ORAL
  Filled 2019-08-24 (×5): qty 1

## 2019-08-24 MED ORDER — FAMOTIDINE IN NACL 20-0.9 MG/50ML-% IV SOLN
20.0000 mg | Freq: Two times a day (BID) | INTRAVENOUS | Status: DC
  Administered 2019-08-24: 20 mg via INTRAVENOUS
  Filled 2019-08-24: qty 50

## 2019-08-24 MED ORDER — 0.9 % SODIUM CHLORIDE (POUR BTL) OPTIME
TOPICAL | Status: DC | PRN
  Administered 2019-08-24: 5000 mL

## 2019-08-24 MED ORDER — CHLORHEXIDINE GLUCONATE 0.12 % MT SOLN
15.0000 mL | OROMUCOSAL | Status: AC
  Administered 2019-08-24: 15 mL via OROMUCOSAL

## 2019-08-24 MED ORDER — CHLORHEXIDINE GLUCONATE CLOTH 2 % EX PADS
6.0000 | MEDICATED_PAD | Freq: Every day | CUTANEOUS | Status: DC
  Administered 2019-08-24: 6 via TOPICAL

## 2019-08-24 MED ORDER — OXYCODONE HCL 5 MG PO TABS: 5.0000 mg | ORAL_TABLET | ORAL | Status: DC | PRN

## 2019-08-24 MED ORDER — HEPARIN SODIUM (PORCINE) 1000 UNIT/ML IJ SOLN
INTRAMUSCULAR | Status: DC | PRN
  Administered 2019-08-24: 19000 [IU] via INTRAVENOUS

## 2019-08-24 MED ORDER — DEXTROSE 50 % IV SOLN
0.0000 mL | INTRAVENOUS | Status: DC | PRN
  Administered 2019-08-24: 30 mL via INTRAVENOUS
  Filled 2019-08-24: qty 50

## 2019-08-24 MED ORDER — SODIUM CHLORIDE 0.9% FLUSH: 10.0000 mL | INTRAVENOUS | Status: DC | PRN

## 2019-08-24 MED ORDER — ASPIRIN 81 MG PO CHEW: 324.0000 mg | CHEWABLE_TABLET | Freq: Every day | ORAL | Status: DC

## 2019-08-24 MED ORDER — PHENYLEPHRINE 40 MCG/ML (10ML) SYRINGE FOR IV PUSH (FOR BLOOD PRESSURE SUPPORT)
PREFILLED_SYRINGE | INTRAVENOUS | Status: AC
  Filled 2019-08-24: qty 10

## 2019-08-24 MED ORDER — STERILE WATER FOR INJECTION IJ SOLN
INTRAMUSCULAR | Status: AC
  Filled 2019-08-24: qty 10

## 2019-08-24 MED ORDER — ONDANSETRON HCL 4 MG/2ML IJ SOLN: 4.0000 mg | Freq: Four times a day (QID) | INTRAMUSCULAR | Status: DC | PRN

## 2019-08-24 MED ORDER — PROTAMINE SULFATE 10 MG/ML IV SOLN
INTRAVENOUS | Status: AC
  Filled 2019-08-24: qty 25

## 2019-08-24 MED ORDER — ACETAMINOPHEN 650 MG RE SUPP
650.0000 mg | Freq: Once | RECTAL | Status: AC
  Administered 2019-08-24: 650 mg via RECTAL

## 2019-08-24 MED ORDER — BUPIVACAINE LIPOSOME 1.3 % IJ SUSP
20.0000 mL | Freq: Once | INTRAMUSCULAR | Status: DC
  Filled 2019-08-24: qty 20

## 2019-08-24 MED ORDER — ARTIFICIAL TEARS OPHTHALMIC OINT
TOPICAL_OINTMENT | OPHTHALMIC | Status: DC | PRN
  Administered 2019-08-24: 1 via OPHTHALMIC

## 2019-08-24 MED ORDER — DEXMEDETOMIDINE HCL IN NACL 400 MCG/100ML IV SOLN
INTRAVENOUS | Status: DC | PRN
  Administered 2019-08-24: .4 ug/kg/h via INTRAVENOUS

## 2019-08-24 MED ORDER — NICARDIPINE HCL IN NACL 20-0.86 MG/200ML-% IV SOLN
3.0000 mg/h | INTRAVENOUS | Status: DC
  Filled 2019-08-24: qty 200

## 2019-08-24 MED ORDER — FENTANYL CITRATE (PF) 250 MCG/5ML IJ SOLN
INTRAMUSCULAR | Status: AC
  Filled 2019-08-24: qty 5

## 2019-08-24 MED ORDER — SODIUM CHLORIDE (PF) 0.9 % IJ SOLN
OROMUCOSAL | Status: DC | PRN
  Administered 2019-08-24 (×2): 4 mL via TOPICAL

## 2019-08-24 MED ORDER — PLASMA-LYTE 148 IV SOLN
INTRAVENOUS | Status: DC | PRN
  Administered 2019-08-24: 500 mL

## 2019-08-24 MED ORDER — CALCIUM CHLORIDE 10 % IV SOLN
INTRAVENOUS | Status: AC
  Filled 2019-08-24: qty 10

## 2019-08-24 MED ORDER — SODIUM CHLORIDE 0.9 % IV SOLN: INTRAVENOUS | Status: DC

## 2019-08-24 MED ORDER — ASPIRIN EC 325 MG PO TBEC
325.0000 mg | DELAYED_RELEASE_TABLET | Freq: Every day | ORAL | Status: DC
  Filled 2019-08-24: qty 1

## 2019-08-24 MED ORDER — VANCOMYCIN HCL 1000 MG IV SOLR
INTRAVENOUS | Status: DC | PRN
  Administered 2019-08-24: 3 g via TOPICAL

## 2019-08-24 MED ORDER — CHLORHEXIDINE GLUCONATE 0.12% ORAL RINSE (MEDLINE KIT): 15.0000 mL | Freq: Two times a day (BID) | OROMUCOSAL | Status: DC

## 2019-08-24 MED ORDER — BISACODYL 10 MG RE SUPP: 10.0000 mg | Freq: Every day | RECTAL | Status: DC

## 2019-08-24 MED ORDER — ROCURONIUM BROMIDE 10 MG/ML (PF) SYRINGE
PREFILLED_SYRINGE | INTRAVENOUS | Status: DC | PRN
  Administered 2019-08-24 (×2): 50 mg via INTRAVENOUS
  Administered 2019-08-24: 100 mg via INTRAVENOUS
  Administered 2019-08-24: 50 mg via INTRAVENOUS

## 2019-08-24 MED ORDER — ROCURONIUM BROMIDE 10 MG/ML (PF) SYRINGE
PREFILLED_SYRINGE | INTRAVENOUS | Status: AC
  Filled 2019-08-24: qty 40

## 2019-08-24 MED ORDER — ORAL CARE MOUTH RINSE
15.0000 mL | OROMUCOSAL | Status: DC
  Administered 2019-08-24: 15 mL via OROMUCOSAL

## 2019-08-24 MED ORDER — VANCOMYCIN HCL IN DEXTROSE 1-5 GM/200ML-% IV SOLN
1000.0000 mg | Freq: Once | INTRAVENOUS | Status: AC
  Administered 2019-08-24: 1000 mg via INTRAVENOUS
  Filled 2019-08-24: qty 200

## 2019-08-24 MED ORDER — MIDAZOLAM HCL 5 MG/5ML IJ SOLN
INTRAMUSCULAR | Status: DC | PRN
  Administered 2019-08-24: 3 mg via INTRAVENOUS
  Administered 2019-08-24: 2 mg via INTRAVENOUS
  Administered 2019-08-24: 3 mg via INTRAVENOUS
  Administered 2019-08-24: 2 mg via INTRAVENOUS

## 2019-08-24 MED ORDER — BUPIVACAINE LIPOSOME 1.3 % IJ SUSP
INTRAMUSCULAR | Status: DC | PRN
  Administered 2019-08-24: 35 mL

## 2019-08-24 MED ORDER — SODIUM CHLORIDE 0.9% FLUSH: 3.0000 mL | INTRAVENOUS | Status: DC | PRN

## 2019-08-24 MED ORDER — PHENYLEPHRINE HCL-NACL 20-0.9 MG/250ML-% IV SOLN
INTRAVENOUS | Status: DC | PRN
  Administered 2019-08-24: 25 ug/min via INTRAVENOUS

## 2019-08-24 MED ORDER — LACTATED RINGERS IV SOLN: 500.0000 mL | Freq: Once | INTRAVENOUS | Status: DC | PRN

## 2019-08-24 MED ORDER — NITROGLYCERIN IN D5W 200-5 MCG/ML-% IV SOLN: 0.0000 ug/min | INTRAVENOUS | Status: AC

## 2019-08-24 MED ORDER — NITROGLYCERIN IN D5W 200-5 MCG/ML-% IV SOLN
INTRAVENOUS | Status: DC | PRN
Start: 2019-08-24 — End: 2019-08-24
  Administered 2019-08-24: .03 mg via INTRAVENOUS

## 2019-08-24 MED ORDER — ACETAMINOPHEN 500 MG PO TABS
1000.0000 mg | ORAL_TABLET | Freq: Four times a day (QID) | ORAL | Status: AC
  Administered 2019-08-24 – 2019-08-29 (×19): 1000 mg via ORAL
  Filled 2019-08-24 (×19): qty 2

## 2019-08-24 MED ORDER — INSULIN REGULAR(HUMAN) IN NACL 100-0.9 UT/100ML-% IV SOLN: INTRAVENOUS | Status: DC

## 2019-08-24 MED ORDER — ALBUMIN HUMAN 5 % IV SOLN: 250.0000 mL | INTRAVENOUS | Status: AC | PRN

## 2019-08-24 MED ORDER — FENTANYL CITRATE (PF) 250 MCG/5ML IJ SOLN
INTRAMUSCULAR | Status: DC | PRN
  Administered 2019-08-24 (×2): 100 ug via INTRAVENOUS
  Administered 2019-08-24 (×2): 50 ug via INTRAVENOUS
  Administered 2019-08-24: 250 ug via INTRAVENOUS
  Administered 2019-08-24: 150 ug via INTRAVENOUS
  Administered 2019-08-24: 50 ug via INTRAVENOUS
  Administered 2019-08-24: 100 ug via INTRAVENOUS
  Administered 2019-08-24: 250 ug via INTRAVENOUS
  Administered 2019-08-24: 150 ug via INTRAVENOUS

## 2019-08-24 MED ORDER — SODIUM CHLORIDE 0.9% FLUSH
10.0000 mL | Freq: Two times a day (BID) | INTRAVENOUS | Status: DC
  Administered 2019-08-24 – 2019-08-26 (×3): 10 mL

## 2019-08-24 MED ORDER — MIDAZOLAM HCL (PF) 10 MG/2ML IJ SOLN
INTRAMUSCULAR | Status: AC
  Filled 2019-08-24: qty 2

## 2019-08-24 MED ORDER — MAGNESIUM SULFATE 4 GM/100ML IV SOLN
4.0000 g | Freq: Once | INTRAVENOUS | Status: AC
  Administered 2019-08-24: 4 g via INTRAVENOUS
  Filled 2019-08-24: qty 100

## 2019-08-24 MED ORDER — MIDAZOLAM HCL 2 MG/2ML IJ SOLN: 2.0000 mg | INTRAMUSCULAR | Status: DC | PRN

## 2019-08-24 MED ORDER — ACETAMINOPHEN 160 MG/5ML PO SOLN: 650.0000 mg | Freq: Once | ORAL | Status: AC

## 2019-08-24 MED ORDER — PROPOFOL 10 MG/ML IV BOLUS
INTRAVENOUS | Status: DC | PRN
  Administered 2019-08-24: 50 mg via INTRAVENOUS

## 2019-08-24 MED ORDER — SODIUM CHLORIDE 0.9 % IV SOLN: INTRAVENOUS | Status: DC | PRN

## 2019-08-24 MED ORDER — VANCOMYCIN HCL 1000 MG IV SOLR
INTRAVENOUS | Status: AC
  Filled 2019-08-24: qty 3000

## 2019-08-24 MED ORDER — PHENYLEPHRINE HCL-NACL 20-0.9 MG/250ML-% IV SOLN: 0.0000 ug/min | INTRAVENOUS | Status: DC

## 2019-08-24 MED ORDER — ORAL CARE MOUTH RINSE
15.0000 mL | Freq: Two times a day (BID) | OROMUCOSAL | Status: DC
  Administered 2019-08-24 – 2019-08-28 (×7): 15 mL via OROMUCOSAL

## 2019-08-24 MED ORDER — METOPROLOL TARTRATE 25 MG/10 ML ORAL SUSPENSION: 12.5000 mg | Freq: Two times a day (BID) | ORAL | Status: DC

## 2019-08-24 MED ORDER — DOCUSATE SODIUM 100 MG PO CAPS
200.0000 mg | ORAL_CAPSULE | Freq: Every day | ORAL | Status: DC
  Administered 2019-08-25 – 2019-08-27 (×3): 200 mg via ORAL
  Filled 2019-08-24 (×6): qty 2

## 2019-08-24 MED ORDER — SODIUM CHLORIDE 0.9 % IV SOLN: 250.0000 mL | INTRAVENOUS | Status: DC

## 2019-08-24 MED ORDER — SODIUM CHLORIDE 0.9% FLUSH
3.0000 mL | Freq: Two times a day (BID) | INTRAVENOUS | Status: DC
  Administered 2019-08-25 – 2019-08-26 (×2): 3 mL via INTRAVENOUS

## 2019-08-24 MED ORDER — METOPROLOL TARTRATE 5 MG/5ML IV SOLN
2.5000 mg | INTRAVENOUS | Status: DC | PRN
  Administered 2019-08-24: 5 mg via INTRAVENOUS
  Filled 2019-08-24: qty 5

## 2019-08-24 MED ORDER — STERILE WATER FOR INJECTION IV SOLN
INTRAVENOUS | Status: DC | PRN
  Administered 2019-08-24: 30 mL

## 2019-08-24 SURGICAL SUPPLY — 109 items
ADAPTER CARDIO PERF ANTE/RETRO (ADAPTER) ×4 IMPLANT
ADH SKN CLS APL DERMABOND .7 (GAUZE/BANDAGES/DRESSINGS) ×4
ADPR PRFSN 84XANTGRD RTRGD (ADAPTER) ×2
APL SRG 7X2 LUM MLBL SLNT (VASCULAR PRODUCTS) ×6
APPLICATOR TIP COSEAL (VASCULAR PRODUCTS) ×8 IMPLANT
APPLIER CLIP 9.375 SM OPEN (CLIP) ×4
APR CLP SM 9.3 20 MLT OPN (CLIP) ×3
BAG DECANTER FOR FLEXI CONT (MISCELLANEOUS) ×4 IMPLANT
BLADE CLIPPER SURG (BLADE) ×4 IMPLANT
BLADE STERNUM SYSTEM 6 (BLADE) ×4 IMPLANT
BLADE SURG 15 STRL LF DISP TIS (BLADE) ×6 IMPLANT
BLADE SURG 15 STRL SS (BLADE) ×8
BNDG ELASTIC 4X5.8 VLCR STR LF (GAUZE/BANDAGES/DRESSINGS) ×8 IMPLANT
BNDG ELASTIC 6X5.8 VLCR STR LF (GAUZE/BANDAGES/DRESSINGS) ×4 IMPLANT
BNDG GAUZE ELAST 4 BULKY (GAUZE/BANDAGES/DRESSINGS) ×8 IMPLANT
CANISTER SUCT 3000ML PPV (MISCELLANEOUS) ×4 IMPLANT
CANNULA NON VENT 18FR 12 (CANNULA) ×4 IMPLANT
CATH CPB KIT HENDRICKSON (MISCELLANEOUS) ×4 IMPLANT
CATH ROBINSON RED A/P 18FR (CATHETERS) ×8 IMPLANT
CLIP APPLIE 9.375 SM OPEN (CLIP) ×3 IMPLANT
CLIP RETRACTION 3.0MM CORONARY (MISCELLANEOUS) ×4 IMPLANT
CLIP VESOCCLUDE MED 24/CT (CLIP) ×4 IMPLANT
CLIP VESOCCLUDE SM WIDE 24/CT (CLIP) ×4 IMPLANT
COVER MAYO STAND STRL (DRAPES) ×4 IMPLANT
CUFF TOURN SGL QUICK 18X4 (TOURNIQUET CUFF) IMPLANT
CUFF TOURN SGL QUICK 24 (TOURNIQUET CUFF)
CUFF TRNQT CYL 24X4X16.5-23 (TOURNIQUET CUFF) IMPLANT
DERMABOND ADVANCED (GAUZE/BANDAGES/DRESSINGS) ×2
DERMABOND ADVANCED .7 DNX12 (GAUZE/BANDAGES/DRESSINGS) ×6 IMPLANT
DRAIN CHANNEL 28F RND 3/8 FF (WOUND CARE) ×12 IMPLANT
DRAPE CARDIOVASCULAR INCISE (DRAPES) ×4
DRAPE EXTREMITY T 121X128X90 (DISPOSABLE) ×4 IMPLANT
DRAPE HALF SHEET 40X57 (DRAPES) ×4 IMPLANT
DRAPE SLUSH/WARMER DISC (DRAPES) ×4 IMPLANT
DRAPE SRG 135X102X78XABS (DRAPES) ×3 IMPLANT
DRSG AQUACEL AG ADV 3.5X14 (GAUZE/BANDAGES/DRESSINGS) ×4 IMPLANT
ELECT CAUTERY BLADE 6.4 (BLADE) ×4 IMPLANT
ELECT REM PT RETURN 9FT ADLT (ELECTROSURGICAL) ×8
ELECTRODE REM PT RTRN 9FT ADLT (ELECTROSURGICAL) ×6 IMPLANT
FELT TEFLON 1X6 (MISCELLANEOUS) ×4 IMPLANT
GAUZE SPONGE 4X4 12PLY STRL (GAUZE/BANDAGES/DRESSINGS) ×8 IMPLANT
GAUZE SPONGE 4X4 12PLY STRL LF (GAUZE/BANDAGES/DRESSINGS) ×8 IMPLANT
GAUZE SPONGE 4X4 16PLY XRAY LF (GAUZE/BANDAGES/DRESSINGS) ×4 IMPLANT
GEL ULTRASOUND 20GR AQUASONIC (MISCELLANEOUS) ×4 IMPLANT
GLOVE BIO SURGEON STRL SZ 6.5 (GLOVE) ×4 IMPLANT
GLOVE BIO SURGEON STRL SZ7.5 (GLOVE) ×4 IMPLANT
GLOVE BIOGEL PI IND STRL 6 (GLOVE) ×12 IMPLANT
GLOVE BIOGEL PI INDICATOR 6 (GLOVE) ×4
GLOVE NEODERM STRL 7.5  LF PF (GLOVE) ×12
GLOVE NEODERM STRL 7.5 LF PF (GLOVE) ×12 IMPLANT
GLOVE SURG NEODERM 7.5  LF PF (GLOVE) ×4
GOWN STRL REUS W/ TWL LRG LVL3 (GOWN DISPOSABLE) ×12 IMPLANT
GOWN STRL REUS W/TWL LRG LVL3 (GOWN DISPOSABLE) ×16
HEMOSTAT POWDER SURGIFOAM 1G (HEMOSTASIS) ×8 IMPLANT
KIT BASIN OR (CUSTOM PROCEDURE TRAY) ×4 IMPLANT
KIT SUCTION CATH 14FR (SUCTIONS) ×4 IMPLANT
KIT TURNOVER KIT B (KITS) ×4 IMPLANT
KIT VASOVIEW HEMOPRO 2 VH 4000 (KITS) ×8 IMPLANT
MARKER GRAFT CORONARY BYPASS (MISCELLANEOUS) ×12 IMPLANT
NS IRRIG 1000ML POUR BTL (IV SOLUTION) ×20 IMPLANT
PACK E OPEN HEART (SUTURE) ×4 IMPLANT
PACK OPEN HEART (CUSTOM PROCEDURE TRAY) ×4 IMPLANT
PACK SPY-PHI (KITS) ×4 IMPLANT
PAD ARMBOARD 7.5X6 YLW CONV (MISCELLANEOUS) ×8 IMPLANT
PAD ELECT DEFIB RADIOL ZOLL (MISCELLANEOUS) ×4 IMPLANT
PENCIL BUTTON HOLSTER BLD 10FT (ELECTRODE) ×4 IMPLANT
POSITIONER HEAD DONUT 9IN (MISCELLANEOUS) ×4 IMPLANT
POWDER SURGICEL 3.0 GRAM (HEMOSTASIS) ×4 IMPLANT
PUNCH AORTIC ROTATE 4.5MM 8IN (MISCELLANEOUS) ×4 IMPLANT
SEALANT SURG COSEAL 8ML (VASCULAR PRODUCTS) ×4 IMPLANT
SET CARDIOPLEGIA MPS 5001102 (MISCELLANEOUS) ×4 IMPLANT
SHEARS HARMONIC 9CM CVD (BLADE) ×4 IMPLANT
STAPLER VISISTAT 35W (STAPLE) ×4 IMPLANT
SUPPORT HEART JANKE-BARRON (MISCELLANEOUS) ×4 IMPLANT
SUT BONE WAX W31G (SUTURE) ×4 IMPLANT
SUT MNCRL AB 3-0 PS2 18 (SUTURE) ×8 IMPLANT
SUT PDS AB 1 CTX 36 (SUTURE) ×8 IMPLANT
SUT PROLENE 3 0 SH DA (SUTURE) ×4 IMPLANT
SUT PROLENE 5 0 C 1 36 (SUTURE) ×4 IMPLANT
SUT PROLENE 6 0 C 1 30 (SUTURE) ×32 IMPLANT
SUT PROLENE 7 0 BV 1 (SUTURE) ×8 IMPLANT
SUT PROLENE 7 0 BV1 MDA (SUTURE) ×4 IMPLANT
SUT PROLENE 8 0 BV175 6 (SUTURE) IMPLANT
SUT PROLENE BLUE 7 0 (SUTURE) ×4 IMPLANT
SUT SILK 2 0 SH CR/8 (SUTURE) IMPLANT
SUT SILK 3 0 SH CR/8 (SUTURE) IMPLANT
SUT STEEL 6MS V (SUTURE) ×4 IMPLANT
SUT STEEL SZ 6 DBL 3X14 BALL (SUTURE) ×4 IMPLANT
SUT VIC AB 2-0 CT1 27 (SUTURE) ×8
SUT VIC AB 2-0 CT1 TAPERPNT 27 (SUTURE) ×6 IMPLANT
SUT VIC AB 2-0 CTX 27 (SUTURE) IMPLANT
SUT VIC AB 3-0 SH 27 (SUTURE)
SUT VIC AB 3-0 SH 27X BRD (SUTURE) IMPLANT
SUT VIC AB 3-0 X1 27 (SUTURE) ×4 IMPLANT
SYR 10ML LL (SYRINGE) ×4 IMPLANT
SYR 20ML LL LF (SYRINGE) ×4 IMPLANT
SYR 30ML LL (SYRINGE) ×4 IMPLANT
SYR 3ML LL SCALE MARK (SYRINGE) ×4 IMPLANT
SYR 50ML SLIP (SYRINGE) ×4 IMPLANT
SYSTEM SAHARA CHEST DRAIN ATS (WOUND CARE) ×4 IMPLANT
TAPE CLOTH SURG 4X10 WHT LF (GAUZE/BANDAGES/DRESSINGS) ×4 IMPLANT
TAPE PAPER 2X10 WHT MICROPORE (GAUZE/BANDAGES/DRESSINGS) ×4 IMPLANT
TOWEL GREEN STERILE (TOWEL DISPOSABLE) ×4 IMPLANT
TOWEL GREEN STERILE FF (TOWEL DISPOSABLE) ×4 IMPLANT
TRAY FOLEY SLVR 16FR TEMP STAT (SET/KITS/TRAYS/PACK) ×4 IMPLANT
TUBING LAP HI FLOW INSUFFLATIO (TUBING) ×4 IMPLANT
UNDERPAD 30X36 HEAVY ABSORB (UNDERPADS AND DIAPERS) ×4 IMPLANT
WATER STERILE IRR 1000ML POUR (IV SOLUTION) ×8 IMPLANT
WATER STERILE IRR 1000ML UROMA (IV SOLUTION) IMPLANT

## 2019-08-24 NOTE — Anesthesia Procedure Notes (Signed)
Arterial Line Insertion Start/End7/20/2021 7:00 AM, 08/24/2019 7:10 AM Performed by: Gaynelle Adu, MD, anesthesiologist  Patient location: Pre-op. Preanesthetic checklist: patient identified, IV checked, site marked, risks and benefits discussed, surgical consent, monitors and equipment checked, pre-op evaluation, timeout performed and anesthesia consent Lidocaine 1% used for infiltration Right, brachial was placed Catheter size: 20 G Hand hygiene performed , maximum sterile barriers used  and Seldinger technique used  Attempts: 1 Procedure performed using ultrasound guided technique. Ultrasound Notes:anatomy identified, needle tip was noted to be adjacent to the nerve/plexus identified, no ultrasound evidence of intravascular and/or intraneural injection and image(s) printed for medical record Following insertion, dressing applied, Biopatch and line sutured. Post procedure assessment: normal and unchanged  Patient tolerated the procedure well with no immediate complications.

## 2019-08-24 NOTE — Op Note (Signed)
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  Postoperative Diagnosis: Same  Procedure:    Coronary Artery Bypass Grafting x 5  Left Internal Mammary Artery to Distal Left Anterior Descending Coronary Artery; left radial artery Graft to Posterior Descending Coronary Artery; pedicled RIMA to Right PDA; Saphenous Vein Graft to distal Obtuse Marginal Branch of Left Circumflex Coronary Artery; Sapheonous Vein Graft to ramus intermedius Coronary Artery; Endoscopic Vein Harvest from right Thigh;  Open left radial artery harvesting Bilateral internal mammary artery harvesting Multilevel rib block with Exparel solution Completion graft surveillance with indocyanine green fluorescence imaging (SPY)  Surgeon: B. Lorayne Marek, MD  Assistant: Gershon Crane, PA-C  Anesthesia: get  Operative Findings:  Mildly reduced left ventricular systolic function  good quality  internal mammary artery conduits  Good quality saphenous vein conduit  Fair quality left radial artery conduit  Fair quality target vessels for grafting    BRIEF CLINICAL NOTE AND INDICATIONS FOR SURGERY  64 yo man presented to local hospital with recent development of rest chest pain. EKG suggestive of prior MI; underwent LHC demonstrating severe multivessel CAD. He was transferred to [facility] for CABG.    DETAILS OF THE OPERATIVE PROCEDURE  Preparation:  The patient is brought to the operating room on the above mentioned date and central monitoring was established by the anesthesia team including placement of Swan-Ganz catheter and radial arterial line. The patient is placed in the supine position on the operating table.  Intravenous antibiotics are administered. General endotracheal anesthesia is induced uneventfully. A Foley catheter is placed.  Baseline transesophageal echocardiogram was performed.  Findings were notable for  mildly reduced LV function; intact valves.   The patient's chest, abdomen, the left upper extremity, both groins, and both lower extremities are prepared and draped in a sterile manner. A time out procedure is performed.   Surgical Approach and Conduit Harvest:  A median sternotomy incision was performed and the left internal mammary artery is dissected from the chest wall and prepared for bypass grafting. The left internal mammary artery is notably good quality conduit. Simultaneously, the greater saphenous vein is obtained from the patient's right thigh using endoscopic vein harvest technique. The saphenous vein is notably good quality conduit. After removal of the saphenous vein, the small surgical incisions in the lower extremity are closed with absorbable suture.  Attention is turned to the left upper extremity where the left radial artery is harvested by an open technique using harmonic scalpel scalpel.  The left radial artery is fair quality due to concentric calcification that extended from the wrist to the elbow.  Once it is removed the wound is closed in layers and the arm is retucked at side.  Next attention turned the right hemithorax of the right internal mammary artery and its pedicle mobilized in standard fashion.  Prior to dividing the pedicle distally, full dose heparin was given intravenously.  Following systemic heparinization, both  internal mammary arteries were transected distally noted to have excellent flow.  They were treated with a solution of papaverine.  Multilevel rib block was undertaken with Exparel solution injected directly to the visible rib spaces through the sternotomy.   Extracorporeal Cardiopulmonary Bypass and Myocardial Protection:  The pericardium is opened. The ascending aorta is nondiseased in appearance. The ascending aorta and the right atrium are cannulated for cardiopulmonary bypass.  Adequate heparinization is verified.     Cardiopulmonary bypass was  begun and the  surface of the heart is inspected. Distal target vessels are selected for coronary artery bypass grafting. A cardioplegia cannula is placed in the ascending aorta.   The patient is allowed to cool passively to 34C systemic temperature.  The aortic cross clamp is applied and cold blood cardioplegia is delivered initially in an antegrade fashion through the aortic root.  Iced saline slush is applied for topical hypothermia.  The initial cardioplegic arrest is rapid with early diastolic arrest.  Repeat doses of cardioplegia are administered intermittently throughout the entire cross clamp portion of the operation through the aortic root  and through subsequently placed vein grafts in order to maintain completely flat electrocardiogram.  Coronary Artery Bypass Grafting:   The  posterior descending branch of the right coronary artery was grafted using the left radial artery graft in an end-to-side fashion.  At the site of distal anastomosis the target vessel was fair quality and measured approximately 1.5 mm in diameter.  The distal obtuse marginal branch of the left circumflex coronary artery was grafted using a reversed saphenous vein graft in an end-to-side fashion.  At the site of distal anastomosis the target vessel was good quality and measured approximately 1.5 mm in diameter.  The ramus intermedius branch of the left coronary artery was grafted using a reversed saphenous vein graft in an end-to-side fashion.  At the site of distal anastomosis the target vessel was fair quality and measured approximately 1.5 mm in diameter.  The posterior lateral branch of the right coronary artery was grafted in and end-to-side fashion using the pedicled right internal mammary artery.  At the site of distal anastomosis the target vessel was fair quality and measured approximately 1.5 mm in diameter. Anastomotic patency and runoff was confirmed with indocyanine green fluorescence imaging (SPY).  The  distal left anterior coronary artery was grafted with the left internal mammary artery in an end-to-side fashion.  At the site of distal anastomosis the target vessel was good quality and measured approximately 1.5 mm in diameter.Anastomotic patency and runoff was confirmed with indocyanine green fluorescence imaging (SPY).  All proximal vein graft anastomoses were placed directly to the ascending aorta prior to removal of the aortic cross clamp.  De-airing procedures were performed and the aortic cross-clamp was removed   Procedure Completion:  All proximal and distal coronary anastomoses were inspected for hemostasis and appropriate graft orientation. Epicardial pacing wires are fixed to the right ventricular outflow tract and to the right atrial appendage. The patient is rewarmed to 37C temperature. The patient is weaned and disconnected from cardiopulmonary bypass.  The patient's rhythm at separation from bypass was sinus.  The patient was weaned from cardiopulmonary bypass  without any inotropic support.  Followup transesophageal echocardiogram performed after separation from bypass revealed no changes from the preoperative exam.  The aortic and venous cannula were removed uneventfully. Protamine was administered to reverse the anticoagulation. The mediastinum and pleural space were inspected for hemostasis and irrigated with saline solution. The mediastinum and bilateral pleural spaces were drained using fluted chest tubes placed through separate stab incisions inferiorly.  The soft tissues anterior to the aorta were reapproximated loosely. The sternum is closed with double strength sternal wire. The soft tissues anterior to the sternum were closed in multiple layers and the skin is closed with a running subcuticular skin closure.  The post-bypass portion of the operation was notable for stable rhythm and hemodynamics.  No blood products were administered during the  operation.   Disposition:  The patient  tolerated the procedure well and is transported to the surgical intensive care in stable condition. There are no intraoperative complications. All sponge instrument and needle counts are verified correct at completion of the operation.    Brantley Fling, MD 08/24/2019 2:12 PM

## 2019-08-24 NOTE — Consult Note (Signed)
[address]       [place] [address]             [phone]        [facility] Medical Record #[id number] Date of Birth: Feb 08, 1955  Referring: No ref. provider found Primary Care: Patient, No Pcp Per Primary Cardiologist:Robert Bing Matter, MD  Chief Complaint:   No chief complaint on file.   History of Present Illness:     64 yo man with h/o DM experienced one episode of left sided CP radiating to LUE last week which woke him from sleep. He reported the sx to primary care later in the week, who ordered EKG. Based on findings suspicious for CAD, he underwent LHC last Friday, demonstrating severe multivessel CAD. He is transferred to [facility] for CABG. LV function at the time of the LHC was severely reduced, now improved incrementally by 2-D echo. He denies CP or SOB now.   Current Activity/ Functional Status: Patient will be independent with mobility/ambulation, transfers, ADL's, IADL's.   Zubrod Score: At the time of surgery this patient's most appropriate activity status/level should be described as: [x]     0    Normal activity, no symptoms []     1    Restricted in physical strenuous activity but ambulatory, able to do out light work []     2    Ambulatory and capable of self care, unable to do work activities, up and about                 more than 50%  Of the time                            []     3    Only limited self care, in bed greater than 50% of waking hours []     4    Completely disabled, no self care, confined to bed or chair []     5    Moribund  Past Medical History:  Diagnosis Date  . Anxiety   . Arthritis   . Depression   . Diabetes mellitus without complication (HCC)   . History of kidney stones   . Hypertension     Past Surgical History:  Procedure Laterality Date  . ANTERIOR CERVICAL DECOMP/DISCECTOMY FUSION N/A 07/31/2016   Procedure: ANTERIOR CERVICAL DECOMPRESSION/DISCECTOMY FUSION CERVICAL FOUR- CERVICAL FIVE, CERVICAL  FIVE- CERVICAL SIX, CERVICAL SIX, CERVICAL SEVEN;  Surgeon: , MD;  Location: [facility] OR;  Service: Neurosurgery;  Laterality: N/A;  . DG THUMB RIGHT HAND ([facility] HX) Left    plate  . EYE SURGERY Bilateral    cataract removed  . LEFT HEART CATH AND CORONARY ANGIOGRAPHY N/A 08/20/2019   Procedure: LEFT HEART CATH AND CORONARY ANGIOGRAPHY;  Surgeon: , MD;  Location: [facility] INVASIVE CV LAB;  Service: Cardiovascular;  Laterality: N/A;  . PENILE PROSTHESIS IMPLANT      Social History   Tobacco Use  Smoking Status Never Smoker  Smokeless Tobacco Never Used    Social History   Substance and Sexual Activity  Alcohol Use No     No Known Allergies  Current Facility-Administered Medications  Medication Dose Route Frequency Provider Last Rate Last Admin  . [MAR Hold] 0.9 %  sodium chloride infusion  250 mL Intravenous PRN 08/02/2016, MD      . Shirlean Kelly Hold] acetaminophen (TYLENOL) tablet 650 mg  650 mg  Oral Q4H PRN Marykay Lex, MD      . Mitzi Hansen Hold] aspirin EC tablet 81 mg  81 mg Oral Consuelo Pandy, MD   81 mg at 08/23/19 0454  . [MAR Hold] atorvastatin (LIPITOR) tablet 80 mg  80 mg Oral Daily Marykay Lex, MD   80 mg at 08/23/19 0842  . cefUROXime (ZINACEF) 1.5 g in sodium chloride 0.9 % 100 mL IVPB  1.5 g Intravenous To OR Javante Nilsson Z, MD      . cefUROXime (ZINACEF) 750 mg in sodium chloride 0.9 % 100 mL IVPB  750 mg Intravenous To OR Leala Bryand Z, MD      . Chlorhexidine Gluconate Cloth 2 % PADS 6 each  6 each Topical Once Linden Dolin, MD      . Mitzi Hansen Hold] Chlorhexidine Gluconate Cloth 2 % PADS 6 each  6 each Topical Q0600 Linden Dolin, MD   6 each at 08/24/19 0515  . [MAR Hold] desvenlafaxine (PRISTIQ) 24 hr tablet 100 mg  100 mg Oral Consuelo Pandy, MD   100 mg at 08/23/19 0981  . dexmedetomidine (PRECEDEX) 400 MCG/100ML (4 mcg/mL) infusion  0.1-0.7 mcg/kg/hr Intravenous To OR Indi Willhite Z, MD      . EPINEPHrine  (ADRENALIN) 4 mg in NS 250 mL (0.016 mg/mL) premix infusion  0-10 mcg/min Intravenous To OR Syriana Croslin, Merri Brunette, MD      . Mitzi Hansen Hold] gabapentin (NEURONTIN) capsule 900 mg  900 mg Oral Consuelo Pandy, MD   900 mg at 08/23/19 1914  . heparin 30,000 units/NS 1000 mL solution for CELLSAVER   Other To OR Samiyah Stupka, Merri Brunette, MD      . heparin ADULT infusion 100 units/mL (25000 units/266mL sodium chloride 0.45%)  1,000 Units/hr Intravenous Continuous Marykay Lex, MD 10 mL/hr at 08/23/19 0102 1,000 Units/hr at 08/23/19 0102  . heparin sodium (porcine) 2,500 Units, papaverine 30 mg in electrolyte-148 (PLASMALYTE-148) 500 mL irrigation   Irrigation To OR Hannan Hutmacher, Merri Brunette, MD      . Mitzi Hansen Hold] HYDROcodone-acetaminophen (NORCO/VICODIN) 5-325 MG per tablet 1-2 tablet  1-2 tablet Oral Q4H PRN Marykay Lex, MD      . Mitzi Hansen Hold] insulin aspart (novoLOG) injection 0-15 Units  0-15 Units Subcutaneous TID WC Marykay Lex, MD   3 Units at 08/23/19 1804  . [MAR Hold] insulin glargine (LANTUS) injection 20 Units  20 Units Subcutaneous QHS Marykay Lex, MD   20 Units at 08/23/19 [phone]  . insulin regular, human (MYXREDLIN) 100 units/ 100 mL infusion   Intravenous To OR Barbi Kumagai Z, MD      . magnesium sulfate (IV Push/IM) injection 40 mEq  40 mEq Other To OR Lalita Ebel, Merri Brunette, MD      . metoprolol tartrate (LOPRESSOR) tablet 12.5 mg  12.5 mg Oral Once Linden Dolin, MD      . Mitzi Hansen Hold] metoprolol tartrate (LOPRESSOR) tablet 25 mg  25 mg Oral BID Marykay Lex, MD   25 mg at 08/23/19 2206  . milrinone (PRIMACOR) 20 MG/100 ML (0.2 mg/mL) infusion  0.3 mcg/kg/min Intravenous To OR Abrar Bilton, Merri Brunette, MD      . Mitzi Hansen Hold] mupirocin ointment (BACTROBAN) 2 % 1 application  1 application Nasal BID Marykay Lex, MD   1 application at 08/23/19 [phone]  . [MAR Hold] nitroGLYCERIN 50 mg in dextrose 5 % 250 mL (0.2 mg/mL) infusion  0-200 mcg/min Intravenous Titrated  Marykay Lex, MD   Stopped at  08/24/19 [phone]  . nitroGLYCERIN 50 mg in dextrose 5 % 250 mL (0.2 mg/mL) infusion  2-200 mcg/min Intravenous To OR Aimie Wagman Z, MD      . norepinephrine (LEVOPHED) 4mg  in premix infusion  0-40 mcg/min Intravenous To OR Linden Dolin, MD      . Mitzi Hansen Hold] ondansetron (ZOFRAN) injection 4 mg  4 mg Intravenous Q6H PRN Marykay Lex, MD      . phenylephrine (NEOSYNEPHRINE) 20-0.9 MG/250ML-% infusion  30-200 mcg/min Intravenous To OR Ricquel Foulk Z, MD      . potassium chloride injection 80 mEq  80 mEq Other To OR Jullianna Gabor, Merri Brunette, MD      . Mitzi Hansen Hold] sodium chloride flush (NS) 0.9 % injection 3 mL  3 mL Intravenous Q12H Marykay Lex, MD   3 mL at 08/23/19 0843  . [MAR Hold] sodium chloride flush (NS) 0.9 % injection 3 mL  3 mL Intravenous PRN Marykay Lex, MD      . temazepam (RESTORIL) capsule 15 mg  15 mg Oral Once PRN Jorryn Hershberger, Merri Brunette, MD      . tranexamic acid (CYKLOKAPRON) 2,500 mg in sodium chloride 0.9 % 250 mL (10 mg/mL) infusion  1.5 mg/kg/hr Intravenous To OR Bryttani Blew, Merri Brunette, MD      . tranexamic acid (CYKLOKAPRON) bolus via infusion - over 30 minutes 945 mg  15 mg/kg Intravenous To OR Robena Ewy Z, MD      . tranexamic acid (CYKLOKAPRON) pump prime solution 126 mg  2 mg/kg Intracatheter To OR Fidelis Loth Z, MD      . vancomycin (VANCOREADY) IVPB 1250 mg/250 mL  1,250 mg Intravenous To OR Shandon Matson, Merri Brunette, MD       Facility-Administered Medications Ordered in Other Encounters  Medication Dose Route Frequency Provider Last Rate Last Admin  . fentaNYL citrate (PF) (SUBLIMAZE) injection   Intravenous Anesthesia Intra-op Jodell Cipro, CRNA   50 mcg at 08/24/19 4982  . midazolam (VERSED) 5 MG/5ML injection   Intravenous Anesthesia Intra-op Jodell Cipro, CRNA   2 mg at 08/24/19 6415    Medications Prior to Admission  Medication Sig Dispense Refill Last Dose  . atorvastatin (LIPITOR) 40 MG tablet Take 40 mg by mouth every morning.   08/20/2019 at  Unknown time  . desvenlafaxine (PRISTIQ) 100 MG 24 hr tablet Take 100 mg by mouth every evening.    08/19/2019 at Unknown time  . famotidine (PEPCID) 40 MG tablet Take 40 mg by mouth daily.   08/20/2019 at Unknown time  . Ferrous Sulfate (SLOW FE PO) Take 1 tablet by mouth every other day.   08/20/2019 at Unknown time  . gabapentin (NEURONTIN) 300 MG capsule Take 900 mg by mouth every evening.    08/19/2019 at Unknown time  . HYDROcodone-acetaminophen (NORCO/VICODIN) 5-325 MG tablet Take 1-2 tablets by mouth every 4 (four) hours as needed for moderate pain. 40 tablet 0 Past Week at Unknown time  . insulin aspart (NOVOLOG) 100 UNIT/ML injection Inject 3-15 Units into the skin 3 (three) times daily before meals.   08/19/2019 at Unknown time  . Insulin Degludec (TRESIBA) 100 UNIT/ML SOLN Inject 20 Units into the skin daily.   08/19/2019 at Unknown time  . Multiple Vitamins-Minerals (OCUVITE PRESERVISION PO) Take 1 tablet by mouth in the morning and at bedtime.   08/20/2019 at Unknown time  . VRAYLAR capsule Take 3 mg by mouth  at bedtime.   08/19/2019 at Unknown time  . ARIPiprazole (ABILIFY) 5 MG tablet Take 5 mg by mouth every morning. (Patient not taking: Reported on 08/20/2019)   Not Taking    History reviewed. No pertinent family history.   Review of Systems:   ROS Pertinent items are noted in HPI.     Cardiac Review of Systems: Y or  [    ]= no  Chest Pain [    ]  Resting SOB [   ] Exertional SOB  [  ]  Orthopnea [  ]   Pedal Edema [   ]    Palpitations [  ] Syncope  [  ]   Presyncope [   ]  General Review of Systems: [Y] = yes [  ]=no Constitional: recent weight change [  ]; anorexia [  ]; fatigue [  ]; nausea [  ]; night sweats [  ]; fever [  ]; or chills [  ]                                                               Dental: Last Dentist visit:   Eye : blurred vision [  ]; diplopia [   ]; vision changes [  ];  Amaurosis fugax[  ]; Resp: cough [  ];  wheezing[  ];  hemoptysis[  ]; shortness  of breath[  ]; paroxysmal nocturnal dyspnea[  ]; dyspnea on exertion[  ]; or orthopnea[  ];  GI:  gallstones[  ], vomiting[  ];  dysphagia[  ]; melena[  ];  hematochezia [  ]; heartburn[  ];   Hx of  Colonoscopy[  ]; GU: kidney stones [  ]; hematuria[  ];   dysuria [  ];  nocturia[  ];  history of     obstruction [  ]; urinary frequency [  ]             Skin: rash, swelling[  ];, hair loss[  ];  peripheral edema[  ];  or itching[  ]; Musculosketetal: myalgias[  ];  joint swelling[  ];  joint erythema[  ];  joint pain[  ];  back pain[  ];  Heme/Lymph: bruising[  ];  bleeding[  ];  anemia[  ];  Neuro: TIA[  ];  headaches[  ];  stroke[  ];  vertigo[  ];  seizures[  ];   paresthesias[  ];  difficulty walking[  ];  Psych:depression[  ]; anxiety[  ];  Endocrine: diabetes[  ];  thyroid dysfunction[  ];           Physical Exam: BP (!) 119/50   Pulse (!) 51   Temp 98.5 F (36.9 C) (Oral)   Resp 16   Ht 5\' 4"  (1.626 m)   Wt 62.1 kg   SpO2 96%   BMI 23.50 kg/m    General appearance: alert and cooperative Head: Normocephalic, without obvious abnormality, atraumatic Neck: no adenopathy, no carotid bruit, no JVD, supple, symmetrical, trachea midline and thyroid not enlarged, symmetric, no tenderness/mass/nodules Resp: clear to auscultation bilaterally Cardio: regular rate and rhythm, S1, S2 normal, no murmur, click, rub or gallop GI: soft, non-tender; bowel sounds normal; no masses,  no organomegaly Extremities: extremities normal, atraumatic, no cyanosis or edema Neurologic:  Alert and oriented X 3, normal strength and tone. Normal symmetric reflexes. Normal coordination and gait I have personally performed an examination on his LUE with plethysmography to assess suitability of left radial harvesting.  Diagnostic Studies & Laboratory data:     Recent Radiology Findings:   VAS US DOPPLER PRE CABG  Result Date: 08/22/2019 PREOPERATIVE VASCULAR EVALUATION  Indications:      Pre-CABG. Risk  Factors:     Hypertension, hyperlipidemia, Diabetes. Comparison Study: No prior studies. Performing Technologist: Jean Rosenthal Supporting Technologist: Sherren Kerns RVS  Examination Guidelines: A complete evaluation includes B-mode imaging, spectral Doppler, color Doppler, and power Doppler as needed of all accessible portions of each vessel. Bilateral testing is considered an integral part of a complete examination. Limited examinations for reoccurring indications may be performed as noted.  Right Carotid Findings: +----------+--------+--------+--------+---------------------+------------------+           PSV cm/sEDV cm/sStenosisDescribe             Comments           +----------+--------+--------+--------+---------------------+------------------+ CCA Prox  59      12                                   intimal thickening +----------+--------+--------+--------+---------------------+------------------+ CCA Distal49      13              focal and                                                                 heterogenous                            +----------+--------+--------+--------+---------------------+------------------+ ICA Prox  77      24      1-39%                                           +----------+--------+--------+--------+---------------------+------------------+ ICA Distal49      16                                                      +----------+--------+--------+--------+---------------------+------------------+ ECA       82      16                                                      +----------+--------+--------+--------+---------------------+------------------+ Portions of this table do not appear on this page. +----------+--------+-------+----------------+------------+           PSV cm/sEDV cmsDescribe        Arm Pressure +----------+--------+-------+----------------+------------+ Subclavian100            Multiphasic, WNL              +----------+--------+-------+----------------+------------+ +---------+--------+--+--------+--+--------+ VertebralPSV cm/s47EDV cm/s30Atypical +---------+--------+--+--------+--+--------+  Left Carotid Findings: +----------+--------+--------+--------+--------+------------------+           PSV cm/sEDV cm/sStenosisDescribeComments           +----------+--------+--------+--------+--------+------------------+ CCA Prox  63      16                      intimal thickening +----------+--------+--------+--------+--------+------------------+ CCA Distal75      16                      intimal thickening +----------+--------+--------+--------+--------+------------------+ ICA Prox  69      25      1-39%                              +----------+--------+--------+--------+--------+------------------+ ICA Distal50      18                                         +----------+--------+--------+--------+--------+------------------+ ECA       97      13                                         +----------+--------+--------+--------+--------+------------------+ +----------+--------+--------+----------------+------------+ SubclavianPSV cm/sEDV cm/sDescribe        Arm Pressure +----------+--------+--------+----------------+------------+           113             Multiphasic, WNL             +----------+--------+--------+----------------+------------+ +---------+--------+--+--------+--+---------+ VertebralPSV cm/s49EDV cm/s10Antegrade +---------+--------+--+--------+--+---------+  ABI Findings: +---------+------------------+-----+---------+--------+ Right    Rt Pressure (mmHg)IndexWaveform Comment  +---------+------------------+-----+---------+--------+ Brachial 177                    triphasic         +---------+------------------+-----+---------+--------+ PTA                             triphasicNC        +---------+------------------+-----+---------+--------+ DP                              triphasicNC       +---------+------------------+-----+---------+--------+ Great Toe121               0.68 Abnormal          +---------+------------------+-----+---------+--------+ +---------+------------------+-----+---------+-------+ Left     Lt Pressure (mmHg)IndexWaveform Comment +---------+------------------+-----+---------+-------+ Brachial 139                    triphasic        +---------+------------------+-----+---------+-------+ PTA      226               1.28 triphasic        +---------+------------------+-----+---------+-------+ DP                              triphasicNC      +---------+------------------+-----+---------+-------+ Great Toe84                0.47 Abnormal         +---------+------------------+-----+---------+-------+ +-------+---------------+----------------+ ABI/TBIToday's ABI/TBIPrevious ABI/TBI +-------+---------------+----------------+ Left   1.28                            +-------+---------------+----------------+  Arterial wall calcification precludes accurate ankle pressures and ABIs.  Right Doppler Findings: +--------+--------+-----+----------+---------------------------------+ Site    PressureIndexDoppler   Comments                          +--------+--------+-----+----------+---------------------------------+ [id number]          triphasic                                   +--------+--------+-----+----------+---------------------------------+ Radial               monophasicPatient had recent RT radial cath +--------+--------+-----+----------+---------------------------------+ Ulnar                triphasic                                   +--------+--------+-----+----------+---------------------------------+  Left Doppler Findings: +--------+--------+-----+---------+--------+ Site    PressureIndexDoppler   Comments +--------+--------+-----+---------+--------+ [id number]          triphasic         +--------+--------+-----+---------+--------+ Radial               triphasic         +--------+--------+-----+---------+--------+ Ulnar                triphasic         +--------+--------+-----+---------+--------+  Summary: Right Carotid: Velocities in the right ICA are consistent with a 1-39% stenosis.                Non-hemodynamically significant plaque <50% noted in the CCA. Left Carotid: Velocities in the left ICA are consistent with a 1-39% stenosis. Vertebrals:  Left vertebral artery demonstrates antegrade flow. Abnormal,              blunted waveform in RT vertebral. >107mmHg difference noted in              brachial pressures. Subclavians: Normal flow hemodynamics were seen in bilateral subclavian              arteries. Right ABI: Resting right ankle-brachial index indicates noncompressible right lower extremity arteries. The right toe-brachial index is abnormal. Left ABI: Resting left ankle-brachial index indicates noncompressible left lower extremity arteries. The left toe-brachial index is abnormal. Right Upper Extremity: Doppler waveforms decrease <50% with right radial compression. Doppler waveform obliterate with right ulnar compression. Left Upper Extremity: Doppler waveforms remain within normal limits with left radial compression. Doppler waveforms remain within normal limits with left ulnar compression.  Electronically signed by Fabienne Bruns MD on 08/22/2019 at 12:59:46 PM.    Final      I have independently reviewed the above radiologic studies and discussed with the patient   Recent Lab Findings: Lab Results  Component Value Date   WBC 4.8 08/24/2019   HGB 10.9 (L) 08/24/2019   HCT 34.3 (L) 08/24/2019   PLT 206 08/24/2019   GLUCOSE 124 (H) 08/24/2019   CHOL 86 08/21/2019   TRIG 40 08/21/2019   HDL 30 (L) 08/21/2019   LDLCALC 48 08/21/2019   ALT 9 08/21/2019   AST 13  (L) 08/21/2019   NA 140 08/24/2019   K 3.9 08/24/2019   CL 104 08/24/2019   CREATININE 1.22 08/24/2019   BUN 18 08/24/2019   CO2 26 08/24/2019   INR 1.3 (H) 08/21/2019   HGBA1C 9.7 (H) 08/24/2019  Assessment / Plan:     CABG on 08/24/19;  I have discussed the risks and benefits of the operation with Mr. Swoveland, and he wishes to proceed     I  spent 40 minutes counseling the patient face to face.   @ME1 @ 08/24/2019 6:57 AM

## 2019-08-24 NOTE — Plan of Care (Signed)
  Problem: Education: Goal: Knowledge of General Education information will improve Description: Including pain rating scale, medication(s)/side effects and non-pharmacologic comfort measures Outcome: Progressing   Problem: Health Behavior/Discharge Planning: Goal: Ability to manage health-related needs will improve Outcome: Progressing   Problem: Clinical Measurements: Goal: Ability to maintain clinical measurements within normal limits will improve Outcome: Progressing Goal: Will remain free from infection Outcome: Progressing Goal: Diagnostic test results will improve Outcome: Progressing Goal: Respiratory complications will improve Outcome: Progressing Goal: Cardiovascular complication will be avoided Outcome: Progressing   Problem: Activity: Goal: Risk for activity intolerance will decrease Outcome: Progressing   Problem: Nutrition: Goal: Adequate nutrition will be maintained Outcome: Progressing   Problem: Coping: Goal: Level of anxiety will decrease Outcome: Progressing   Problem: Elimination: Goal: Will not experience complications related to bowel motility Outcome: Progressing Goal: Will not experience complications related to urinary retention Outcome: Progressing   Problem: Pain Managment: Goal: General experience of comfort will improve Outcome: Progressing   Problem: Safety: Goal: Ability to remain free from injury will improve Outcome: Progressing   Problem: Skin Integrity: Goal: Risk for impaired skin integrity will decrease Outcome: Progressing   Problem: Education: Goal: Individualized Educational Video(s) Outcome: Progressing   Problem: Health Behavior/Discharge Planning: Goal: Ability to safely manage health-related needs after discharge will improve Outcome: Progressing

## 2019-08-24 NOTE — Procedures (Signed)
Extubation Procedure Note  Patient Details:   Name: Jonathan Holder DOB: 01/15/1956 MRN: [id number]   Airway Documentation:    Vent end date: 08/24/19 Vent end time: 1740   Evaluation  O2 sats: stable throughout Complications: No apparent complications Patient did tolerate procedure well. Bilateral Breath Sounds: Coarse crackles   Patient extubated per SICU wean protocol & placed on 4L [place]. Patient met all weaning parameters prior to extubation. Patient able to speak & cough well post extubation. IS instructed 500 x 5.  Kathie Dike 08/24/2019, 5:47 PM

## 2019-08-24 NOTE — Progress Notes (Signed)
  Echocardiogram Echocardiogram Transesophageal has been performed.  Celene Skeen 08/24/2019, 8:53 AM

## 2019-08-24 NOTE — Anesthesia Postprocedure Evaluation (Signed)
Anesthesia Post Note  Patient: Jonathan Holder  Procedure(s) Performed: CORONARY ARTERY BYPASS GRAFTING (CABG), ON PUMP, TIMES FIVE, USING BILATERAL INTERNAL MAMMARY ARTERIES, ENDOSCOPICALLY HARVESTED RIGHT GREATER SAPHENOUS VEIN, AND OPEN LEFT RADIAL ARTERY HARVESTING (N/A Chest) RADIAL ARTERY HARVEST (Left Arm Lower) TRANSESOPHAGEAL ECHOCARDIOGRAM (TEE) (N/A ) INDOCYANINE GREEN FLUORESCENCE IMAGING (ICG) (N/A )     Patient location during evaluation: SICU Anesthesia Type: General Level of consciousness: sedated Pain management: pain level controlled Vital Signs Assessment: post-procedure vital signs reviewed and stable Respiratory status: patient remains intubated per anesthesia plan Cardiovascular status: stable Postop Assessment: no apparent nausea or vomiting Anesthetic complications: no   No complications documented.  Last Vitals:  Vitals:   08/24/19 0632 08/24/19 1416  BP:    Pulse: 65   Resp: 18   Temp:    SpO2: 90% 100%    Last Pain:  Vitals:   08/24/19 0421  TempSrc: Oral  PainSc:                  Keedan Sample,W. EDMOND

## 2019-08-24 NOTE — Progress Notes (Signed)
Patient ID: Jonathan Holder, male   DOB: 08/10/1955, 64 y.o.   MRN: [id number] EVENING ROUNDS NOTE :     [address]       [place] [address]             [phone]                 Day of Surgery Procedure(s) (LRB): CORONARY ARTERY BYPASS GRAFTING (CABG), ON PUMP, TIMES FIVE, USING BILATERAL INTERNAL MAMMARY ARTERIES, ENDOSCOPICALLY HARVESTED RIGHT GREATER SAPHENOUS VEIN, AND OPEN LEFT RADIAL ARTERY HARVESTING (N/A) RADIAL ARTERY HARVEST (Left) TRANSESOPHAGEAL ECHOCARDIOGRAM (TEE) (N/A) INDOCYANINE GREEN FLUORESCENCE IMAGING (ICG) (N/A)  Total Length of Stay:  LOS: 4 days  BP (!) 109/55   Pulse 89   Temp 99.9 F (37.7 C)   Resp (!) 22   Ht 5\' 4"  (1.626 m)   Wt 62.1 kg   SpO2 98%   BMI 23.50 kg/m   .Intake/Output      07/19 0701 - 07/20 0700 07/20 0701 - 07/21 0700   P.O. 360    I.V. (mL/kg) 273 (4.4) 1709.4 (27.5)   Blood  415   IV Piggyback 0 1018.7   Total Intake(mL/kg) 633 (10.2) 3143.1 (50.6)   Urine (mL/kg/hr) 975 (0.7) 3425 (5)   Emesis/NG output  50   Blood  830   Chest Tube  140   Total Output 975 4445   Net -342 -1302          . sodium chloride Stopped (08/24/19 1625)  . [START ON 08/25/2019] sodium chloride    . sodium chloride 20 mL/hr at 08/24/19 1410  . albumin human    . cefUROXime (ZINACEF)  IV    . dexmedetomidine (PRECEDEX) IV infusion Stopped (08/24/19 1604)  . famotidine (PEPCID) IV Stopped (08/24/19 1511)  . insulin Stopped (08/24/19 1507)  . lactated ringers    . lactated ringers    . lactated ringers 20 mL/hr at 08/24/19 1700  . magnesium sulfate 20 mL/hr at 08/24/19 1700  . niCARDipine Stopped (08/24/19 1609)  . nitroGLYCERIN 40 mcg/min (08/24/19 1700)  . potassium chloride 10 mEq (08/24/19 1732)  . vancomycin       Lab Results  Component Value Date   WBC 6.1 08/24/2019   HGB 10.5 (L) 08/24/2019   HCT 32.1 (L) 08/24/2019   PLT 149 (L) 08/24/2019   GLUCOSE 109 (H) 08/24/2019   CHOL 86 08/21/2019   TRIG 40  08/21/2019   HDL 30 (L) 08/21/2019   LDLCALC 48 08/21/2019   ALT 9 08/21/2019   AST 13 (L) 08/21/2019   NA 143 08/24/2019   K 3.8 08/24/2019   CL 102 08/24/2019   CREATININE 0.80 08/24/2019   BUN 14 08/24/2019   CO2 26 08/24/2019   INR 1.6 (H) 08/24/2019   HGBA1C 9.7 (H) 08/24/2019   CABG x 5 done today Extubated Neuro intact Not bleeding On EPI 1    08/26/2019 MD  Beeper [phone] Office [phone] 08/24/2019 5:56 PM

## 2019-08-24 NOTE — Transfer of Care (Signed)
Immediate Anesthesia Transfer of Care Note  Patient: Jonathan Holder  Procedure(s) Performed: CORONARY ARTERY BYPASS GRAFTING (CABG), ON PUMP, TIMES FIVE, USING BILATERAL INTERNAL MAMMARY ARTERIES, ENDOSCOPICALLY HARVESTED RIGHT GREATER SAPHENOUS VEIN, AND OPEN LEFT RADIAL ARTERY HARVESTING (N/A Chest) RADIAL ARTERY HARVEST (Left Arm Lower) TRANSESOPHAGEAL ECHOCARDIOGRAM (TEE) (N/A ) INDOCYANINE GREEN FLUORESCENCE IMAGING (ICG) (N/A )  Patient Location: SICU  Anesthesia Type:General  Level of Consciousness: Patient remains intubated per anesthesia plan  Airway & Oxygen Therapy: Patient remains intubated per anesthesia plan and Patient placed on Ventilator (see vital sign flow sheet for setting)  Post-op Assessment: Report given to RN and Post -op Vital signs reviewed and stable  Post vital signs: Reviewed and stable  Last Vitals:  Vitals Value Taken Time  BP    Temp    Pulse    Resp    SpO2      Last Pain:  Vitals:   08/24/19 0421  TempSrc: Oral  PainSc:       Patients Stated Pain Goal: 0 (08/23/19 1215)  Complications: No complications documented.

## 2019-08-24 NOTE — Progress Notes (Signed)
Nitro gtt stopped at 0430 d/t SBP 91. IV Heparin off at 0530 per pre op orders. CBG 106. Pre Op Lopressor 12.5mg  held d/t HR 51. Pt stable upon transport to Pre OP area and report given to CRNA at bedside. Dierdre Highman, RN

## 2019-08-24 NOTE — H&P (Signed)
History and Physical Interval Note:  08/24/2019 7:01 AM  Jonathan Holder  has presented today for surgery, with the diagnosis of CAD.  The various methods of treatment have been discussed with the patient and family. After consideration of risks, benefits and other options for treatment, the patient has consented to  Procedure(s): CORONARY ARTERY BYPASS GRAFTING (CABG), bilateral IMA (N/A) RADIAL ARTERY HARVEST (Left) TRANSESOPHAGEAL ECHOCARDIOGRAM (TEE) (N/A) INDOCYANINE GREEN FLUORESCENCE IMAGING (ICG) (N/A) as a surgical intervention.  The patient's history has been reviewed, patient examined, no change in status, stable for surgery.  I have reviewed the patient's chart and labs.  Questions were answered to the patient's satisfaction.     Linden Dolin

## 2019-08-24 NOTE — Anesthesia Procedure Notes (Addendum)
Procedure Name: Intubation Date/Time: 08/24/2019 8:04 AM Performed by: Jodell Cipro, CRNA Pre-anesthesia Checklist: Patient identified, Emergency Drugs available, Suction available and Patient being monitored Patient Re-evaluated:Patient Re-evaluated prior to induction Oxygen Delivery Method: Circle System Utilized Preoxygenation: Pre-oxygenation with 100% oxygen Induction Type: IV induction Ventilation: Oral airway inserted - appropriate to patient size and Two handed mask ventilation required Laryngoscope Size: Miller and 2 Grade View: Grade II Tube type: Oral Tube size: 8.0 mm Number of attempts: 2 Airway Equipment and Method: Stylet and Oral airway Placement Confirmation: ETT inserted through vocal cords under direct vision,  positive ETCO2 and breath sounds checked- equal and bilateral Secured at: 22 cm Tube secured with: Tape Dental Injury: Teeth and Oropharynx as per pre-operative assessment  Difficulty Due To: Difficult Airway- due to anterior larynx Comments: DL x2 by srna with inability to visualize cords. Mask ventilated with OA in between attempts with 100% Spo2. DL x1 by CRNA with grade II view and successful intubation

## 2019-08-24 NOTE — Brief Op Note (Signed)
08/20/2019 - 08/24/2019  12:45 PM  PATIENT:  Jonathan Holder  64 y.o. 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  POST-OPERATIVE DIAGNOSIS:  same  PROCEDURE:  Procedure(s): CORONARY ARTERY BYPASS GRAFTING (CABG), ON PUMP, TIMES FIVE, USING BILATERAL INTERNAL MAMMARY ARTERIES, ENDOSCOPICALLY HARVESTED RIGHT GREATER SAPHENOUS VEIN, AND LEFT RADIAL ARTERY (N/A) RADIAL ARTERY HARVEST (Left) TRANSESOPHAGEAL ECHOCARDIOGRAM (TEE) (N/A) INDOCYANINE GREEN FLUORESCENCE IMAGING (ICG) (N/A) LIMA-LAD RIMA-PL LEFT RADIAL -PD SVG-OM SVG-RAMUS   SURGEON:  Surgeon(s) and Role:    * Cornesha Radziewicz, Merri Brunette, MD - Primary  PHYSICIAN ASSISTANT: WAYNE GOLD PA-C  ANESTHESIA:   general  EBL: as per anes record  BLOOD ADMINISTERED:none  DRAINS: BILAT PLEURAL AND MEDIASTINAL CHEST DRAINS   LOCAL MEDICATIONS USED:  OTHER EXPAREL  SPECIMEN:  No Specimen  DISPOSITION OF SPECIMEN:  N/A  COUNTS:  YES  TOURNIQUET:  * No tourniquets in log *  DICTATION: .Dragon Dictation  PLAN OF CARE: Admit to inpatient   PATIENT DISPOSITION:  ICU - intubated and hemodynamically stable.   Delay start of Pharmacological VTE agent (>24hrs) due to surgical blood loss or risk of bleeding: yes  COMPLICATIONS: NO KNOWN

## 2019-08-24 NOTE — Anesthesia Procedure Notes (Signed)
Central Venous Catheter Insertion Performed by: Achille Rich, MD, anesthesiologist Start/End7/20/2021 6:41 AM, 08/24/2019 6:52 AM Patient location: Pre-op. Preanesthetic checklist: patient identified, IV checked, site marked, risks and benefits discussed, surgical consent, monitors and equipment checked, pre-op evaluation, timeout performed and anesthesia consent Position: Trendelenburg Lidocaine 1% used for infiltration and patient sedated Hand hygiene performed , maximum sterile barriers used  and Seldinger technique used Catheter size: 9 Fr Central line and PA cath was placed.Sheath introducer Swan type:thermodilation Procedure performed using ultrasound guided technique. Ultrasound Notes:anatomy identified, needle tip was noted to be adjacent to the nerve/plexus identified, no ultrasound evidence of intravascular and/or intraneural injection and image(s) printed for medical record Attempts: 1 Following insertion, line sutured, dressing applied and Biopatch. Post procedure assessment: blood return through all ports, free fluid flow and no air  Patient tolerated the procedure well with no immediate complications.

## 2019-08-25 ENCOUNTER — Inpatient Hospital Stay (HOSPITAL_COMMUNITY): Payer: BC Managed Care – PPO

## 2019-08-25 ENCOUNTER — Encounter (HOSPITAL_COMMUNITY): Payer: Self-pay | Admitting: Cardiothoracic Surgery

## 2019-08-25 LAB — CBC
HCT: 31.5 % — ABNORMAL LOW (ref 39.0–52.0)
HCT: 29.5 % — ABNORMAL LOW (ref 39.0–52.0)
Hemoglobin: 10.3 g/dL — ABNORMAL LOW (ref 13.0–17.0)
Hemoglobin: 9.2 g/dL — ABNORMAL LOW (ref 13.0–17.0)
MCH: 30.4 pg (ref 26.0–34.0)
MCH: 29.6 pg (ref 26.0–34.0)
MCHC: 32.7 g/dL (ref 30.0–36.0)
MCHC: 31.2 g/dL (ref 30.0–36.0)
MCV: 92.9 fL (ref 80.0–100.0)
MCV: 94.9 fL (ref 80.0–100.0)
Platelets: 223 10*3/uL (ref 150–400)
Platelets: 188 10*3/uL (ref 150–400)
RBC: 3.39 MIL/uL — ABNORMAL LOW (ref 4.22–5.81)
RBC: 3.11 MIL/uL — ABNORMAL LOW (ref 4.22–5.81)
RDW: 13.8 % (ref 11.5–15.5)
RDW: 14.1 % (ref 11.5–15.5)
WBC: 14.3 10*3/uL — ABNORMAL HIGH (ref 4.0–10.5)
WBC: 14.1 10*3/uL — ABNORMAL HIGH (ref 4.0–10.5)
nRBC: 0 % (ref 0.0–0.2)
nRBC: 0 % (ref 0.0–0.2)

## 2019-08-25 LAB — BASIC METABOLIC PANEL
Anion gap: 8 (ref 5–15)
Anion gap: 8 (ref 5–15)
BUN: 16 mg/dL (ref 8–23)
BUN: 21 mg/dL (ref 8–23)
CO2: 23 mmol/L (ref 22–32)
CO2: 22 mmol/L (ref 22–32)
Calcium: 8.6 mg/dL — ABNORMAL LOW (ref 8.9–10.3)
Calcium: 8.3 mg/dL — ABNORMAL LOW (ref 8.9–10.3)
Chloride: 108 mmol/L (ref 98–111)
Chloride: 108 mmol/L (ref 98–111)
Creatinine, Ser: 1.35 mg/dL — ABNORMAL HIGH (ref 0.61–1.24)
Creatinine, Ser: 1.55 mg/dL — ABNORMAL HIGH (ref 0.61–1.24)
GFR calc Af Amer: >60 mL/min (ref >60)
GFR calc Af Amer: 54 mL/min — ABNORMAL LOW (ref >60)
GFR calc non Af Amer: 55 mL/min — ABNORMAL LOW (ref >60)
GFR calc non Af Amer: 47 mL/min — ABNORMAL LOW (ref >60)
Glucose, Bld: 169 mg/dL — ABNORMAL HIGH (ref 70–99)
Glucose, Bld: 135 mg/dL — ABNORMAL HIGH (ref 70–99)
Potassium: 4.7 mmol/L (ref 3.5–5.1)
Potassium: 5 mmol/L (ref 3.5–5.1)
Sodium: 139 mmol/L (ref 135–145)
Sodium: 138 mmol/L (ref 135–145)

## 2019-08-25 LAB — GLUCOSE, CAPILLARY
Glucose-Capillary: 151 mg/dL — ABNORMAL HIGH (ref 70–99)
Glucose-Capillary: 154 mg/dL — ABNORMAL HIGH (ref 70–99)
Glucose-Capillary: 162 mg/dL — ABNORMAL HIGH (ref 70–99)
Glucose-Capillary: 161 mg/dL — ABNORMAL HIGH (ref 70–99)
Glucose-Capillary: 120 mg/dL — ABNORMAL HIGH (ref 70–99)
Glucose-Capillary: 140 mg/dL — ABNORMAL HIGH (ref 70–99)
Glucose-Capillary: 95 mg/dL (ref 70–99)
Glucose-Capillary: 114 mg/dL — ABNORMAL HIGH (ref 70–99)
Glucose-Capillary: 116 mg/dL — ABNORMAL HIGH (ref 70–99)
Glucose-Capillary: 73 mg/dL (ref 70–99)
Glucose-Capillary: 123 mg/dL — ABNORMAL HIGH (ref 70–99)
Glucose-Capillary: 157 mg/dL — ABNORMAL HIGH (ref 70–99)

## 2019-08-25 LAB — MAGNESIUM
Magnesium: 2.3 mg/dL (ref 1.7–2.4)
Magnesium: 2.1 mg/dL (ref 1.7–2.4)

## 2019-08-25 MED ORDER — INSULIN ASPART 100 UNIT/ML ~~LOC~~ SOLN
0.0000 [IU] | SUBCUTANEOUS | Status: DC
  Administered 2019-08-25 (×2): 2 [IU] via SUBCUTANEOUS
  Administered 2019-08-26 – 2019-08-27 (×2): 4 [IU] via SUBCUTANEOUS
  Administered 2019-08-27: 2 [IU] via SUBCUTANEOUS
  Administered 2019-08-28: 4 [IU] via SUBCUTANEOUS
  Administered 2019-08-28: 11 [IU] via SUBCUTANEOUS

## 2019-08-25 MED ORDER — KETOROLAC TROMETHAMINE 15 MG/ML IJ SOLN
7.5000 mg | Freq: Three times a day (TID) | INTRAMUSCULAR | Status: DC | PRN
  Administered 2019-08-26: 7.5 mg via INTRAVENOUS
  Filled 2019-08-25: qty 1

## 2019-08-25 MED ORDER — INSULIN DETEMIR 100 UNIT/ML ~~LOC~~ SOLN: 10.0000 [IU] | Freq: Every day | SUBCUTANEOUS | Status: DC

## 2019-08-25 MED ORDER — ENOXAPARIN SODIUM 40 MG/0.4ML ~~LOC~~ SOLN
40.0000 mg | Freq: Every day | SUBCUTANEOUS | Status: DC
  Administered 2019-08-25 – 2019-08-29 (×5): 40 mg via SUBCUTANEOUS
  Filled 2019-08-25 (×5): qty 0.4

## 2019-08-25 MED ORDER — THIAMINE HCL 100 MG/ML IJ SOLN
INTRAVENOUS | Status: AC
  Filled 2019-08-25 (×2): qty 1000

## 2019-08-25 MED ORDER — INSULIN DETEMIR 100 UNIT/ML ~~LOC~~ SOLN
10.0000 [IU] | Freq: Every day | SUBCUTANEOUS | Status: DC
  Administered 2019-08-25 – 2019-08-30 (×6): 10 [IU] via SUBCUTANEOUS
  Filled 2019-08-25 (×6): qty 0.1

## 2019-08-25 MED ORDER — CLOPIDOGREL BISULFATE 75 MG PO TABS
75.0000 mg | ORAL_TABLET | Freq: Every day | ORAL | Status: DC
  Administered 2019-08-25 – 2019-08-30 (×6): 75 mg via ORAL
  Filled 2019-08-25 (×6): qty 1

## 2019-08-25 MED ORDER — COLCHICINE 0.3 MG HALF TABLET
0.3000 mg | ORAL_TABLET | Freq: Two times a day (BID) | ORAL | Status: DC
  Administered 2019-08-25 (×2): 0.3 mg via ORAL
  Filled 2019-08-25 (×3): qty 1

## 2019-08-25 MED ORDER — ASPIRIN EC 81 MG PO TBEC
81.0000 mg | DELAYED_RELEASE_TABLET | Freq: Every day | ORAL | Status: DC
  Administered 2019-08-25 – 2019-08-30 (×6): 81 mg via ORAL
  Filled 2019-08-25 (×6): qty 1

## 2019-08-25 MED ORDER — ISOSORBIDE DINITRATE 10 MG PO TABS
10.0000 mg | ORAL_TABLET | Freq: Three times a day (TID) | ORAL | Status: DC
  Administered 2019-08-25 – 2019-08-30 (×14): 10 mg via ORAL
  Filled 2019-08-25 (×16): qty 1

## 2019-08-25 NOTE — Addendum Note (Signed)
Addendum  created 08/25/19 1324 by Adair Laundry, CRNA   Order list changed

## 2019-08-25 NOTE — Progress Notes (Signed)
      [address]       [place] [address]             [phone]      POD # 1 CABG x 5  Resting comfortably  BP (!) 104/22   Pulse 86   Temp 97.8 F (36.6 C) (Oral)   Resp (!) 21   Ht 5\' 4"  (1.626 m)   Wt 64.4 kg   SpO2 98%   BMI 24.37 kg/m     Intake/Output Summary (Last 24 hours) at 08/25/2019 1731 Last data filed at 08/25/2019 1400 Gross per 24 hour  Intake 1221.26 ml  Output 755 ml  Net 466.26 ml    CBG well controlled  PM labs pending  Doing well POD # 1  Ricki C. 08/27/2019, MD [facility] [phone]

## 2019-08-25 NOTE — Progress Notes (Addendum)
[facility] DAILY ICU PROGRESS NOTE                   [address]            [place] [address]          [phone]   1 Day Post-Op Procedure(s) (LRB): CORONARY ARTERY BYPASS GRAFTING (CABG), ON PUMP, TIMES FIVE, USING BILATERAL INTERNAL MAMMARY ARTERIES, ENDOSCOPICALLY HARVESTED RIGHT GREATER SAPHENOUS VEIN, AND OPEN LEFT RADIAL ARTERY HARVESTING (N/A) RADIAL ARTERY HARVEST (Left) TRANSESOPHAGEAL ECHOCARDIOGRAM (TEE) (N/A) INDOCYANINE GREEN FLUORESCENCE IMAGING (ICG) (N/A)  Total Length of Stay:  LOS: 5 days   Subjective: Feels ok, minor pain  Objective: Vital signs in last 24 hours: Temp:  [95.9 F (35.5 C)-100.8 F (38.2 C)] 99.3 F (37.4 C) (07/21 0700) Pulse Rate:  [80-92] 80 (07/21 0700) Cardiac Rhythm: Atrial paced (07/20 2000) Resp:  [0-22] 0 (07/21 0700) BP: (94-155)/(23-141) 109/34 (07/21 0700) SpO2:  [98 %-100 %] 100 % (07/21 0700) Arterial Line BP: (98-150)/(48-86) 104/50 (07/21 0700) FiO2 (%):  [40 %-50 %] 40 % (07/20 1715) Weight:  [64.4 kg] 64.4 kg (07/21 0500)  Filed Weights   08/23/19 0613 08/24/19 0334 08/25/19 0500  Weight: 63 kg 62.1 kg 64.4 kg    Weight change: 2.303 kg   Hemodynamic parameters for last 24 hours: PAP: (19-33)/(8-18) 19/8 CO:  [2.2 L/min-4.3 L/min] 4.3 L/min CI:  [1.3 L/min/m2-2.6 L/min/m2] 2.6 L/min/m2  Intake/Output from previous day: 07/20 0701 - 07/21 0700 In: 4140.7 [I.V.:2293.7; Blood:415; IV Piggyback:1432] Out: 4975 [Urine:3855; Emesis/NG output:50; Blood:830; Chest Tube:240]  Intake/Output this shift: No intake/output data recorded.  Current Meds: Scheduled Meds: . acetaminophen  1,000 mg Oral Q6H   Or  . acetaminophen (TYLENOL) oral liquid 160 mg/5 mL  1,000 mg Per Tube Q6H  . aspirin EC  325 mg Oral Daily   Or  . aspirin  324 mg Per Tube Daily  . atorvastatin  80 mg Oral Daily  . bisacodyl  10 mg Oral Daily   Or  . bisacodyl  10 mg Rectal Daily  . Chlorhexidine Gluconate Cloth  6 each Topical  Daily  . desvenlafaxine  100 mg Oral BH-q7a  . docusate sodium  200 mg Oral Daily  . gabapentin  900 mg Oral BH-q7a  . mouth rinse  15 mL Mouth Rinse BID  . metoprolol tartrate  12.5 mg Oral BID   Or  . metoprolol tartrate  12.5 mg Per Tube BID  . [START ON 08/26/2019] pantoprazole  40 mg Oral Daily  . sodium chloride flush  10-40 mL Intracatheter Q12H  . sodium chloride flush  3 mL Intravenous Q12H   Continuous Infusions: . sodium chloride Stopped (08/25/19 0459)  . sodium chloride Stopped (08/25/19 0513)  . sodium chloride 20 mL/hr at 08/24/19 2000  . albumin human    . cefUROXime (ZINACEF)  IV 200 mL/hr at 08/25/19 0500  . dexmedetomidine (PRECEDEX) IV infusion Stopped (08/24/19 1604)  . insulin 1.8 mL/hr at 08/25/19 0500  . lactated ringers    . lactated ringers    . lactated ringers 20 mL/hr at 08/25/19 0500  . niCARDipine Stopped (08/24/19 1609)  . nitroGLYCERIN 5 mcg/min (08/25/19 0500)   PRN Meds:.sodium chloride, albumin human, dextrose, lactated ringers, metoprolol tartrate, midazolam, morphine injection, ondansetron (ZOFRAN) IV, oxyCODONE, sodium chloride flush, sodium chloride flush, traMADol  General appearance: alert, cooperative and no distress Heart: regular rate and rhythm Lungs: clear anteriorly, min dm in bases Abdomen: benign exam Extremities:  some edema left hand Wound: evh site ok, dressings in place for other incisions, left hand- fingers cool but motor /sensory/ cap refill intact  Loud rub with chest tubes in palce Lab Results: CBC: Recent Labs    08/24/19 1944 08/25/19 0405  WBC 13.8* 14.3*  HGB 10.4* 10.3*  HCT 32.0* 31.5*  PLT 190 223   BMET:  Recent Labs    08/24/19 1944 08/25/19 0405  NA 140 139  K 5.6* 4.7  CL 109 108  CO2 22 23  GLUCOSE 166* 169*  BUN 14 16  CREATININE 1.21 1.35*  CALCIUM 8.2* 8.6*    CMET: Lab Results  Component Value Date   WBC 14.3 (H) 08/25/2019   HGB 10.3 (L) 08/25/2019   HCT 31.5 (L) 08/25/2019    PLT 223 08/25/2019   GLUCOSE 169 (H) 08/25/2019   CHOL 86 08/21/2019   TRIG 40 08/21/2019   HDL 30 (L) 08/21/2019   LDLCALC 48 08/21/2019   ALT 9 08/21/2019   AST 13 (L) 08/21/2019   NA 139 08/25/2019   K 4.7 08/25/2019   CL 108 08/25/2019   CREATININE 1.35 (H) 08/25/2019   BUN 16 08/25/2019   CO2 23 08/25/2019   INR 1.6 (H) 08/24/2019   HGBA1C 9.7 (H) 08/24/2019      PT/INR:  Recent Labs    08/24/19 1430  LABPROT 18.6*  INR 1.6*   Radiology: [facility] Chest Port 1 View  Result Date: 08/24/2019 CLINICAL DATA:  Status post cardiac surgery EXAM: PORTABLE CHEST 1 VIEW COMPARISON:  08/21/2019 FINDINGS: Cardiac shadow is stable. Postsurgical changes are now seen. Endotracheal tube is noted in satisfactory position. Gastric catheter extends into the stomach. Swan-Ganz catheter is noted in the right pulmonary artery. Bilateral thoracostomy catheters are noted without pneumothorax. Mediastinal drain is seen. No focal infiltrate or sizable effusion is noted. IMPRESSION: Postsurgical change with tubes and lines as described above. No focal infiltrate or pneumothorax is noted. Electronically Signed   By: Alcide Clever M.D.   On: 08/24/2019 15:04   ECHO INTRAOPERATIVE TEE  Result Date: 08/24/2019  *INTRAOPERATIVE TRANSESOPHAGEAL REPORT *  Patient Name:   Jonathan Holder   Date of Exam: 08/24/2019 Medical Rec #:  [id number]      Height:       64.0 in Accession #:    [id number]     Weight:       136.9 lb Date of Birth:  1955/12/15       BSA:          1.67 m Patient Age:    64 years       BP:           119/50 mmHg Patient Gender: M              HR:           52 bpm. Exam Location:  Anesthesiology Transesophogeal exam was perform intraoperatively during surgical procedure. Patient was closely monitored under general anesthesia during the entirety of examination. Indications:     CABG. CAD Sonographer:     Celene Skeen RDCS (AE) Performing Phys: [id number] Gwenlyn Hottinger Z Corbin Falck Complications: No known complications  during this procedure. POST-OP IMPRESSIONS Overall, there were no significant changes from pre-bypass. PRE-OP FINDINGS  Left Ventricle: The left ventricle has mild-moderately reduced systolic function, with an ejection fraction of 40-45%. The cavity size was moderately dilated. The mid papillary region towards the base of the heart has fairly normal function. There is akinesis of the  apex of the heart. There is no increase in left ventricular wall thickness. Right Ventricle: The right ventricle has normal systolic function. The cavity was normal. There is no increase in right ventricular wall thickness. Left Atrium: Left atrial size was not assessed. The left atrial appendage is well visualized and there is no evidence of thrombus present. Right Atrium: Right atrial size is dilated. Interatrial Septum: No atrial level shunt detected by color flow Doppler. Pericardium: A small pericardial effusion is present. The pericardial effusion is circumferential. There is no evidence of cardiac tamponade. Mitral Valve: The mitral valve is normal in structure. No thickening of the mitral valve leaflet. No calcification of the mitral valve leaflet. Mitral valve regurgitation is trivial by color flow Doppler. Tricuspid Valve: The tricuspid valve was normal in structure. Tricuspid valve regurgitation is trivial by color flow Doppler. Aortic Valve: The aortic valve is normal in structure. Aortic valve regurgitation was not visualized by color flow Doppler. There is no evidence of aortic valve stenosis. Pulmonic Valve: The pulmonic valve was normal in structure. Pulmonic valve regurgitation is not visualized by color flow Doppler. Compared to previous exam:The pericardial effusion is no longer present. +--------------+--------++ LEFT VENTRICLE         +--------------+--------++ PLAX 2D                +--------------+--------++ LVIDd:        4.93 cm  +--------------+--------++ LVIDs:        3.53 cm   +--------------+--------++ LVOT diam:    2.05 cm  +--------------+--------++ LV SV:        63 ml    +--------------+--------++ LV SV Index:  37.16    +--------------+--------++ LVOT Area:    3.30 cm +--------------+--------++                        +--------------+--------++  +------------------+---------++ LV Volumes (MOD)            +------------------+---------++ LV area d, A4C:   21.80 cm +------------------+---------++ LV area s, A4C:   14.90 cm +------------------+---------++ LV major d, A4C:  6.75 cm   +------------------+---------++ LV major s, A4C:  6.31 cm   +------------------+---------++ LV vol d, MOD A4C:55.1 ml   +------------------+---------++ LV vol s, MOD A4C:28.8 ml   +------------------+---------++ LV SV MOD A4C:    55.1 ml   +------------------+---------++ +------------------+-----------++ AORTIC VALVE                  +------------------+-----------++ AV Area (Vmax):   2.02 cm    +------------------+-----------++ AV Area (Vmean):  2.05 cm    +------------------+-----------++ AV Area (VTI):    2.37 cm    +------------------+-----------++ AV Vmax:          138.00 cm/s +------------------+-----------++ AV Vmean:         93.000 cm/s +------------------+-----------++ AV VTI:           0.273 m     +------------------+-----------++ AV Peak Grad:     7.6 mmHg    +------------------+-----------++ AV Mean Grad:     4.0 mmHg    +------------------+-----------++ LVOT Vmax:        84.40 cm/s  +------------------+-----------++ LVOT Vmean:       57.700 cm/s +------------------+-----------++ LVOT VTI:         0.196 m     +------------------+-----------++ LVOT/AV VTI ratio:0.72        +------------------+-----------++  +--------------+-------+ SHUNTS                +--------------+-------+  Systemic VTI: 0.20 m  +--------------+-------+ Systemic Diam:2.05 cm +--------------+-------+  Gaynelle Adu MD Electronically signed by Gaynelle Adu MD Signature Date/Time: 08/24/2019/2:41:49 PM    Final      Assessment/Plan: S/P Procedure(s) (LRB): CORONARY ARTERY BYPASS GRAFTING (CABG), ON PUMP, TIMES FIVE, USING BILATERAL INTERNAL MAMMARY ARTERIES, ENDOSCOPICALLY HARVESTED RIGHT GREATER SAPHENOUS VEIN, AND OPEN LEFT RADIAL ARTERY HARVESTING (N/A) RADIAL ARTERY HARVEST (Left) TRANSESOPHAGEAL ECHOCARDIOGRAM (TEE) (N/A) INDOCYANINE GREEN FLUORESCENCE IMAGING (ICG) (N/A)  1 doing well off inotropes, hemodyn stable in sinus rhythm. SBP in low 100's 2 tmax 100.8- monitor clinically , WBC 14.3- probable sytemic inflammatory response 3 sats good on 4 liters, CXR no signif effus or consolidation 4 minor acute blood loss expected anemia 5 minor bump in creat. Some volume overload but excellent spontaneous diuresis, will monitor and may need some diuretics 6 minor CT drainage- hopefully can d/c CT's today 7 d/c aline  And swan 8 see progression orders 9 poss transfer later today 10 routine cardiac rehab  Rowe Clack PA-C Pager [phone] 08/25/2019 7:17 AM   Pt seen and examined; agree with documentation and plan. Breeanna Galgano Z. Vickey Sages, MD [phone]

## 2019-08-26 ENCOUNTER — Inpatient Hospital Stay (HOSPITAL_COMMUNITY): Payer: BC Managed Care – PPO

## 2019-08-26 LAB — BASIC METABOLIC PANEL
Anion gap: 6 (ref 5–15)
Anion gap: 8 (ref 5–15)
BUN: 23 mg/dL (ref 8–23)
BUN: 32 mg/dL — ABNORMAL HIGH (ref 8–23)
CO2: 25 mmol/L (ref 22–32)
CO2: 23 mmol/L (ref 22–32)
Calcium: 8.3 mg/dL — ABNORMAL LOW (ref 8.9–10.3)
Calcium: 8.3 mg/dL — ABNORMAL LOW (ref 8.9–10.3)
Chloride: 106 mmol/L (ref 98–111)
Chloride: 106 mmol/L (ref 98–111)
Creatinine, Ser: 1.64 mg/dL — ABNORMAL HIGH (ref 0.61–1.24)
Creatinine, Ser: 1.51 mg/dL — ABNORMAL HIGH (ref 0.61–1.24)
GFR calc Af Amer: 51 mL/min — ABNORMAL LOW (ref >60)
GFR calc Af Amer: 56 mL/min — ABNORMAL LOW (ref >60)
GFR calc non Af Amer: 44 mL/min — ABNORMAL LOW (ref >60)
GFR calc non Af Amer: 48 mL/min — ABNORMAL LOW (ref >60)
Glucose, Bld: 60 mg/dL — ABNORMAL LOW (ref 70–99)
Glucose, Bld: 124 mg/dL — ABNORMAL HIGH (ref 70–99)
Potassium: 4.3 mmol/L (ref 3.5–5.1)
Potassium: 4.3 mmol/L (ref 3.5–5.1)
Sodium: 137 mmol/L (ref 135–145)
Sodium: 137 mmol/L (ref 135–145)

## 2019-08-26 LAB — CBC
HCT: 27.3 % — ABNORMAL LOW (ref 39.0–52.0)
HCT: 26.4 % — ABNORMAL LOW (ref 39.0–52.0)
Hemoglobin: 8.8 g/dL — ABNORMAL LOW (ref 13.0–17.0)
Hemoglobin: 8.3 g/dL — ABNORMAL LOW (ref 13.0–17.0)
MCH: 30.4 pg (ref 26.0–34.0)
MCH: 30 pg (ref 26.0–34.0)
MCHC: 32.2 g/dL (ref 30.0–36.0)
MCHC: 31.4 g/dL (ref 30.0–36.0)
MCV: 94.5 fL (ref 80.0–100.0)
MCV: 95.3 fL (ref 80.0–100.0)
Platelets: 166 10*3/uL (ref 150–400)
Platelets: 154 10*3/uL (ref 150–400)
RBC: 2.89 MIL/uL — ABNORMAL LOW (ref 4.22–5.81)
RBC: 2.77 MIL/uL — ABNORMAL LOW (ref 4.22–5.81)
RDW: 14.3 % (ref 11.5–15.5)
RDW: 14.2 % (ref 11.5–15.5)
WBC: 11.6 10*3/uL — ABNORMAL HIGH (ref 4.0–10.5)
WBC: 11.2 10*3/uL — ABNORMAL HIGH (ref 4.0–10.5)
nRBC: 0 % (ref 0.0–0.2)
nRBC: 0 % (ref 0.0–0.2)

## 2019-08-26 LAB — GLUCOSE, CAPILLARY
Glucose-Capillary: 104 mg/dL — ABNORMAL HIGH (ref 70–99)
Glucose-Capillary: 106 mg/dL — ABNORMAL HIGH (ref 70–99)
Glucose-Capillary: 113 mg/dL — ABNORMAL HIGH (ref 70–99)
Glucose-Capillary: 121 mg/dL — ABNORMAL HIGH (ref 70–99)
Glucose-Capillary: 161 mg/dL — ABNORMAL HIGH (ref 70–99)
Glucose-Capillary: 47 mg/dL — ABNORMAL LOW (ref 70–99)
Glucose-Capillary: 57 mg/dL — ABNORMAL LOW (ref 70–99)
Glucose-Capillary: 61 mg/dL — ABNORMAL LOW (ref 70–99)
Glucose-Capillary: 98 mg/dL (ref 70–99)

## 2019-08-26 MED ORDER — METOPROLOL TARTRATE 25 MG PO TABS
25.0000 mg | ORAL_TABLET | Freq: Two times a day (BID) | ORAL | Status: DC
  Administered 2019-08-27: 25 mg via ORAL
  Filled 2019-08-26: qty 1

## 2019-08-26 MED ORDER — METOPROLOL TARTRATE 25 MG/10 ML ORAL SUSPENSION: 25.0000 mg | Freq: Two times a day (BID) | ORAL | Status: DC

## 2019-08-26 MED ORDER — FUROSEMIDE 10 MG/ML IJ SOLN
40.0000 mg | Freq: Once | INTRAMUSCULAR | Status: AC
  Administered 2019-08-26: 40 mg via INTRAVENOUS
  Filled 2019-08-26: qty 4

## 2019-08-26 MED ORDER — FUROSEMIDE 10 MG/ML IJ SOLN
40.0000 mg | Freq: Two times a day (BID) | INTRAMUSCULAR | Status: DC
  Administered 2019-08-27 (×2): 40 mg via INTRAVENOUS
  Filled 2019-08-26 (×3): qty 4

## 2019-08-26 MED ORDER — METOCLOPRAMIDE HCL 5 MG/ML IJ SOLN
5.0000 mg | Freq: Four times a day (QID) | INTRAMUSCULAR | Status: AC
  Administered 2019-08-26 (×3): 5 mg via INTRAVENOUS
  Filled 2019-08-26 (×3): qty 2

## 2019-08-26 MED ORDER — COLCHICINE 0.6 MG PO TABS
0.6000 mg | ORAL_TABLET | Freq: Two times a day (BID) | ORAL | Status: DC
  Administered 2019-08-26 – 2019-08-30 (×9): 0.6 mg via ORAL
  Filled 2019-08-26 (×10): qty 1

## 2019-08-26 MED FILL — Potassium Chloride Inj 2 mEq/ML: INTRAVENOUS | Qty: 40 | Status: AC

## 2019-08-26 MED FILL — Heparin Sodium (Porcine) Inj 1000 Unit/ML: INTRAMUSCULAR | Qty: 30 | Status: AC

## 2019-08-26 MED FILL — Sodium Chloride IV Soln 0.9%: INTRAVENOUS | Qty: 2000 | Status: AC

## 2019-08-26 MED FILL — Electrolyte-R (PH 7.4) Solution: INTRAVENOUS | Qty: 4000 | Status: AC

## 2019-08-26 MED FILL — Sodium Bicarbonate IV Soln 8.4%: INTRAVENOUS | Qty: 50 | Status: AC

## 2019-08-26 MED FILL — Mannitol IV Soln 20%: INTRAVENOUS | Qty: 500 | Status: AC

## 2019-08-26 MED FILL — Heparin Sodium (Porcine) Inj 1000 Unit/ML: INTRAMUSCULAR | Qty: 10 | Status: AC

## 2019-08-26 MED FILL — Magnesium Sulfate Inj 50%: INTRAMUSCULAR | Qty: 10 | Status: AC

## 2019-08-26 NOTE — Discharge Instructions (Signed)
Your A1c average was 9.7% on 7/16. This needs to be lower at 7% or less. Please work with your PCP and follow up regarding this level. Levels so high can contribute to decrease blood flow to you heart and place you at greater risk of heart attacks and strokes.  Endoscopic Saphenous Vein Harvesting, Care After This sheet gives you information about how to care for yourself after your procedure. Your health care provider may also give you more specific instructions. If you have problems or questions, contact your health care provider. What can I expect after the procedure? After the procedure, it is common to have:  Pain.  Bruising.  Swelling.  Numbness. Follow these instructions at home: Incision care   Follow instructions from your health care provider about how to take care of your incisions. Make sure you: ? Wash your hands with soap and water before and after you change your bandages (dressings). If soap and water are not available, use hand sanitizer. ? Change your dressings as told by your health care provider. ? Leave stitches (sutures), skin glue, or adhesive strips in place. These skin closures may need to stay in place for 2 weeks or longer. If adhesive strip edges start to loosen and curl up, you may trim the loose edges. Do not remove adhesive strips completely unless your health care provider tells you to do that.  Check your incision areas every day for signs of infection. Check for: ? More redness, swelling, or pain. ? Fluid or blood. ? Warmth. ? Pus or a bad smell. Medicines  Take over-the-counter and prescription medicines only as told by your health care provider.  Ask your health care provider if the medicine prescribed to you requires you to avoid driving or using heavy machinery. General instructions  Raise (elevate) your legs above the level of your heart while you are sitting or lying down.  Avoid crossing your legs.  Avoid sitting for long periods of  time. Change positions every 30 minutes.  Do any exercises your health care providers have given you. These may include deep breathing, coughing, and walking exercises.  Do not take baths, swim, or use a hot tub until your health care provider approves. Ask your health care provider if you may take showers. You may only be allowed to take sponge baths.  Wear compression stockings as told by your health care provider. These stockings help to prevent blood clots and reduce swelling in your legs.  Keep all follow-up visits as told by your health care provider. This is important. Contact a health care provider if:  Medicine does not help your pain.  Your pain gets worse.  You have new leg bruises or your leg bruises get bigger.  Your leg feels numb.  You have more redness, swelling, or pain around your incision.  You have fluid or blood coming from your incision.  Your incision feels warm to the touch.  You have pus or a bad smell coming from your incision.  You have a fever. Get help right away if:  Your pain is severe.  You develop pain, tenderness, warmth, redness, or swelling in any part of your leg.  You have chest pain.  You have trouble breathing. Summary  Raise (elevate) your legs above the level of your heart while you are sitting or lying down.  Wear compression stockings as told by your health care provider.  Make sure you know which symptoms should prompt you to contact your health care provider.  Keep all follow-up visits as told by your health care provider. This information is not intended to replace advice given to you by your health care provider. Make sure you discuss any questions you have with your health care provider. Document Revised: 12/29/2017 Document Reviewed: 12/29/2017 [employer] Patient Education  2020 [employer] Coronary Artery Bypass Grafting, Care After This sheet gives you information about how to care for yourself after your  procedure. Your doctor may also give you more specific instructions. If you have problems or questions, call your doctor. What can I expect after the procedure? After the procedure, it is common to:  Feel sick to your stomach (nauseous).  Not want to eat as much as normal (lack of appetite).  Have trouble pooping (constipation).  Have weakness and tiredness (fatigue).  Feel sad (depressed) or grouchy (irritable).  Have pain or discomfort around the cuts from surgery (incisions). Follow these instructions at home: Medicines  Take over-the-counter and prescription medicines only as told by your doctor. Do not stop taking medicines or start any new medicines unless your doctor says it is okay.  If you were prescribed an antibiotic medicine, take it as told by your doctor. Do not stop taking the antibiotic even if you start to feel better. Incision care   Follow instructions from your doctor about how to take care of your cuts from surgery. Make sure you: ? Wash your hands with soap and water before and after you change your bandage (dressing). If you cannot use soap and water, use hand sanitizer. ? Change your bandage as told by your doctor. ? Leave stitches (sutures), skin glue, or skin tape (adhesive) strips in place. They may need to stay in place for 2 weeks or longer. If tape strips get loose and curl up, you may trim the loose edges. Do not remove tape strips completely unless your doctor says it is okay.  Make sure the surgery cuts are clean, dry, and protected.  Check your cut areas every day for signs of infection. Check for: ? More redness, swelling, or pain. ? More fluid or blood. ? Warmth. ? Pus or a bad smell.  If cuts were made in your legs: ? Avoid crossing your legs. ? Avoid sitting for long periods of time. Change positions every 30 minutes. ? Raise (elevate) your legs when you are sitting. Bathing  Do not take baths, swim, or use a hot tub until your doctor  says it is okay.  You may shower . Pat the surgery cuts dry. Do not rub the cuts to dry. Eating and drinking   Eat foods that are high in fiber, such as beans, nuts, whole grains, and raw fruits and vegetables. Any meats you eat should be lean cut. Avoid canned, processed, and fried foods. This can help prevent trouble pooping. This is also a part of a heart-healthy diet.  Drink enough fluid to keep your pee (urine) pale yellow.  Do not drink alcohol until you are fully recovered. Ask your doctor when it is safe to drink alcohol. Activity  Rest and limit your activity as told by your doctor. You may be told to: ? Stop any activity right away if you have chest pain, shortness of breath, irregular heartbeats, or dizziness. Get help right away if you have any of these symptoms. ? Move around often for short periods or take short walks as told by your doctor. Slowly increase your activities. ? Avoid lifting, pushing, or pulling anything that is heavier  than 10 lb (4.5 kg) for at least 6 weeks or as told by your doctor.  Do physical therapy or a cardiac rehab (cardiac rehabilitation) program as told by your doctor. ? Physical therapy involves doing exercises to maintain movement and build strength and endurance. ? A cardiac rehab program includes:  Exercise training.  Education.  Counseling.  Do not drive until your doctor says it is okay.  Ask your doctor when you can go back to work.  Ask your doctor when you can be sexually active. General instructions  Do not drive or use heavy machinery while taking prescription pain medicine.  Do not use any products that contain nicotine or tobacco. These include cigarettes, e-cigarettes, and chewing tobacco. If you need help quitting, ask your doctor.  Take 2-3 deep breaths every few hours during the day while you get better. This helps expand your lungs and prevent problems.  If you were given a device called an incentive spirometer, use  it several times a day to practice deep breathing. Support your chest with a pillow or your arms when you take deep breaths or cough.  Wear compression stockings as told by your doctor.  Weigh yourself every day. This helps to see if your body is holding (retaining) fluid that may make your heart and lungs work harder.  Keep all follow-up visits as told by your doctor. This is important. Contact a doctor if:  You have more redness, swelling, or pain around any cut.  You have more fluid or blood coming from any cut.  Any cut feels warm to the touch.  You have pus or a bad smell coming from any cut.  You have a fever.  You have swelling in your ankles or legs.  You have pain in your legs.  You gain 2 lb (0.9 kg) or more a day.  You feel sick to your stomach or you throw up (vomit).  You have watery poop (diarrhea). Get help right away if:  You have chest pain that goes to your jaw or arms.  You are short of breath.  You have a fast or irregular heartbeat.  You notice a "clicking" in your breastbone (sternum) when you move.  You have any signs of a stroke. "BE FAST" is an easy way to remember the main warning signs: ? B - Balance. Signs are dizziness, sudden trouble walking, or loss of balance. ? E - Eyes. Signs are trouble seeing or a change in how you see. ? F - Face. Signs are sudden weakness or loss of feeling of the face, or the face or eyelid drooping on one side. ? A - Arms. Signs are weakness or loss of feeling in an arm. This happens suddenly and usually on one side of the body. ? S - Speech. Signs are sudden trouble speaking, slurred speech, or trouble understanding what people say. ? T - Time. Time to call emergency services. Write down what time symptoms started.  You have other signs of a stroke, such as: ? A sudden, very bad headache with no known cause. ? Feeling sick to your stomach. ? Throwing up. ? Jerky movements you cannot control (seizure). These  symptoms may be an emergency. Do not wait to see if the symptoms will go away. Get medical help right away. Call your local emergency services (911 in the [place] Do not drive yourself to the hospital. Summary  After the procedure, it is common to have pain or discomfort in the cuts  from surgery (incisions).  Do not take baths, swim, or use a hot tub until your doctor says it is okay.  Slowly increase your activities. You may need physical therapy or cardiac rehab.  Weigh yourself every day. This helps to see if your body is holding fluid. This information is not intended to replace advice given to you by your health care provider. Make sure you discuss any questions you have with your health care provider. Document Revised: 09/30/2017 Document Reviewed: 09/30/2017 Elsevier Patient Education  2020 [employer].

## 2019-08-26 NOTE — Discharge Summary (Signed)
Physician Discharge Summary  Patient ID: Terik Haughey MRN: [id number] DOB/AGE: February 13, 1955 64 y.o.  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 S/P CABG x 5 08/24/2019  . Subsequent ST elevation (STEMI) myocardial infarction of anterior wall within 4 weeks of initial infarction ([place]) 08/20/2019  . 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 Type 2 diabetes mellitus with complication, with long-term current use of insulin ([place]) 08/20/2019  . Hyperlipidemia associated with type 2 diabetes mellitus ([place]) 08/20/2019  . Essential hypertension 08/20/2019  . Multi-vessel coronary artery stenosis 08/20/2019  . HNP (herniated nucleus pulposus) with myelopathy, cervical 07/31/2016      HPI: at time of consultation  Patient is a 64 year old male with no previous history of coronary artery disease but risk factors notable for history of hypertension, insulin-dependent type 1 diabetes mellitus, and hyperlipidemia who was admitted to [facility] with an acute myocardial infarction transferred to [facility] and has now been referred for surgical consultation to discuss treatment options for management of severe multivessel coronary artery disease.  Patient states that he has had decreased energy and worsening fatigue for  nearly a year.  Approximately 2 weeks ago he had an episode of substernal chest pain radiating to his left shoulder and left arm that lasted more than 15 minutes but eventually subsided after he took some aspirin.  Over the past 2 weeks he has had 2 more similar episodes of discomfort which were not quite as severe and resolved within a few minutes.  He was seen by his primary care physician and an EKG was performed that reportedly revealed ST segment elevation.  The patient was sent directly to [facility] in [place] where he was admitted to the hospital and evaluated by Dr. Agustin Cree.  Troponin level was elevated and the patient was subsequently transferred to [facility] where he underwent diagnostic cardiac catheterization by Dr. Ellyn Hack.  Catheterization revealed severe three-vessel coronary artery disease with depressed left ventricular systolic function and mildly elevated left ventricular end-diastolic pressure.  Cardiothoracic surgical consultation was requested.  Patient is married and lives in [place] with his wife.  Both his wife and his daughter are at the bedside for consultation this evening.  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  The patient does not exercise on a regular basis but he reports no particular physical limitations.  He denies any previous history of exertional chest pain, chest tightness, or shortness of breath.  He describes episode of chest discomfort as noted previously and has not had any pain in his chest for nearly 1 week.  He denies any history of PND, orthopnea, or lower extremity edema.  He has not had tachypalpitations, dizzy spells, nor syncope.  The patient was seen in cardiothoracic surgical consultation by Dr. Roxy Manns who evaluated the patient and studies and recommended proceeding with coronary artery surgical revascularization.  Patient was scheduled for Dr. Orvan Seen to perform the  procedure.   Discharged Condition: good  Hospital Course: The patient was admitted in transfer as described above and subsequently was scheduled for the procedure.  On 08/24/2019 he was taken the operating room where he underwent the below described procedure.  He tolerated well and was taken to the surgical intensive care unit in stable condition.  Postoperative hospital course:  The patient has done well.  He was extubated neurologically intact without difficulty.  He initially did require some inotropic support but this was weaned without difficulty.  All routine lines, monitors and drainage devices have been discontinued in the standard fashion.  He did have an expected acute blood loss anemia and values have stabilized.  Most recent hemoglobin hematocrit are 9.5/28.6 respectively.  The patient did have an acute elevation in his creatinine but it is gradually trending towards baseline.  Most recent creatinine on 08/30/2019 is 1.42.  He does have some volume overload and will be discharged on a short course of oral Lasix.  His blood sugars have been under adequate control using standard measures.  He will resume his home diabetic regimen at time of discharge.  Oxygen has been weaned and he maintains good saturations on room air.  Incisions are noted to be healing well without evidence of infection.  He is tolerating diet.  He is tolerating routine cardiac rehab modalities without difficulty.  He is ambulating in the halls.  He is maintaining sinus rhythm.  He has had occasional sinus tach so beta-blocker has been uptitrated for control.  At the time of discharge the patient is felt to be stable for discharge  Consults: cardiology  Significant Diagnostic Studies: angiography: cardiac cath  Treatments: surgery:  CARDIOTHORACIC SURGERY OPERATIVE NOTE  Date of Procedure:    08/24/2019  Preoperative Diagnosis:      Severe 3-vessel Coronary Artery Disease in a diabetic  Postoperative Diagnosis:     Same  Procedure:        Coronary Artery Bypass Grafting x 5             Left Internal Mammary Artery to Distal Left Anterior Descending Coronary Artery; left radial artery Graft to Posterior Descending Coronary Artery; pedicled RIMA to Right PDA; Saphenous Vein Graft to distal Obtuse Marginal Branch of Left Circumflex Coronary Artery; Sapheonous Vein Graft to ramus intermedius Coronary Artery; Endoscopic Vein Harvest from right Thigh;  Open left radial artery harvesting Bilateral internal mammary artery harvesting Multilevel rib block with Exparel solution Completion graft surveillance with indocyanine green fluorescence imaging (SPY)  Surgeon:        B. Murvin Natal, MD  Assistant:       Jadene Pierini, PA-C  Anesthesia:    get  Operative Findings: ? Mildly reduced left ventricular systolic function ? good quality  internal mammary artery conduits ? Good quality saphenous vein conduit ? Fair quality left radial artery conduit ? Fair quality target vessels for grafting   LEFT HEART CATH AND CORONARY ANGIOGRAPHY  Conclusion   Mid LAD lesion is 95% stenosed.  Prox Cx lesion is 85% stenosed.  Ramus-1 lesion is 70% stenosed.  Ramus-2 lesion is 80% stenosed.  Prox Cx to Mid Cx lesion is 70% stenosed.  Ramus-3 lesion is 90% stenosed.  Prox RCA-1 lesion is 30% stenosed.  Prox RCA-2 lesion is 30% stenosed.  Mid RCA lesion is 70% stenosed.  RPDA lesion is 70% stenosed.  The left ventricular ejection fraction is 25-35% by visual estimate.  LV end diastolic pressure is mildly elevated.  There is moderate to severe left ventricular systolic dysfunction.  There is no aortic valve stenosis.   Recommendations  Antiplatelet/Anticoag Recommend Aspirin 54m daily for moderate CAD. Patient has multivessel CAD, recommend CVTS consultation for CABG as he is a diabetic with multivessel disease. Would likely recommend clopidogrel following CABG  We will start IV heparin 8  hours after sheath removal and initiate IV nitroglycerin infusion. Start low-dose beta-blocker and continue home dose of amlodipine Home dose Lantus with sliding scale insulin and meal coverage per home regimen. He is on several psychotropic meds which have been continued.  Discharge Date Anticipated discharge date to be determined.  Surgeon Notes    08/24/2019 3:04 PM Op Note signed by AWonda Olds MD    08/24/2019 2:07 PM Operative Note - Scan signed by Default, Provider, MD    08/24/2019 2:00 PM Brief Op Note signed by AWonda Olds MD  Indications  Subsequent ST elevation (STEMI) myocardial infarction of anterior wall within 4 weeks of initial infarction ([place] [I22.0 (ICD-10-CM)]  Left ventricular aneurysm [I25.3 (ICD-10-CM)]  Procedural Details  Technical Details Cardiologist: Dr. KAgustin Cree Mr. [facility]iais a 64year [facility] by Dr. KAgustin Cree  Just over a week ago the patient had an episode lasting 15 to 20 minutes where he had some chest discomfort, dyspnea and diaphoresis that woke him up from sleep.  It got better after taking some aspirin and he went back to sleep.  This morning he went to his primary care doctor's office for routine evaluation, when he told the story, an EKG was checked and showed some anterior ST elevations with Q waves.  [facility] found to have elevated troponin levels of greater than 10, and persistent almost evolutionary changes of recent anterior MI.  Echocardiogram suggested significantly reduced EF with anteroapical LV aneurysm.  He was seen evaluated by Dr. [facility]Agust[facility]who [facility]  Time Out: Verified patient identification, verified procedure, site/side was marked, verified correct patient position, special equipment/implants available,  medications/allergies/relevent history reviewed, required imaging and test results available. Performed.  Access:  Right Radial Artery: 6 Fr sheath -- Seldinger technique using Micropuncture Kit --> despite several attempts with excellent wire advancement, I was not able to advance the catheter more than 2 inches into the forearm, therefore I chose to abort radial access and switch to femoral -- Direct ultrasound guidance used.  Permanent image obtained and placed on chart.  *Right Common Femoral Artery: 6 Fr Sheath - fluoroscopically guided modified Seldinger Technique  -- Direct ultrasound guidance used.  Permanent image obtained and placed on chart.  Left Heart Catheterization: 5 Fr Catheters advanced or exchanged over a J-wire under direct fluoroscopic guidance into the ascending aorta; JL4 catheter advanced first.  * LV Hemodynamics (LV Gram): JR4 catheter * Left Coronary Artery Cineangiography: JL4 catheter  * Right Coronary Artery  Cineangiography: JR4 catheter   Review of initial angiography revealed: Severe multivessel CAD.  Images were reviewed with Dr. MAngelena Formand we both agree that his best treatment option would be CVTS consultation for CABG  Upon completion of Angiogaphy, the catheter was removed completely out of the body over a wire, without complication.  Femoral / Brachial Sheath(s) removed in the Cath Lab with manual pressure for hemostasis.   Radial sheath removed in the Cardiac  Catheterization lab with TR Band placed for hemostasis.  TR Band: 1540 Hours; 13 mL air  MEDICATIONS SQ Lidocaine 3 mL for radial access and 15 mL for femoral access Estimated blood loss <50 mL.   During this procedure medications were administered to achieve and maintain moderate conscious sedation while the patient's heart rate, blood pressure, and oxygen saturation were continuously monitored and I was present face-to-face 100% of this time.  Medications (Filter: Administrations occurring  from 1456 to 1636 on 08/20/19) (important) Continuous medications are totaled by the amount administered until 08/20/19 1636.  midazolam (VERSED) injection (mg) Total dose:  1 mg  Date/Time  Rate/Dose/Volume Action  08/20/19 1525  1 mg Given    fentaNYL (SUBLIMAZE) injection (mcg) Total dose:  25 mcg  Date/Time  Rate/Dose/Volume Action  08/20/19 1525  25 mcg Given    lidocaine (PF) (XYLOCAINE) 1 % injection (mL) Total volume:  17 mL  Date/Time  Rate/Dose/Volume Action  08/20/19 1529  2 mL Given  1542  15 mL Given    Heparin (Porcine) in NaCl 1000-0.9 UT/500ML-% SOLN (mL) Total volume:  1,000 mL  Date/Time  Rate/Dose/Volume Action  08/20/19 1529  500 mL Given  1529  500 mL Given    iohexol (OMNIPAQUE) 350 MG/ML injection (mL) Total volume:  50 mL  Date/Time  Rate/Dose/Volume Action  08/20/19 1609  50 mL Given    Sedation Time  Sedation Time Physician-1: 38 minutes 23 seconds  Contrast  Medication Name Total Dose  iohexol (OMNIPAQUE) 350 MG/ML injection 50 mL    Radiation/Fluoro  Fluoro time: 4.6 (min) DAP: 8554 (mGycm2) Cumulative Air Kerma: 233 (mGy)  Complications  Complications documented before study signed (08/23/2019 4:35 AM)   No complications were associated with this study.  Documented by Leonie Man, MD - 08/20/2019 5:35 PM    Coronary Findings  Diagnostic Dominance: Right Left Anterior Descending  There is mild diffuse disease throughout the vessel.  Mid LAD lesion is 95% stenosed. Vessel is the culprit lesion. The lesion is located proximal to the major branch, focal, discrete and concentric.  First Diagonal Branch  Vessel is small in size. There is moderate disease in the vessel.  First Septal Branch  Major Septal trunk - several branches  Second Septal Branch  Vessel is small in size.  Third Diagonal Branch  Vessel is small in size.  Ramus Intermedius  Vessel is large. There is moderate diffuse disease throughout the vessel.    Ramus-1 lesion is 70% stenosed. The lesion is segmental, eccentric and irregular.  Ramus-2 lesion is 80% stenosed. The lesion is segmental, eccentric and irregular.  Ramus-3 lesion is 90% stenosed. The lesion is segmental and eccentric.  Left Circumflex  Vessel is small. There is mild diffuse disease throughout the vessel.  Prox Cx lesion is 85% stenosed. The lesion is located at the bend, focal and discrete.  Prox Cx to Mid Cx lesion is 70% stenosed. The lesion is segmental and concentric.  First Obtuse Marginal Branch  Vessel is small in size.  Left Posterior Atrioventricular Artery  Vessel is small in size.  Right Coronary Artery  There is mild diffuse disease throughout the vessel.  Prox RCA-1 lesion is 30% stenosed.  Prox RCA-2 lesion is 30% stenosed.  Mid RCA lesion is 70% stenosed. The lesion is focal and concentric.  Acute Marginal Branch  Vessel is small in size.  Right Ventricular Branch  Vessel is small in size.  Right Posterior Descending Artery  Vessel is small  in size.  RPDA lesion is 70% stenosed. The lesion is focal and concentric.  First Right Posterolateral Branch  Vessel is small in size.  Intervention  No interventions have been documented. Wall Motion  Resting       Borderline aneurysmal dilation          Left Heart  Left Ventricle There is moderate to severe left ventricular systolic dysfunction. LV end diastolic pressure is mildly elevated. The left ventricular ejection fraction is 25-35% by visual estimate. There are LV function abnormalities due to segmental dysfunction.  Aortic Valve There is no aortic valve stenosis.  Coronary Diagrams  Diagnostic Dominance: Right  Intervention        ECHOCARDIOGRAM REPORT       Patient Name:  YONIS CARREON Date of Exam: 08/21/2019  Medical Rec #: [id number]  Height:    64.0 in  Accession #:  [id number]  Weight:    138.0 lb  Date of Birth: Dec 03, 1955   BSA:     1.671 m   Patient Age:  16 years   BP:      107/44 mmHg  Patient Gender: M      HR:      58 bpm.  Exam Location: Inpatient   Procedure: 2D Echo and Intracardiac Opacification Agent   Indications:  Acute myocardial infarction 410    History:    Patient has no prior history of Echocardiogram  examinations.         Risk Factors:Hypertension and Diabetes.    Sonographer:  Darlina Sicilian RDCS  Referring Phys: [place]    1. LVEF is depressed with severe hypokinesis/akinesis of the distal 2/3  of LV.Marland Kitchen Left ventricular ejection fraction, by estimation, is 25 to 30%.  The left ventricle has severely decreased function. The left ventricle  demonstrates regional wall motion  abnormalities (see scoring diagram/findings for description). Left  ventricular diastolic parameters are consistent with Grade II diastolic  dysfunction (pseudonormalization).  2. Right ventricular systolic function is normal. The right ventricular  size is normal.  3. The mitral valve is normal in structure. Trivial mitral valve  regurgitation.  4. The aortic valve is normal in structure. Aortic valve regurgitation is  not visualized.   FINDINGS  Left Ventricle: LVEF is depressed with severe hypokinesis/akinesis of the  distal 2/3 of LV. Left ventricular ejection fraction, by estimation, is 25  to 30%. The left ventricle has severely decreased function. The left  ventricle demonstrates regional  wall motion abnormalities. Definity contrast agent was given IV to  delineate the left ventricular endocardial borders. The left ventricular  internal cavity size was normal in size. There is no left ventricular  hypertrophy. Left ventricular diastolic  parameters are consistent with Grade II diastolic dysfunction  (pseudonormalization).   Right Ventricle: The right ventricular size is normal. No increase in  right ventricular wall thickness. Right  ventricular systolic function is  normal.   Left Atrium: Left atrial size was normal in size.   Right Atrium: Right atrial size was normal in size.   Pericardium: There is no evidence of pericardial effusion.   Mitral Valve: The mitral valve is normal in structure. Trivial mitral  valve regurgitation.   Tricuspid Valve: The tricuspid valve is normal in structure. Tricuspid  valve regurgitation is trivial.   Aortic Valve: The aortic valve is normal in structure. Aortic valve  regurgitation is not visualized.   Pulmonic Valve: The pulmonic valve was normal in structure. Pulmonic valve  regurgitation is not visualized.   Aorta: The aortic root is normal in size and structure.   IAS/Shunts: No atrial level shunt detected by color flow Doppler.     LEFT VENTRICLE  PLAX 2D  LVIDd:     4.50 cm   Diastology  LVIDs:     3.00 cm   LV e' lateral:  6.09 cm/s  LV PW:     1.00 cm   LV E/e' lateral: 14.1  LV IVS:    1.10 cm   LV e' medial:  4.13 cm/s  LVOT diam:   1.60 cm   LV E/e' medial: 20.8  LV SV:     35  LV SV Index:  21  LVOT Area:   2.01 cm    LV Volumes (MOD)  LV vol d, MOD A2C: 138.0 ml  LV vol d, MOD A4C: 131.0 ml  LV vol s, MOD A2C: 86.7 ml  LV vol s, MOD A4C: 78.5 ml  LV SV MOD A2C:   51.3 ml  LV SV MOD A4C:   131.0 ml  LV SV MOD BP:   51.5 ml   RIGHT VENTRICLE  RV S prime:   8.70 cm/s  TAPSE (M-mode): 1.8 cm   LEFT ATRIUM       Index    RIGHT ATRIUM      Index  LA diam:    2.80 cm 1.68 cm/m RA Area:   10.40 cm  LA Vol (A2C):  40.5 ml 24.24 ml/m RA Volume:  22.30 ml 13.35 ml/m  LA Vol (A4C):  35.4 ml 21.19 ml/m  LA Biplane Vol: 37.5 ml 22.44 ml/m  AORTIC VALVE  LVOT Vmax:  86.80 cm/s  LVOT Vmean: 52.100 cm/s  LVOT VTI:  0.173 m    AORTA  Ao Root diam: 3.20 cm   MITRAL VALVE        TRICUSPID VALVE  MV Area (PHT): 5.13 cm  TR Peak grad:  27.2 mmHg  MV  Decel Time: 148 msec  TR Vmax:    261.00 cm/s  MV E velocity: 86.10 cm/s  MV A velocity: 72.40 cm/s SHUNTS  MV E/A ratio: 1.19    Systemic VTI: 0.17 m               Systemic Diam: 1.60 cm   Dorris Carnes MD  Electronically signed by Dorris Carnes MD  Signature Date/Time: 08/21/2019/12:19:35 PM      Final    Discharge Exam: Blood pressure 117/72, pulse 103, temperature 99.8 F (37.7 C), temperature source Oral, resp. rate 21, height _0  (1.626 m), weight 65.1 kg, SpO2 95 %.  General appearance: alert, cooperative and no distress Heart: regular rate and rhythm Lungs: dim in bases Abdomen: benign Extremities: + edema Wound: incis healing well, minor numbnes in one small arrea of left hand Disposition: Discharge disposition: 01-Home or Self Care       Discharge Instructions    Amb Referral to Cardiac Rehabilitation   Complete by: As directed    Referring to [place] CRP 2   Diagnosis:  CABG STEMI     CABG X ___: 5   After initial evaluation and assessments completed: Virtual Based Care may be provided alone or in conjunction with Phase 2 Cardiac Rehab based on patient barriers.: Yes   Discharge patient   Complete by: As directed    Discharge disposition: 01-Home or Self Care   Discharge patient date: 08/30/2019     Allergies as of 08/30/2019  No Known Allergies     Medication List    STOP taking these medications   ARIPiprazole 5 MG tablet Commonly known as: ABILIFY   HYDROcodone-acetaminophen 5-325 MG tablet Commonly known as: NORCO/VICODIN     TAKE these medications   aspirin 81 MG EC tablet Take 1 tablet (81 mg total) by mouth daily. Swallow whole.   atorvastatin 80 MG tablet Commonly known as: LIPITOR Take 1 tablet (80 mg total) by mouth daily. What changed:   medication strength  how much to take  when to take this   clopidogrel 75 MG tablet Commonly known as: PLAVIX Take 1 tablet (75 mg total) by mouth daily.     colchicine 0.6 MG tablet Take 1 tablet (0.6 mg total) by mouth 2 (two) times daily.   desvenlafaxine 100 MG 24 hr tablet Commonly known as: PRISTIQ Take 100 mg by mouth every evening.   famotidine 40 MG tablet Commonly known as: PEPCID Take 40 mg by mouth daily.   furosemide 40 MG tablet Commonly known as: Lasix Take 1 tablet (40 mg total) by mouth daily.   gabapentin 300 MG capsule Commonly known as: NEURONTIN Take 900 mg by mouth every evening.   insulin aspart 100 UNIT/ML injection Commonly known as: novoLOG Inject 3-15 Units into the skin 3 (three) times daily before meals.   isosorbide dinitrate 10 MG tablet Commonly known as: ISORDIL Take 1 tablet (10 mg total) by mouth 3 (three) times daily.   metoprolol tartrate 50 MG tablet Commonly known as: LOPRESSOR Take 0.5 tablets (25 mg total) by mouth 2 (two) times daily.   OCUVITE PRESERVISION PO Take 1 tablet by mouth in the morning and at bedtime.   potassium chloride 10 MEQ tablet Commonly known as: KLOR-CON Take 1 tablet (10 mEq total) by mouth daily.   SLOW FE PO Take 1 tablet by mouth every other day.   traMADol 50 MG tablet Commonly known as: ULTRAM Take 1 tablet (50 mg total) by mouth every 6 (six) hours as needed for up to 7 days for moderate pain.   Tresiba 100 UNIT/ML Soln Generic drug: Insulin Degludec Inject 20 Units into the skin daily.   Vraylar capsule Generic drug: cariprazine Take 3 mg by mouth at bedtime.       Follow-up Information    Wonda Olds, MD Follow up.   Specialty: Cardiothoracic Surgery Why: Please see discharge paperwork for follow-up appointment with your surgeon. Contact information: [address] [place] [address] [phone]        Leonie Man, MD Follow up.   Specialty: Cardiology Why: Please see discharge paperwork for follow-up appointment with cardiology.  Please check MyChart for all appointment follow-ups. Contact  information: [address] [place] [address] [phone]              The patient has been discharged on:   1.Beta Blocker:  Yes Blue.Reese   ]                              No   [   ]                              If No, reason:  2.Ace Inhibitor/ARB: Yes [   ]  No  [ n   ]                                     If No, reason:AKI  3.Statin:   Yes [ y  ]                  No  [   ]                  If No, reason:  4.Ecasa:  Yes  [ y  ]                  No   [   ]                  If No, reason:  Signed: John Giovanni PA-C 08/30/2019, 9:48 AM

## 2019-08-26 NOTE — Progress Notes (Signed)
2 Days Post-Op Procedure(s) (LRB): CORONARY ARTERY BYPASS GRAFTING (CABG), ON PUMP, TIMES FIVE, USING BILATERAL INTERNAL MAMMARY ARTERIES, ENDOSCOPICALLY HARVESTED RIGHT GREATER SAPHENOUS VEIN, AND OPEN LEFT RADIAL ARTERY HARVESTING (N/A) RADIAL ARTERY HARVEST (Left) TRANSESOPHAGEAL ECHOCARDIOGRAM (TEE) (N/A) INDOCYANINE GREEN FLUORESCENCE IMAGING (ICG) (N/A) Subjective: Increased pain with deep breaths and after walking  Objective: Vital signs in last 24 hours: Temp:  [96.5 F (35.8 C)-98.8 F (37.1 C)] 98.7 F (37.1 C) (07/22 0700) Pulse Rate:  [80-92] 92 (07/22 0700) Cardiac Rhythm: Normal sinus rhythm (07/21 2000) Resp:  [10-22] 18 (07/22 0700) BP: (81-115)/(21-57) 97/52 (07/22 0700) SpO2:  [94 %-100 %] 97 % (07/22 0700) Arterial Line BP: (109)/(56) 109/56 (07/21 0900)  Hemodynamic parameters for last 24 hours: PAP: (23)/(13) 23/13 CO:  [3.2 L/min] 3.2 L/min CI:  [1.9 L/min/m2] 1.9 L/min/m2  Intake/Output from previous day: 07/21 0701 - 07/22 0700 In: 1793.4 [P.O.:480; I.V.:1116.2; IV Piggyback:197.3] Out: 1050 [Urine:770; Chest Tube:280] Intake/Output this shift: No intake/output data recorded.  General appearance: alert and cooperative Neurologic: intact Heart: regular rate and rhythm, S1, S2 normal, no murmur, click, rub or gallop Lungs: clear to auscultation bilaterally Abdomen: soft, non-tender; bowel sounds normal; no masses,  no organomegaly Extremities: edema 2+ Wound: dressed, dry  Lab Results: Recent Labs    08/25/19 1700 08/26/19 0434  WBC 14.1* 11.6*  HGB 9.2* 8.8*  HCT 29.5* 27.3*  PLT 188 166   BMET:  Recent Labs    08/25/19 1700 08/26/19 0434  NA 138 137  K 5.0 4.3  CL 108 106  CO2 22 25  GLUCOSE 135* 60*  BUN 21 23  CREATININE 1.55* 1.64*  CALCIUM 8.3* 8.3*    PT/INR:  Recent Labs    08/24/19 1430  LABPROT 18.6*  INR 1.6*   ABG    Component Value Date/Time   PHART 7.338 (L) 08/24/2019 1847   HCO3 22.5 08/24/2019 1847    TCO2 24 08/24/2019 1847   ACIDBASEDEF 3.0 (H) 08/24/2019 1847   O2SAT 99.0 08/24/2019 1847   CBG (last 3)  Recent Labs    08/26/19 0503 08/26/19 0629 08/26/19 0656  GLUCAP 61* 121* 113*    Assessment/Plan: S/P Procedure(s) (LRB): CORONARY ARTERY BYPASS GRAFTING (CABG), ON PUMP, TIMES FIVE, USING BILATERAL INTERNAL MAMMARY ARTERIES, ENDOSCOPICALLY HARVESTED RIGHT GREATER SAPHENOUS VEIN, AND OPEN LEFT RADIAL ARTERY HARVESTING (N/A) RADIAL ARTERY HARVEST (Left) TRANSESOPHAGEAL ECHOCARDIOGRAM (TEE) (N/A) INDOCYANINE GREEN FLUORESCENCE IMAGING (ICG) (N/A) Mobilize Diuresis pt/ot   LOS: 6 days    Jonathan Holder 08/26/2019

## 2019-08-27 ENCOUNTER — Inpatient Hospital Stay (HOSPITAL_COMMUNITY): Payer: BC Managed Care – PPO

## 2019-08-27 LAB — GLUCOSE, CAPILLARY
Glucose-Capillary: 108 mg/dL — ABNORMAL HIGH (ref 70–99)
Glucose-Capillary: 117 mg/dL — ABNORMAL HIGH (ref 70–99)
Glucose-Capillary: 156 mg/dL — ABNORMAL HIGH (ref 70–99)
Glucose-Capillary: 187 mg/dL — ABNORMAL HIGH (ref 70–99)
Glucose-Capillary: 51 mg/dL — ABNORMAL LOW (ref 70–99)
Glucose-Capillary: 75 mg/dL (ref 70–99)
Glucose-Capillary: 89 mg/dL (ref 70–99)

## 2019-08-27 LAB — CBC
HCT: 27.8 % — ABNORMAL LOW (ref 39.0–52.0)
Hemoglobin: 8.7 g/dL — ABNORMAL LOW (ref 13.0–17.0)
MCH: 30.2 pg (ref 26.0–34.0)
MCHC: 31.3 g/dL (ref 30.0–36.0)
MCV: 96.5 fL (ref 80.0–100.0)
Platelets: 159 10*3/uL (ref 150–400)
RBC: 2.88 MIL/uL — ABNORMAL LOW (ref 4.22–5.81)
RDW: 14.5 % (ref 11.5–15.5)
WBC: 9.3 10*3/uL (ref 4.0–10.5)
nRBC: 0 % (ref 0.0–0.2)

## 2019-08-27 LAB — PREPARE RBC (CROSSMATCH)

## 2019-08-27 LAB — BASIC METABOLIC PANEL
Anion gap: 5 (ref 5–15)
BUN: 31 mg/dL — ABNORMAL HIGH (ref 8–23)
CO2: 25 mmol/L (ref 22–32)
Calcium: 8.4 mg/dL — ABNORMAL LOW (ref 8.9–10.3)
Chloride: 107 mmol/L (ref 98–111)
Creatinine, Ser: 1.61 mg/dL — ABNORMAL HIGH (ref 0.61–1.24)
GFR calc Af Amer: 52 mL/min — ABNORMAL LOW (ref >60)
GFR calc non Af Amer: 45 mL/min — ABNORMAL LOW (ref >60)
Glucose, Bld: 88 mg/dL (ref 70–99)
Potassium: 4.6 mmol/L (ref 3.5–5.1)
Sodium: 137 mmol/L (ref 135–145)

## 2019-08-27 MED ORDER — SODIUM CHLORIDE 0.9% IV SOLUTION: Freq: Once | INTRAVENOUS | Status: DC

## 2019-08-27 NOTE — Evaluation (Signed)
Physical Therapy Evaluation Patient Details Name: Jonathan Holder MRN: [id number] DOB: [birth year] Today's Date: 08/27/2019   History of Present Illness  64 yo admitted 7/16 for heart cath after going to PCP and having EKG which revealed ST elevation pt with STEMI. Pt s/p CABG x 5 7/20. PMHx: CAD, HTN, DM, HLD  Clinical Impression  Pt very pleasant and reports being independent PTA and still working. Pt has a house at [facility] and wants to be able to return home and to the beach. Pt educated for precautions with handouts provided. Pt with decreased transfers and gait who will benefit from acute therapy to maximize mobility with adherence to precautions.   HR 105 Pre gait BP 98/60(72) Post gait 120/44 (67)    Follow Up Recommendations No PT follow up    Equipment Recommendations  None recommended by PT    Recommendations for Other Services       Precautions / Restrictions Precautions Precautions: Sternal Precaution Booklet Issued: Yes (comment)      Mobility  Bed Mobility               General bed mobility comments: in bathroom on arrival and chair end of session  Transfers Overall transfer level: Needs assistance   Transfers: Sit to/from Stand Sit to Stand: Min guard         General transfer comment: cues for hand placement  Ambulation/Gait Ambulation/Gait assistance: Min guard Gait Distance (Feet): 400 Feet Assistive device: None Gait Pattern/deviations: Step-through pattern;Decreased stride length   Gait velocity interpretation: 1.31 - 2.62 ft/sec, indicative of limited community ambulator General Gait Details: pt with cautious gait requesting to walk without RW. pt with 2 standing pauses during gait.cues for direction, breathing technique and posture  Stairs            Wheelchair Mobility    Modified Rankin (Stroke Patients Only)       Balance Overall balance assessment: No apparent balance deficits (not formally assessed)                                            Pertinent Vitals/Pain Pain Assessment: No/denies pain    Home Living Family/patient expects to be discharged to:: Private residence Living Arrangements: Spouse/significant other Available Help at Discharge: Family;Available 24 hours/day Type of Home: House Home Access: Stairs to enter   [employer] of Steps: 1 Home Layout: One level Home Equipment: None      Prior Function Level of Independence: Independent         Comments: 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        Extremity/Trunk Assessment   Upper Extremity Assessment Upper Extremity Assessment: Overall WFL for tasks assessed    Lower Extremity Assessment Lower Extremity Assessment: Overall WFL for tasks assessed    Cervical / Trunk Assessment Cervical / Trunk Assessment: Other exceptions Cervical / Trunk Exceptions: forward head  Communication   Communication: No difficulties  Cognition Arousal/Alertness: Awake/alert Behavior During Therapy: WFL for tasks assessed/performed Overall Cognitive Status: Within Functional Limits for tasks assessed                                        General Comments      Exercises General Exercises -  Lower Extremity Long Arc Quad: AROM;Both;10 reps;Seated   Assessment/Plan    PT Assessment Patient needs continued PT services  PT Problem List Decreased mobility;Decreased activity tolerance;Decreased knowledge of precautions       PT Treatment Interventions Gait training;Functional mobility training;Stair training;Therapeutic exercise;Patient/family education;Therapeutic activities    PT Goals (Current goals can be found in the Care Plan section)  Acute Rehab PT Goals Patient Stated Goal: return home and to the beach PT Goal Formulation: With patient Time For Goal Achievement: 09/03/19 Potential to Achieve Goals: Good    Frequency Min 3X/week    Barriers to discharge        Co-evaluation               AM-PAC PT "6 Clicks" Mobility  Outcome Measure Help needed turning from your back to your side while in a flat bed without using bedrails?: A Little Help needed moving from lying on your back to sitting on the side of a flat bed without using bedrails?: A Little Help needed moving to and from a bed to a chair (including a wheelchair)?: A Little Help needed standing up from a chair using your arms (e.g., wheelchair or bedside chair)?: A Little Help needed to walk in hospital room?: A Little Help needed climbing 3-5 steps with a railing? : A Little 6 Click Score: 18    End of Session   Activity Tolerance: Patient tolerated treatment well Patient left: in chair;with call bell/phone within reach Nurse Communication: Mobility status PT Visit Diagnosis: Other abnormalities of gait and mobility (R26.89)    Time: 5374-8270 PT Time Calculation (min) (ACUTE ONLY): 22 min   Charges:   PT Evaluation $PT Eval Moderate Complexity: 1 Mod          Molli Gethers P, PT Acute Rehabilitation Services Pager: [phone] Office: [phone]   Beonka Amesquita B Sharayah Renfrow 08/27/2019, 10:14 AM

## 2019-08-27 NOTE — Progress Notes (Signed)
3 Days Post-Op Procedure(s) (LRB): CORONARY ARTERY BYPASS GRAFTING (CABG), ON PUMP, TIMES FIVE, USING BILATERAL INTERNAL MAMMARY ARTERIES, ENDOSCOPICALLY HARVESTED RIGHT GREATER SAPHENOUS VEIN, AND OPEN LEFT RADIAL ARTERY HARVESTING (N/A) RADIAL ARTERY HARVEST (Left) TRANSESOPHAGEAL ECHOCARDIOGRAM (TEE) (N/A) INDOCYANINE GREEN FLUORESCENCE IMAGING (ICG) (N/A) Subjective: No complaints  Objective: Vital signs in last 24 hours: Temp:  [97.8 F (36.6 C)-98.8 F (37.1 C)] 98.5 F (36.9 C) (07/23 0741) Pulse Rate:  [85-100] 93 (07/23 0700) Cardiac Rhythm: Normal sinus rhythm (07/22 2000) Resp:  [0-23] 23 (07/23 0700) BP: (81-134)/(32-111) 129/32 (07/23 0700) SpO2:  [93 %-100 %] 96 % (07/23 0700) Weight:  [68.7 kg] 68.7 kg (07/23 0500)  Hemodynamic parameters for last 24 hours:    Intake/Output from previous day: 07/22 0701 - 07/23 0700 In: 630 [P.O.:630] Out: 1695 [Urine:1645; Chest Tube:50] Intake/Output this shift: No intake/output data recorded.  General appearance: alert and cooperative Neurologic: intact Heart: regular rate and rhythm, S1, S2 normal, no murmur, click, rub or gallop Lungs: clear to auscultation bilaterally Abdomen: soft, non-tender; bowel sounds normal; no masses,  no organomegaly Extremities: edema 2+ Wound: dressed  Lab Results: Recent Labs    08/26/19 1900 08/27/19 0424  WBC 11.2* 9.3  HGB 8.3* 8.7*  HCT 26.4* 27.8*  PLT 154 159   BMET:  Recent Labs    08/26/19 1900 08/27/19 0424  NA 137 137  K 4.3 4.6  CL 106 107  CO2 23 25  GLUCOSE 124* 88  BUN 32* 31*  CREATININE 1.51* 1.61*  CALCIUM 8.3* 8.4*    PT/INR:  Recent Labs    08/24/19 1430  LABPROT 18.6*  INR 1.6*   ABG    Component Value Date/Time   PHART 7.338 (L) 08/24/2019 1847   HCO3 22.5 08/24/2019 1847   TCO2 24 08/24/2019 1847   ACIDBASEDEF 3.0 (H) 08/24/2019 1847   O2SAT 99.0 08/24/2019 1847   CBG (last 3)  Recent Labs    08/27/19 0044 08/27/19 0450 08/27/19  0701  GLUCAP 108* 75 89    Assessment/Plan: S/P Procedure(s) (LRB): CORONARY ARTERY BYPASS GRAFTING (CABG), ON PUMP, TIMES FIVE, USING BILATERAL INTERNAL MAMMARY ARTERIES, ENDOSCOPICALLY HARVESTED RIGHT GREATER SAPHENOUS VEIN, AND OPEN LEFT RADIAL ARTERY HARVESTING (N/A) RADIAL ARTERY HARVEST (Left) TRANSESOPHAGEAL ECHOCARDIOGRAM (TEE) (N/A) INDOCYANINE GREEN FLUORESCENCE IMAGING (ICG) (N/A) Plan for transfer to step-down: see transfer orders   LOS: 7 days    Linden Dolin 08/27/2019

## 2019-08-27 NOTE — Progress Notes (Signed)
Patient ID: [name], male   DOB: 27-Jul-1955, 64 y.o.   MRN: [id number] EVENING ROUNDS NOTE :     [address]       [place] [id number]             [phone]                 3 Days Post-Op Procedure(s) (LRB): CORONARY ARTERY BYPASS GRAFTING (CABG), ON PUMP, TIMES FIVE, USING BILATERAL INTERNAL MAMMARY ARTERIES, ENDOSCOPICALLY HARVESTED RIGHT GREATER SAPHENOUS VEIN, AND OPEN LEFT RADIAL ARTERY HARVESTING (N/A) RADIAL ARTERY HARVEST (Left) TRANSESOPHAGEAL ECHOCARDIOGRAM (TEE) (N/A) INDOCYANINE GREEN FLUORESCENCE IMAGING (ICG) (N/A)  Total Length of Stay:  LOS: 7 days  BP (!) 94/43   Pulse 103   Temp 98.2 F (36.8 C) (Oral)   Resp 23   Ht 5\' 4"  (1.626 m)   Wt 68.7 kg   SpO2 93%   BMI 26.00 kg/m   .Intake/Output      07/22 0701 - 07/23 0700 07/23 0701 - 07/24 0700   P.O. 630 540   I.V. (mL/kg)     IV Piggyback     Total Intake(mL/kg) 630 (9.2) 540 (7.9)   Urine (mL/kg/hr) 1645 (1) 900 (1.2)   Stool  0   Chest Tube 50    Total Output 1695 900   Net -1065 -360        Urine Occurrence  2 x   Stool Occurrence  2 x        Lab Results  Component Value Date   WBC 9.3 08/27/2019   HGB 8.7 (L) 08/27/2019   HCT 27.8 (L) 08/27/2019   PLT 159 08/27/2019   GLUCOSE 88 08/27/2019   CHOL 86 08/21/2019   TRIG 40 08/21/2019   HDL 30 (L) 08/21/2019   LDLCALC 48 08/21/2019   ALT 9 08/21/2019   AST 13 (L) 08/21/2019   NA 137 08/27/2019   K 4.6 08/27/2019   CL 107 08/27/2019   CREATININE 1.61 (H) 08/27/2019   BUN 31 (H) 08/27/2019   CO2 25 08/27/2019   INR 1.6 (H) 08/24/2019   HGBA1C 9.7 (H) 08/24/2019   Stable day  Getting one unit prbcs    08/26/2019 MD  Beeper [phone] Office [phone] 08/27/2019 6:24 PM

## 2019-08-27 NOTE — Progress Notes (Addendum)
Results for Jonathan Holder, Jonathan Holder (MRN [id number]) as of 08/27/2019 09:55  Ref. Range 08/26/2019 20:06 08/27/2019 00:05 08/27/2019 00:44 08/27/2019 04:50 08/27/2019 07:01  Glucose-Capillary Latest Ref Range: 70 - 99 mg/dL 703 (H) 51 (L) 500 (H) 75 89  Noted that patient continues to have low blood sugars. Had a 51 mg/dl last night at 9381 and 57/60 mg/dl on 8/29 at 9371.  Recommend decreasing Novolog correction scale to SENSITIVE (0-9 units) TID & HS scale if eating and if blood sugars continue to be less than 70 mg/dl.   Smith Mince RN BSN CDE Diabetes Coordinator Pager: [phone]  8am-5pm

## 2019-08-27 NOTE — Progress Notes (Signed)
OT Evaluation  PTA, pt lived with wife, worked part time in his [employer] and was independent with ADL and mobility. Began education on sternal precautions for ADL tasks and functional mobility for ADL. Pt currently requires mod A with LB ADL due to below listed deficits. Will follow acutely to facilitate safe DC home with wife with intermittent S. Pt very appreciative.     08/27/19 1300  OT Visit Information  Last OT Received On 08/27/19  Assistance Needed +1  History of Present Illness 64 yo admitted 7/16 for heart cath after going to PCP and having EKG which revealed ST elevation pt with STEMI. Pt s/p CABG x 5 7/20. 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, international aid No difficulties  Pain Assessment  Pain Assessment No/denies pain  Cognition  Arousal/Alertness Awake/alert  Behavior During Therapy WFL for tasks assessed/performed  Overall Cognitive Status Within Functional Limits for tasks assessed  Upper Extremity Assessment  Upper Extremity Assessment LUE deficits/detail  LUE Deficits / Details incision; stiff/sore but using functionally  Lower Extremity Assessment  Lower Extremity Assessment Defer to PT evaluation  Cervical / Trunk Assessment  Cervical / Trunk Assessment Other exceptions  Cervical / Trunk Exceptions forward head  ADL  Overall ADL's  Needs assistance/impaired   Eating/Feeding Modified independent  Grooming Set up;Sitting  Upper Body Bathing Set up;Supervision/ safety;Sitting  Lower Body Bathing Moderate assistance;Sit to/from stand  Upper Body Dressing  Moderate assistance;Sitting  Lower Body Dressing Moderate assistance;Sit to/from Scientist, research (life sciences) Minimal assistance;Ambulation;Comfort height toilet  Toilet Transfer Details (indicate cue type and reason) steady assist  Toileting- Clothing Manipulation and Hygiene Moderate assistance  Functional mobility during ADLs Minimal assistance  General ADL Comments Began educating in sternal precautions for ADL tasks; handouts reviewd  Bed Mobility  Overal bed mobility Needs Assistance  Bed Mobility Rolling;Supine to Sit  Rolling Modified independent (Device/Increase time)  Supine to sit Min assist  General bed mobility comments using pillow; assist to fully transition upright  Transfers  Overall transfer level Needs assistance  Transfers Sit to/from Stand;Stand Pivot Transfers  Sit to Stand Min guard  Stand pivot transfers Min assist (steady)  Balance  Overall balance assessment No apparent balance deficits (not formally assessed)  OT - End of Session  Activity Tolerance Patient tolerated treatment well  Patient left Other (comment) (in bathroom; nsg aware)  Nurse Communication Mobility status  OT Assessment  OT Recommendation/Assessment Patient needs continued OT Services  OT Visit Diagnosis Unsteadiness on feet (R26.81);Muscle weakness (generalized) (M62.81)  OT Problem List Decreased activity tolerance;Decreased knowledge of use of DME or AE;Decreased knowledge of precautions;Cardiopulmonary status limiting activity  OT Plan  OT Frequency (ACUTE ONLY) Min 2X/week  OT Treatment/Interventions (ACUTE ONLY) Self-care/ADL training;Therapeutic exercise;Energy conservation;DME and/or AE instruction;Therapeutic activities;Patient/family education  AM-PAC OT "6 Clicks" Daily Activity Outcome  Measure (Version 2)  Help from another person eating meals? 4  Help from another person taking care of personal grooming? 3  Help from another person toileting, which includes using toliet, bedpan, or urinal? 2  Help from another person bathing (including washing, rinsing, drying)? 2  Help from another person to put on and taking off regular upper body clothing? 2  Help from another person to put on and taking off regular lower body clothing? 2  6 Click Score 15  OT Recommendation  Follow Up Recommendations No OT follow up;Supervision - Intermittent  OT Equipment 3 in 1 bedside commode (to use as shower chair/ over low toilet)  Individuals Consulted  Consulted and Agree with Results and Recommendations Patient  Acute Rehab OT Goals  Patient Stated Goal return home and to the beach  OT Goal Formulation With patient  Time For Goal Achievement 09/10/19  Potential to Achieve Goals Good  OT Time Calculation  OT Start Time (ACUTE ONLY) 1307  OT Stop Time (ACUTE ONLY) 1334  OT Time Calculation (min) 27 min  OT General Charges  $OT Visit 1 Visit  OT Evaluation  $OT Eval Moderate Complexity 1 Mod  OT Treatments  $Self Care/Home Management  8-22 mins  Written Expression  Dominant Hand Right  Luisa Dago, OT/L   Acute OT Clinical Specialist Acute Rehabilitation Services Pager [phone] Office [phone]

## 2019-08-28 ENCOUNTER — Inpatient Hospital Stay (HOSPITAL_COMMUNITY): Payer: BC Managed Care – PPO

## 2019-08-28 LAB — CBC
HCT: 28.6 % — ABNORMAL LOW (ref 39.0–52.0)
Hemoglobin: 9.5 g/dL — ABNORMAL LOW (ref 13.0–17.0)
MCH: 31 pg (ref 26.0–34.0)
MCHC: 33.2 g/dL (ref 30.0–36.0)
MCV: 93.5 fL (ref 80.0–100.0)
Platelets: 206 10*3/uL (ref 150–400)
RBC: 3.06 MIL/uL — ABNORMAL LOW (ref 4.22–5.81)
RDW: 14 % (ref 11.5–15.5)
WBC: 7 10*3/uL (ref 4.0–10.5)
nRBC: 0 % (ref 0.0–0.2)

## 2019-08-28 LAB — TYPE AND SCREEN
ABO/RH(D): O POS
Antibody Screen: NEGATIVE
Unit division: 0

## 2019-08-28 LAB — BASIC METABOLIC PANEL
Anion gap: 9 (ref 5–15)
BUN: 28 mg/dL — ABNORMAL HIGH (ref 8–23)
CO2: 25 mmol/L (ref 22–32)
Calcium: 8.1 mg/dL — ABNORMAL LOW (ref 8.9–10.3)
Chloride: 105 mmol/L (ref 98–111)
Creatinine, Ser: 1.49 mg/dL — ABNORMAL HIGH (ref 0.61–1.24)
GFR calc Af Amer: 57 mL/min — ABNORMAL LOW (ref >60)
GFR calc non Af Amer: 49 mL/min — ABNORMAL LOW (ref >60)
Glucose, Bld: 109 mg/dL — ABNORMAL HIGH (ref 70–99)
Potassium: 3.9 mmol/L (ref 3.5–5.1)
Sodium: 139 mmol/L (ref 135–145)

## 2019-08-28 LAB — GLUCOSE, CAPILLARY
Glucose-Capillary: 101 mg/dL — ABNORMAL HIGH (ref 70–99)
Glucose-Capillary: 109 mg/dL — ABNORMAL HIGH (ref 70–99)
Glucose-Capillary: 165 mg/dL — ABNORMAL HIGH (ref 70–99)
Glucose-Capillary: 184 mg/dL — ABNORMAL HIGH (ref 70–99)
Glucose-Capillary: 211 mg/dL — ABNORMAL HIGH (ref 70–99)
Glucose-Capillary: 79 mg/dL (ref 70–99)

## 2019-08-28 LAB — BPAM RBC
Blood Product Expiration Date: 202108272359
ISSUE DATE / TIME: 202107231712
Unit Type and Rh: 5100

## 2019-08-28 MED ORDER — METOPROLOL TARTRATE 25 MG/10 ML ORAL SUSPENSION
12.5000 mg | Freq: Two times a day (BID) | ORAL | Status: DC
  Filled 2019-08-28 (×2): qty 5

## 2019-08-28 MED ORDER — INSULIN ASPART 100 UNIT/ML ~~LOC~~ SOLN
0.0000 [IU] | Freq: Three times a day (TID) | SUBCUTANEOUS | Status: DC
  Administered 2019-08-28: 4 [IU] via SUBCUTANEOUS

## 2019-08-28 MED ORDER — METOPROLOL TARTRATE 12.5 MG HALF TABLET
12.5000 mg | ORAL_TABLET | Freq: Two times a day (BID) | ORAL | Status: DC
  Administered 2019-08-28 – 2019-08-29 (×3): 12.5 mg via ORAL
  Filled 2019-08-28 (×3): qty 1

## 2019-08-28 NOTE — Progress Notes (Signed)
Patient arrived to 4E room 22 at this time. Telemetry applied and CCMD notified. V/s and assessment done. CHG bath complete. Patient oriented to room and how to call nurse with any needs. Will continue to monitor.

## 2019-08-28 NOTE — Progress Notes (Signed)
Patient ID: Jonathan Holder, male   DOB: 1955/05/07, 64 y.o.   MRN: [id number] TCTS DAILY ICU PROGRESS NOTE                   [address]            [place] [address]          [phone]   4 Days Post-Op Procedure(s) (LRB): CORONARY ARTERY BYPASS GRAFTING (CABG), ON PUMP, TIMES FIVE, USING BILATERAL INTERNAL MAMMARY ARTERIES, ENDOSCOPICALLY HARVESTED RIGHT GREATER SAPHENOUS VEIN, AND OPEN LEFT RADIAL ARTERY HARVESTING (N/A) RADIAL ARTERY HARVEST (Left) TRANSESOPHAGEAL ECHOCARDIOGRAM (TEE) (N/A) INDOCYANINE GREEN FLUORESCENCE IMAGING (ICG) (N/A)  Total Length of Stay:  LOS: 8 days   Subjective: Feels well this am, blood given yesterday , off pressors   Objective: Vital signs in last 24 hours: Temp:  [98.2 F (36.8 C)-98.7 F (37.1 C)] 98.3 F (36.8 C) (07/24 0650) Pulse Rate:  [85-105] 100 (07/24 0900) Cardiac Rhythm: Normal sinus rhythm (07/24 0800) Resp:  [0-23] 21 (07/24 0900) BP: (71-132)/(38-99) 129/60 (07/24 0900) SpO2:  [92 %-99 %] 92 % (07/24 0900) Weight:  [66.5 kg] 66.5 kg (07/24 0500)  Filed Weights   08/25/19 0500 08/27/19 0500 08/28/19 0500  Weight: 64.4 kg 68.7 kg 66.5 kg    Weight change: -2.2 kg   Hemodynamic parameters for last 24 hours:    Intake/Output from previous day: 07/23 0701 - 07/24 0700 In: 540 [P.O.:540] Out: 2000 [Urine:2000]  Intake/Output this shift: Total I/O In: 240 [P.O.:240] Out: 450 [Urine:450]  Current Meds: Scheduled Meds: . sodium chloride   Intravenous Once  . acetaminophen  1,000 mg Oral Q6H   Or  . acetaminophen (TYLENOL) oral liquid 160 mg/5 mL  1,000 mg Per Tube Q6H  . aspirin EC  81 mg Oral Daily  . atorvastatin  80 mg Oral Daily  . bisacodyl  10 mg Oral Daily   Or  . bisacodyl  10 mg Rectal Daily  . Chlorhexidine Gluconate Cloth  6 each Topical Daily  . clopidogrel  75 mg Oral Daily  . colchicine  0.6 mg Oral BID  . desvenlafaxine  100 mg Oral BH-q7a  . docusate sodium  200 mg Oral Daily  .  enoxaparin (LOVENOX) injection  40 mg Subcutaneous QHS  . gabapentin  900 mg Oral BH-q7a  . insulin aspart  0-24 Units Subcutaneous Q4H  . insulin detemir  10 Units Subcutaneous Daily  . isosorbide dinitrate  10 mg Oral TID  . mouth rinse  15 mL Mouth Rinse BID  . metoprolol tartrate  12.5 mg Oral BID   Or  . metoprolol tartrate  12.5 mg Per Tube BID  . pantoprazole  40 mg Oral Daily   Continuous Infusions: PRN Meds:.dextrose, metoprolol tartrate, ondansetron (ZOFRAN) IV, oxyCODONE, traMADol  General appearance: alert, cooperative and no distress Neurologic: intact Heart: regular rate and rhythm, S1, S2 normal, no murmur, click, rub or gallop Lungs: clear to auscultation bilaterally Abdomen: soft, non-tender; bowel sounds normal; no masses,  no organomegaly Extremities: extremities normal, atraumatic, no cyanosis or edema, Homans sign is negative, no sign of DVT and left radial harvest incision intact, hand neurovascular intact Wound: sternum stable  Lab Results: CBC: Recent Labs    08/27/19 0424 08/28/19 0328  WBC 9.3 7.0  HGB 8.7* 9.5*  HCT 27.8* 28.6*  PLT 159 206   BMET:  Recent Labs    08/27/19 0424 08/28/19 0328  NA 137 139  K 4.6 3.9  CL 107 105  CO2 25 25  GLUCOSE 88 109*  BUN 31* 28*  CREATININE 1.61* 1.49*  CALCIUM 8.4* 8.1*    CMET: Lab Results  Component Value Date   WBC 7.0 08/28/2019   HGB 9.5 (L) 08/28/2019   HCT 28.6 (L) 08/28/2019   PLT 206 08/28/2019   GLUCOSE 109 (H) 08/28/2019   CHOL 86 08/21/2019   TRIG 40 08/21/2019   HDL 30 (L) 08/21/2019   LDLCALC 48 08/21/2019   ALT 9 08/21/2019   AST 13 (L) 08/21/2019   NA 139 08/28/2019   K 3.9 08/28/2019   CL 105 08/28/2019   CREATININE 1.49 (H) 08/28/2019   BUN 28 (H) 08/28/2019   CO2 25 08/28/2019   INR 1.6 (H) 08/24/2019   HGBA1C 9.7 (H) 08/24/2019      PT/INR: No results for input(s): LABPROT, INR in the last 72 hours. Radiology: DG Chest 2 View  Result Date:  08/28/2019 CLINICAL DATA:  Coronary artery disease.  Angina. EXAM: CHEST - 2 VIEW COMPARISON:  08/27/2019 FINDINGS: Right IJ catheter sheath is again identified with tip in the projection of the SVC. Previous median sternotomy and CABG procedure. Normal heart size. Small bilateral pleural effusions are identified. Platelike atelectasis noted in the left upper lobe and right midlung. IMPRESSION: 1. Small bilateral pleural effusions. 2. Bilateral platelike atelectasis. Electronically Signed   By: Jonathan Holder M.D.   On: 08/28/2019 08:13     Assessment/Plan: S/P Procedure(s) (LRB): CORONARY ARTERY BYPASS GRAFTING (CABG), ON PUMP, TIMES FIVE, USING BILATERAL INTERNAL MAMMARY ARTERIES, ENDOSCOPICALLY HARVESTED RIGHT GREATER SAPHENOUS VEIN, AND OPEN LEFT RADIAL ARTERY HARVESTING (N/A) RADIAL ARTERY HARVEST (Left) TRANSESOPHAGEAL ECHOCARDIOGRAM (TEE) (N/A) INDOCYANINE GREEN FLUORESCENCE IMAGING (ICG) (N/A) Mobilize Plan for transfer to step-down: see transfer orders Expected Acute  Blood - loss Anemia- continue to monitor     Jonathan Holder 08/28/2019 9:37 AM

## 2019-08-29 LAB — GLUCOSE, CAPILLARY
Glucose-Capillary: 126 mg/dL — ABNORMAL HIGH (ref 70–99)
Glucose-Capillary: 170 mg/dL — ABNORMAL HIGH (ref 70–99)
Glucose-Capillary: 171 mg/dL — ABNORMAL HIGH (ref 70–99)
Glucose-Capillary: 241 mg/dL — ABNORMAL HIGH (ref 70–99)

## 2019-08-29 MED ORDER — METOPROLOL TARTRATE 25 MG PO TABS
25.0000 mg | ORAL_TABLET | Freq: Two times a day (BID) | ORAL | Status: DC
  Administered 2019-08-29 – 2019-08-30 (×2): 25 mg via ORAL
  Filled 2019-08-29 (×2): qty 1

## 2019-08-29 MED ORDER — METOPROLOL TARTRATE 25 MG/10 ML ORAL SUSPENSION: 12.5000 mg | Freq: Two times a day (BID) | ORAL | Status: DC

## 2019-08-29 MED ORDER — METOPROLOL TARTRATE 25 MG PO TABS: 25.0000 mg | ORAL_TABLET | Freq: Two times a day (BID) | ORAL | Status: DC

## 2019-08-29 MED ORDER — INSULIN ASPART 100 UNIT/ML ~~LOC~~ SOLN
0.0000 [IU] | Freq: Three times a day (TID) | SUBCUTANEOUS | Status: DC
  Administered 2019-08-29: 4 [IU] via SUBCUTANEOUS
  Administered 2019-08-29: 8 [IU] via SUBCUTANEOUS
  Administered 2019-08-30: 2 [IU] via SUBCUTANEOUS

## 2019-08-29 MED ORDER — METOPROLOL TARTRATE 25 MG/10 ML ORAL SUSPENSION: 25.0000 mg | Freq: Two times a day (BID) | ORAL | Status: DC

## 2019-08-29 MED ORDER — METOPROLOL TARTRATE 25 MG/10 ML ORAL SUSPENSION
25.0000 mg | Freq: Two times a day (BID) | ORAL | Status: DC
  Filled 2019-08-29: qty 10

## 2019-08-29 NOTE — Progress Notes (Addendum)
[address]       [place] [address]             [phone]      5 Days Post-Op Procedure(s) (LRB): CORONARY ARTERY BYPASS GRAFTING (CABG), ON PUMP, TIMES FIVE, USING BILATERAL INTERNAL MAMMARY ARTERIES, ENDOSCOPICALLY HARVESTED RIGHT GREATER SAPHENOUS VEIN, AND OPEN LEFT RADIAL ARTERY HARVESTING (N/A) RADIAL ARTERY HARVEST (Left) TRANSESOPHAGEAL ECHOCARDIOGRAM (TEE) (N/A) INDOCYANINE GREEN FLUORESCENCE IMAGING (ICG) (N/A) Subjective: Feels pretty well  A little tachy at times110's  Objective: Vital signs in last 24 hours: Temp:  [97.8 F (36.6 C)-99.3 F (37.4 C)] 98.6 F (37 C) (07/25 0836) Pulse Rate:  [90-110] 110 (07/25 0836) Cardiac Rhythm: Sinus tachycardia (07/25 0840) Resp:  [0-21] 19 (07/25 0836) BP: (105-150)/(47-63) 110/48 (07/25 0836) SpO2:  [95 %-100 %] 95 % (07/25 0836) Weight:  [65 kg] 65 kg (07/25 0126)  Hemodynamic parameters for last 24 hours:    Intake/Output from previous day: 07/24 0701 - 07/25 0700 In: 480 [P.O.:480] Out: 450 [Urine:450] Intake/Output this shift: Total I/O In: 120 [P.O.:120] Out: -   General appearance: alert, cooperative and no distress Heart: regular rate and rhythm and tachy Lungs: min dim in bases Abdomen: benign Extremities: no edema Wound: incis healing well, left hand n/v intact  Lab Results: Recent Labs    08/27/19 0424 08/28/19 0328  WBC 9.3 7.0  HGB 8.7* 9.5*  HCT 27.8* 28.6*  PLT 159 206   BMET:  Recent Labs    08/27/19 0424 08/28/19 0328  NA 137 139  K 4.6 3.9  CL 107 105  CO2 25 25  GLUCOSE 88 109*  BUN 31* 28*  CREATININE 1.61* 1.49*  CALCIUM 8.4* 8.1*    PT/INR: No results for input(s): LABPROT, INR in the last 72 hours. ABG    Component Value Date/Time   PHART 7.338 (L) 08/24/2019 1847   HCO3 22.5 08/24/2019 1847   TCO2 24 08/24/2019 1847   ACIDBASEDEF 3.0 (H) 08/24/2019 1847   O2SAT 99.0 08/24/2019 1847   CBG (last 3)  Recent Labs    08/28/19 1553  08/28/19 2147 08/29/19 0613  GLUCAP 211* 184* 126*    Meds Scheduled Meds: . sodium chloride   Intravenous Once  . acetaminophen  1,000 mg Oral Q6H   Or  . acetaminophen (TYLENOL) oral liquid 160 mg/5 mL  1,000 mg Per Tube Q6H  . aspirin EC  81 mg Oral Daily  . atorvastatin  80 mg Oral Daily  . bisacodyl  10 mg Oral Daily   Or  . bisacodyl  10 mg Rectal Daily  . Chlorhexidine Gluconate Cloth  6 each Topical Daily  . clopidogrel  75 mg Oral Daily  . colchicine  0.6 mg Oral BID  . desvenlafaxine  100 mg Oral BH-q7a  . docusate sodium  200 mg Oral Daily  . enoxaparin (LOVENOX) injection  40 mg Subcutaneous QHS  . gabapentin  900 mg Oral BH-q7a  . insulin aspart  0-24 Units Subcutaneous TID PC & HS  . insulin detemir  10 Units Subcutaneous Daily  . isosorbide dinitrate  10 mg Oral TID  . mouth rinse  15 mL Mouth Rinse BID  . metoprolol tartrate  12.5 mg Oral BID   Or  . metoprolol tartrate  12.5 mg Per Tube BID  . pantoprazole  40 mg Oral Daily   Continuous Infusions: PRN Meds:.dextrose, metoprolol tartrate, ondansetron (ZOFRAN) IV, oxyCODONE, traMADol  Xrays DG Chest 2 View  Result Date: 08/28/2019 CLINICAL DATA:  Coronary artery disease.  Angina. EXAM: CHEST - 2 VIEW COMPARISON:  08/27/2019 FINDINGS: Right IJ catheter sheath is again identified with tip in the projection of the SVC. Previous median sternotomy and CABG procedure. Normal heart size. Small bilateral pleural effusions are identified. Platelike atelectasis noted in the left upper lobe and right midlung. IMPRESSION: 1. Small bilateral pleural effusions. 2. Bilateral platelike atelectasis. Electronically Signed   By: Signa Kell M.D.   On: 08/28/2019 08:13    Assessment/Plan: S/P Procedure(s) (LRB): CORONARY ARTERY BYPASS GRAFTING (CABG), ON PUMP, TIMES FIVE, USING BILATERAL INTERNAL MAMMARY ARTERIES, ENDOSCOPICALLY HARVESTED RIGHT GREATER SAPHENOUS VEIN, AND OPEN LEFT RADIAL ARTERY HARVESTING (N/A) RADIAL  ARTERY HARVEST (Left) TRANSESOPHAGEAL ECHOCARDIOGRAM (TEE) (N/A) INDOCYANINE GREEN FLUORESCENCE IMAGING (ICG) (N/A)  1 doing well 2 afeb, VSS, a little tachy with some SBP HTN at times- will increase beta blocker dose. No ACE-I /ARB with minor AKI 3 sats good on RA 4 no new labs, creat trending down yesterday- will recheck in am 5 d/c wires today 6 adeq BS control, resume home diabetic meds  at d/c- will need good PMD follow up  7 routine pulm toi;et/cardiac rehab 8 prob home in am in no new issues  LOS: 9 days    Jonathan Clack PA-C Pager [phone] 08/29/2019  Close to d/c home - tomorrow I have seen and examined Jonathan Holder and agree with the above assessment  and plan.  [name] MD Beeper [phone] Office [phone] 08/29/2019 2:11 PM

## 2019-08-29 NOTE — Progress Notes (Signed)
Removed epicardial wires per order. 4 intact.  Pt tolerated procedure well.  Pt instructed to remain on bedrest for one hour.  Frequent vitals will be taken and documented. Pt resting with call bell within reach. ° °

## 2019-08-29 NOTE — Progress Notes (Signed)
Mobility Specialist: Progress Note    08/29/19 1600  Mobility  Activity Ambulated in hall  Level of Assistance Independent  Distance Ambulated (ft) 470 ft  Mobility Response Tolerated well  Mobility performed by Mobility specialist  $Mobility charge 1 Mobility   Pre-Mobility: 73 HR, 104/53 BP, 94% SpO2 Post-Mobility: 69 HR, 116/59 BP, 97% SpO2  Development worker, international aid

## 2019-08-30 ENCOUNTER — Other Ambulatory Visit: Payer: Self-pay | Admitting: Surgical

## 2019-08-30 ENCOUNTER — Inpatient Hospital Stay (HOSPITAL_COMMUNITY): Payer: BC Managed Care – PPO

## 2019-08-30 LAB — GLUCOSE, CAPILLARY: Glucose-Capillary: 143 mg/dL — ABNORMAL HIGH (ref 70–99)

## 2019-08-30 LAB — BASIC METABOLIC PANEL
Anion gap: 11 (ref 5–15)
BUN: 22 mg/dL (ref 8–23)
CO2: 22 mmol/L (ref 22–32)
Calcium: 8.6 mg/dL — ABNORMAL LOW (ref 8.9–10.3)
Chloride: 103 mmol/L (ref 98–111)
Creatinine, Ser: 1.42 mg/dL — ABNORMAL HIGH (ref 0.61–1.24)
GFR calc Af Amer: >60 mL/min (ref >60)
GFR calc non Af Amer: 52 mL/min — ABNORMAL LOW (ref >60)
Glucose, Bld: 153 mg/dL — ABNORMAL HIGH (ref 70–99)
Potassium: 4 mmol/L (ref 3.5–5.1)
Sodium: 136 mmol/L (ref 135–145)

## 2019-08-30 MED ORDER — POTASSIUM CHLORIDE ER 10 MEQ PO TBCR: 10.0000 meq | EXTENDED_RELEASE_TABLET | Freq: Every day | ORAL | 0 refills | Status: DC

## 2019-08-30 MED ORDER — CLOPIDOGREL BISULFATE 75 MG PO TABS: 75.0000 mg | ORAL_TABLET | Freq: Every day | ORAL | 1 refills | Status: DC

## 2019-08-30 MED ORDER — COLCHICINE 0.6 MG PO TABS: 0.6000 mg | ORAL_TABLET | Freq: Two times a day (BID) | ORAL | 0 refills | Status: DC

## 2019-08-30 MED ORDER — ATORVASTATIN CALCIUM 80 MG PO TABS: 80.0000 mg | ORAL_TABLET | Freq: Every day | ORAL | 1 refills | Status: DC

## 2019-08-30 MED ORDER — ISOSORBIDE DINITRATE 10 MG PO TABS: 10.0000 mg | ORAL_TABLET | Freq: Three times a day (TID) | ORAL | 0 refills | Status: DC

## 2019-08-30 MED ORDER — FUROSEMIDE 40 MG PO TABS
40.0000 mg | ORAL_TABLET | Freq: Every day | ORAL | 0 refills | Status: DC
Start: 2019-08-30 — End: 2019-09-14

## 2019-08-30 MED ORDER — METOPROLOL TARTRATE 50 MG PO TABS: 25.0000 mg | ORAL_TABLET | Freq: Two times a day (BID) | ORAL | 1 refills | Status: DC

## 2019-08-30 MED ORDER — TRAMADOL HCL 50 MG PO TABS: 50.0000 mg | ORAL_TABLET | Freq: Four times a day (QID) | ORAL | 0 refills | Status: AC | PRN

## 2019-08-30 MED ORDER — ASPIRIN 81 MG PO TBEC: 81.0000 mg | DELAYED_RELEASE_TABLET | Freq: Every day | ORAL | Status: AC

## 2019-08-30 NOTE — Progress Notes (Signed)
Discharge orders received. Pt has been seen by cardiac rehab for discharge teaching.  IV and telemetry removed.  CCMD notified.  Discharge instructions reviewed, pt without questions or needs at this time.

## 2019-08-30 NOTE — Progress Notes (Signed)
[id number] Education completed with pt who voiced understanding. Reviewed sternal precautions, IS, walking for exercise, watching carbs and heart healthy food choices, wound care and CRP 2. Referral will be sent to [facility]. Pt offered discharge video but not interested at this time. Pt has been walking without use of walker. Luetta Nutting RN BSN 08/30/2019 9:34 AM

## 2019-08-30 NOTE — Progress Notes (Addendum)
[address]       [place] [address]             [phone]      6 Days Post-Op Procedure(s) (LRB): CORONARY ARTERY BYPASS GRAFTING (CABG), ON PUMP, TIMES FIVE, USING BILATERAL INTERNAL MAMMARY ARTERIES, ENDOSCOPICALLY HARVESTED RIGHT GREATER SAPHENOUS VEIN, AND OPEN LEFT RADIAL ARTERY HARVESTING (N/A) RADIAL ARTERY HARVEST (Left) TRANSESOPHAGEAL ECHOCARDIOGRAM (TEE) (N/A) INDOCYANINE GREEN FLUORESCENCE IMAGING (ICG) (N/A) Subjective: Feels pretty well  Objective: Vital signs in last 24 hours: Temp:  [98 F (36.7 C)-98.6 F (37 C)] 98 F (36.7 C) (07/26 0525) Pulse Rate:  [96-110] 102 (07/26 0525) Cardiac Rhythm: Sinus tachycardia (07/25 1900) Resp:  [15-22] 15 (07/26 0525) BP: (110-131)/(48-66) 129/53 (07/26 0525) SpO2:  [92 %-100 %] 92 % (07/26 0525) Weight:  [65.1 kg] 65.1 kg (07/26 0500)  Hemodynamic parameters for last 24 hours:    Intake/Output from previous day: 07/25 0701 - 07/26 0700 In: 560 [P.O.:560] Out: 0  Intake/Output this shift: No intake/output data recorded.  General appearance: alert, cooperative and no distress Heart: regular rate and rhythm Lungs: dim in bases Abdomen: benign Extremities: + edema Wound: incis healing well, minor numbnes in one small arrea of left hand  Lab Results: Recent Labs    08/28/19 0328  WBC 7.0  HGB 9.5*  HCT 28.6*  PLT 206   BMET:  Recent Labs    08/28/19 0328 08/30/19 0217  NA 139 136  K 3.9 4.0  CL 105 103  CO2 25 22  GLUCOSE 109* 153*  BUN 28* 22  CREATININE 1.49* 1.42*  CALCIUM 8.1* 8.6*    PT/INR: No results for input(s): LABPROT, INR in the last 72 hours. ABG    Component Value Date/Time   PHART 7.338 (L) 08/24/2019 1847   HCO3 22.5 08/24/2019 1847   TCO2 24 08/24/2019 1847   ACIDBASEDEF 3.0 (H) 08/24/2019 1847   O2SAT 99.0 08/24/2019 1847   CBG (last 3)  Recent Labs    08/29/19 1619 08/29/19 2053 08/30/19 0637  GLUCAP 171* 241* 143*    Meds Scheduled  Meds: . sodium chloride   Intravenous Once  . aspirin EC  81 mg Oral Daily  . atorvastatin  80 mg Oral Daily  . bisacodyl  10 mg Oral Daily   Or  . bisacodyl  10 mg Rectal Daily  . Chlorhexidine Gluconate Cloth  6 each Topical Daily  . clopidogrel  75 mg Oral Daily  . colchicine  0.6 mg Oral BID  . desvenlafaxine  100 mg Oral BH-q7a  . docusate sodium  200 mg Oral Daily  . enoxaparin (LOVENOX) injection  40 mg Subcutaneous QHS  . gabapentin  900 mg Oral BH-q7a  . insulin aspart  0-24 Units Subcutaneous TID PC & HS  . insulin detemir  10 Units Subcutaneous Daily  . isosorbide dinitrate  10 mg Oral TID  . mouth rinse  15 mL Mouth Rinse BID  . metoprolol tartrate  25 mg Oral BID  . pantoprazole  40 mg Oral Daily   Continuous Infusions: PRN Meds:.dextrose, metoprolol tartrate, ondansetron (ZOFRAN) IV, oxyCODONE, traMADol  Xrays No results found.  Assessment/Plan: S/P Procedure(s) (LRB): CORONARY ARTERY BYPASS GRAFTING (CABG), ON PUMP, TIMES FIVE, USING BILATERAL INTERNAL MAMMARY ARTERIES, ENDOSCOPICALLY HARVESTED RIGHT GREATER SAPHENOUS VEIN, AND OPEN LEFT RADIAL ARTERY HARVESTING (N/A) RADIAL ARTERY HARVEST (Left) TRANSESOPHAGEAL ECHOCARDIOGRAM (TEE) (N/A) INDOCYANINE GREEN FLUORESCENCE IMAGING (ICG) (N/A)  1 doing well 2 afeb, VSS some sinus  tachy, 1 short episode of Vtach- will increase lopressor to 50 BID 3 sats good on RA 4 labs stable, creat trending down 5 edematous with small effusions.will give short course of lasix 6 stable for d/c  LOS: 10 days    Rowe Clack PA-C Pager [phone] 08/30/2019 Pt seen and examined; agree with PA documentation. Agree with discharge home if CXR ok. Will f/u in one week. Katrine Radich Z. Vickey Sages, MD [phone]

## 2019-08-30 NOTE — Progress Notes (Addendum)
Occupational Therapy Treatment Patient Details Name: Jonathan Holder MRN: [id number] DOB: 1955-04-10 Today's Date: 08/30/2019    History of present illness 64 yo admitted 7/16 for heart cath after going to PCP and having EKG which revealed ST elevation pt with STEMI. Pt s/p CABG x 5 7/20. PMHx: CAD, HTN, DM, HLD   OT comments  Pt reports feeling somewhat "sluggish" today, but is pleasant and willing to participate in therapy session. Much of session focus on review and further education of sternal precautions within context of performing ADL tasks. Pt verbalizing understanding throughout and asking questions appropriately. Pt performing bed mobility at supervision level and with good carryover of precautions. Reports plans to return home with spouse who can also assist with ADL PRN. VSS throughout. Will continue per POC at this time.   Follow Up Recommendations  No OT follow up;Supervision - Intermittent    Equipment Recommendations  3 in 1 bedside commode (to use as shower chair/ over low toilet)          Precautions / Restrictions Precautions Precautions: Sternal       Mobility Bed Mobility Overal bed mobility: Needs Assistance Bed Mobility: Rolling;Supine to Sit Rolling: Modified independent (Device/Increase time)   Supine to sit: Supervision     General bed mobility comments: for safety, pt reports likely will sleep in recliner initially after return home                             ADL either performed or assessed with clinical judgement   ADL Overall ADL's : Needs assistance/impaired Eating/Feeding: Modified independent Eating/Feeding Details (indicate cue type and reason): breakfast tray present end of session               Upper Body Dressing Details (indicate cue type and reason): reviewed techniques for donning overhead shirt  Lower Body Dressing: Moderate assistance;Sit to/from stand Lower Body Dressing Details (indicate cue type and reason): pt  reports spouse to assist with LB ADL           Tub/Shower Transfer Details (indicate cue type and reason): discussed use of shower seat for increased safety and for energy conservation    General ADL Comments: much of session focus on review/education of sternal precautions in relation to functional context; pt verbalizing understanding throughout                        Cognition Arousal/Alertness: Awake/alert Behavior During Therapy: WFL for tasks assessed/performed Overall Cognitive Status: Within Functional Limits for tasks assessed                                          Exercises     Shoulder Instructions       General Comments VSS    Pertinent Vitals/ Pain       Pain Assessment: Faces Faces Pain Scale: Hurts a little bit Pain Location: incisional site Pain Descriptors / Indicators: Discomfort;Sore Pain Intervention(s): Monitored during session;Repositioned  Home Living                                          Prior Functioning/Environment              Frequency  Min  2X/week        Progress Toward Goals  OT Goals(current goals can now be found in the care plan section)  Progress towards OT goals: Progressing toward goals  Acute Rehab OT Goals Patient Stated Goal: return home and to the beach OT Goal Formulation: With patient Time For Goal Achievement: 09/10/19 Potential to Achieve Goals: Good ADL Goals Pt Will Perform Upper Body Bathing: with modified independence;sitting Pt Will Perform Lower Body Bathing: with modified independence;sit to/from stand Pt Will Perform Upper Body Dressing: with modified independence;sitting Pt Will Perform Lower Body Dressing: with modified independence;sit to/from stand Pt Will Transfer to Toilet: with modified independence;ambulating Pt Will Perform Toileting - Clothing Manipulation and hygiene: with modified independence;sit to/from stand;sitting/lateral  leans Additional ADL Goal #1: Pt will independently verbalize 3 sternal precautions for ADL adn mobility  Plan Discharge plan remains appropriate    Co-evaluation                 AM-PAC OT "6 Clicks" Daily Activity     Outcome Measure   Help from another person eating meals?: None Help from another person taking care of personal grooming?: A Little Help from another person toileting, which includes using toliet, bedpan, or urinal?: A Lot Help from another person bathing (including washing, rinsing, drying)?: A Lot Help from another person to put on and taking off regular upper body clothing?: A Lot Help from another person to put on and taking off regular lower body clothing?: A Lot 6 Click Score: 15    End of Session    OT Visit Diagnosis: Unsteadiness on feet (R26.81);Muscle weakness (generalized) (M62.81)   Activity Tolerance Patient tolerated treatment well   Patient Left Other (comment);with call bell/phone within reach (seated EOB)   Nurse Communication Mobility status        Time: 1324-4010 OT Time Calculation (min): 14 min  Charges: OT General Charges $OT Visit: 1 Visit OT Treatments $Self Care/Home Management : 8-22 mins  Marcy Siren, OT Acute Rehabilitation Services Pager [phone] Office [phone]    Jonathan Holder 08/30/2019, 9:16 AM

## 2019-09-03 ENCOUNTER — Other Ambulatory Visit: Payer: Self-pay | Admitting: Cardiothoracic Surgery

## 2019-09-03 DIAGNOSIS — Z951 Presence of aortocoronary bypass graft: Secondary | ICD-10-CM

## 2019-09-06 ENCOUNTER — Ambulatory Visit
Admission: RE | Admit: 2019-09-06 | Discharge: 2019-09-06 | Disposition: A | Discharge status: Home / Self Care | Payer: BC Managed Care – PPO | Source: Ambulatory Visit | Attending: Cardiothoracic Surgery | Admitting: Cardiothoracic Surgery

## 2019-09-06 ENCOUNTER — Ambulatory Visit (INDEPENDENT_AMBULATORY_CARE_PROVIDER_SITE_OTHER): Payer: Self-pay | Admitting: Cardiothoracic Surgery

## 2019-09-06 ENCOUNTER — Other Ambulatory Visit: Payer: Self-pay

## 2019-09-06 VITALS — BP 137/80 | HR 88 | Temp 97.6°F | Resp 20 | Ht 64.0 in | Wt 134.0 lb

## 2019-09-06 DIAGNOSIS — Z951 Presence of aortocoronary bypass graft: Secondary | ICD-10-CM

## 2019-09-06 DIAGNOSIS — J90 Pleural effusion, not elsewhere classified: Secondary | ICD-10-CM | POA: Diagnosis not present

## 2019-09-06 DIAGNOSIS — I2510 Atherosclerotic heart disease of native coronary artery without angina pectoris: Secondary | ICD-10-CM

## 2019-09-06 DIAGNOSIS — J9811 Atelectasis: Secondary | ICD-10-CM | POA: Diagnosis not present

## 2019-09-06 NOTE — Progress Notes (Signed)
[address]       [place] [address]             [phone]     CARDIOTHORACIC SURGERY OFFICE NOTE  Referring Provider is Marykay Lex, MD Primary Cardiologist is Gypsy Balsam, MD PCP is Patient, No Pcp Per   HPI:  64 year old man returns for initial postoperative visit status post CABG x5.  He was discharged approximately 1 week after surgery and has been doing well at home.  He has no complaints of shortness of breath or chest pain.  Denies fevers and chills   Current Outpatient Medications  Medication Sig Dispense Refill   aspirin EC 81 MG EC tablet Take 1 tablet (81 mg total) by mouth daily. Swallow whole.     atorvastatin (LIPITOR) 80 MG tablet Take 1 tablet (80 mg total) by mouth daily. 30 tablet 1   clopidogrel (PLAVIX) 75 MG tablet Take 1 tablet (75 mg total) by mouth daily. 30 tablet 1   colchicine 0.6 MG tablet Take 1 tablet (0.6 mg total) by mouth 2 (two) times daily. 60 tablet 0   desvenlafaxine (PRISTIQ) 100 MG 24 hr tablet Take 100 mg by mouth every evening.      famotidine (PEPCID) 40 MG tablet Take 40 mg by mouth daily.     Ferrous Sulfate (SLOW FE PO) Take 1 tablet by mouth every other day.     furosemide (LASIX) 40 MG tablet Take 1 tablet (40 mg total) by mouth daily. 7 tablet 0   gabapentin (NEURONTIN) 300 MG capsule Take 900 mg by mouth every evening.      insulin aspart (NOVOLOG) 100 UNIT/ML injection Inject 3-15 Units into the skin 3 (three) times daily before meals.     Insulin Degludec (TRESIBA) 100 UNIT/ML SOLN Inject 20 Units into the skin daily.     isosorbide dinitrate (ISORDIL) 10 MG tablet Take 1 tablet (10 mg total) by mouth 3 (three) times daily. 90 tablet 0   metoprolol tartrate (LOPRESSOR) 50 MG tablet Take 0.5 tablets (25 mg total) by mouth 2 (two) times daily. 60 tablet 1   Multiple Vitamins-Minerals (OCUVITE PRESERVISION PO) Take 1 tablet by mouth in the morning and at bedtime.     potassium  chloride (KLOR-CON) 10 MEQ tablet Take 1 tablet (10 mEq total) by mouth daily. 7 tablet 0   traMADol (ULTRAM) 50 MG tablet Take 1 tablet (50 mg total) by mouth every 6 (six) hours as needed for up to 7 days for moderate pain. 28 tablet 0   VRAYLAR capsule Take 3 mg by mouth at bedtime.     No current facility-administered medications for this visit.      Physical Exam:   BP (!) 137/80    Pulse 88    Temp 97.6 F (36.4 C) (Skin)    Resp 20    Ht 5\' 4"  (1.626 m)    Wt 60.8 kg    SpO2 100% Comment: RA   BMI 23.00 kg/m   General:  Well appearing no acute distress  Chest:   Clear to auscultation  CV:   Regular rate and rhythm  Incisions:  Well-healed, staples are removed from the left forearm incision  Neuro:   Intact  Extremities:  Mild edema right lower extremity  Diagnostic Tests:  PA and lateral chest x-ray with clear lung fields   Impression:  Doing well after CABG x5  Plan:  Follow-up in 2 weeks for wound  check Okay to hold Lasix and potassium Stop colchicine  I spent in excess of 20 minutes during the conduct of this office consultation and >50% of this time involved direct face-to-face encounter with the patient for counseling and/or coordination of their care.  Level 2                 10 minutes Level 3                 15 minutes Level 4                 25 minutes Level 5                 40 minutes  B. Lorayne Marek, MD 09/06/2019 11:20 AM

## 2019-09-14 ENCOUNTER — Ambulatory Visit: Payer: BC Managed Care – PPO | Admitting: Cardiology

## 2019-09-14 ENCOUNTER — Encounter: Payer: Self-pay | Admitting: Cardiology

## 2019-09-14 ENCOUNTER — Other Ambulatory Visit: Payer: Self-pay

## 2019-09-14 VITALS — BP 136/80 | HR 90 | Ht 64.0 in | Wt 131.4 lb

## 2019-09-14 DIAGNOSIS — Z951 Presence of aortocoronary bypass graft: Secondary | ICD-10-CM | POA: Diagnosis not present

## 2019-09-14 DIAGNOSIS — E1169 Type 2 diabetes mellitus with other specified complication: Secondary | ICD-10-CM

## 2019-09-14 DIAGNOSIS — I253 Aneurysm of heart: Secondary | ICD-10-CM

## 2019-09-14 DIAGNOSIS — E785 Hyperlipidemia, unspecified: Secondary | ICD-10-CM

## 2019-09-14 DIAGNOSIS — I10 Essential (primary) hypertension: Secondary | ICD-10-CM

## 2019-09-14 DIAGNOSIS — E118 Type 2 diabetes mellitus with unspecified complications: Secondary | ICD-10-CM

## 2019-09-14 DIAGNOSIS — Z794 Long term (current) use of insulin: Secondary | ICD-10-CM

## 2019-09-14 NOTE — Progress Notes (Signed)
bmp 

## 2019-09-14 NOTE — Progress Notes (Signed)
Cardiology Office Note:    Date:  09/14/2019   ID:  Jonathan Holder, DOB [birth year], MRN [id number]  PCP:  Noni Saupe, MD  Cardiologist:  Gypsy Balsam, MD    Referring MD: No ref. provider found   No chief complaint on file. I am still weak and tired  History of Present Illness:    Jonathan Holder is a 64 y.o. male he came to round of hospital because of atypical chest pain.  He was find to have significant hypokinesis with aneurysm of the left ventricle, he was transferred to [facility], cardiac catheterization has been done, catheterization showed advanced coronary artery disease with 95% mid LAD, there was also sequential 70% 80% 90% intermediate branch disease, there was 80% stenosis of the circumflex artery as well as 70% of right coronary artery.  His ejection fraction at that time was 25 to 30%.  He ended up having coronary artery bypass graft with 5 grafts.  He comes today to my office for follow-up.  He overall complain of having fatigue and tiredness however he wants to go back to work.  Told him not yet.  Denies have any chest pain tightness squeezing pressure burning chest.  He does not need to take any pain medication for his chest soreness.  Denies having any shortness of breath.  Does have minimal swelling of lower extremities.  Overall I would consider his recovery is good. Past Medical History:  Diagnosis Date  . Anxiety   . Arthritis   . Depression   . Diabetes mellitus without complication (HCC)   . History of kidney stones   . Hypertension     Past Surgical History:  Procedure Laterality Date  . ANTERIOR CERVICAL DECOMP/DISCECTOMY FUSION N/A 07/31/2016   Procedure: ANTERIOR CERVICAL DECOMPRESSION/DISCECTOMY FUSION CERVICAL FOUR- CERVICAL FIVE, CERVICAL FIVE- CERVICAL SIX, CERVICAL SIX, CERVICAL SEVEN;  Surgeon: Shirlean Kelly, MD;  Location: [facility] OR;  Service: Neurosurgery;  Laterality: N/A;  . CORONARY ARTERY BYPASS GRAFT N/A 08/24/2019   Procedure: CORONARY  ARTERY BYPASS GRAFTING (CABG), ON PUMP, TIMES FIVE, USING BILATERAL INTERNAL MAMMARY ARTERIES, ENDOSCOPICALLY HARVESTED RIGHT GREATER SAPHENOUS VEIN, AND OPEN LEFT RADIAL ARTERY HARVESTING;  Surgeon: Linden Dolin, MD;  Location: [facility] OR;  Service: Open Heart Surgery;  Laterality: N/A;  . DG THUMB RIGHT HAND ([facility] HX) Left    plate  . EYE SURGERY Bilateral    cataract removed  . LEFT HEART CATH AND CORONARY ANGIOGRAPHY N/A 08/20/2019   Procedure: LEFT HEART CATH AND CORONARY ANGIOGRAPHY;  Surgeon: Marykay Lex, MD;  Location: [facility] INVASIVE CV LAB;  Service: Cardiovascular;  Laterality: N/A;  . PENILE PROSTHESIS IMPLANT    . RADIAL ARTERY HARVEST Left 08/24/2019   Procedure: RADIAL ARTERY HARVEST;  Surgeon: Linden Dolin, MD;  Location: [facility] OR;  Service: Open Heart Surgery;  Laterality: Left;  . TEE WITHOUT CARDIOVERSION N/A 08/24/2019   Procedure: TRANSESOPHAGEAL ECHOCARDIOGRAM (TEE);  Surgeon: Linden Dolin, MD;  Location: [facility] OR;  Service: Open Heart Surgery;  Laterality: N/A;    Current Medications: Current Meds  Medication Sig  . aspirin EC 81 MG EC tablet Take 1 tablet (81 mg total) by mouth daily. Swallow whole.  Marland Kitchen atorvastatin (LIPITOR) 80 MG tablet Take 1 tablet (80 mg total) by mouth daily.  . clopidogrel (PLAVIX) 75 MG tablet Take 1 tablet (75 mg total) by mouth daily.  . colchicine 0.6 MG tablet Take 1 tablet (0.6 mg total) by mouth 2 (two) times daily.  Marland Kitchen  desvenlafaxine (PRISTIQ) 100 MG 24 hr tablet Take 100 mg by mouth every evening.   . Ferrous Sulfate (SLOW FE PO) Take 1 tablet by mouth every other day.  . gabapentin (NEURONTIN) 300 MG capsule Take 900 mg by mouth every evening.   . insulin aspart (NOVOLOG) 100 UNIT/ML injection Inject 3-15 Units into the skin 3 (three) times daily before meals.  . Insulin Degludec (TRESIBA) 100 UNIT/ML SOLN Inject 20 Units into the skin daily.  . isosorbide dinitrate (ISORDIL) 10 MG tablet Take 1 tablet (10 mg total) by mouth 3  (three) times daily.  . metoprolol tartrate (LOPRESSOR) 50 MG tablet Take 0.5 tablets (25 mg total) by mouth 2 (two) times daily.  . Multiple Vitamins-Minerals (OCUVITE PRESERVISION PO) Take 1 tablet by mouth in the morning and at bedtime.  Marland Kitchen VRAYLAR capsule Take 3 mg by mouth at bedtime.     Allergies:   Patient has no known allergies.   Social History   Socioeconomic History  . Marital status: Married    Spouse name: Not on file  . Number of children: Not on file  . Years of education: Not on file  . Highest education level: Not on file  Occupational History  . Not on file  Tobacco Use  . Smoking status: Never Smoker  . Smokeless tobacco: Never Used  Vaping Use  . Vaping Use: Never used  Substance and Sexual Activity  . Alcohol use: No  . Drug use: No  . Sexual activity: Yes  Other Topics Concern  . Not on file  Social History Narrative   Works for [employer] business   Social Determinants of Health   Financial Resource Strain:   . Difficulty of Paying Living Expenses:   Food Insecurity:   . Worried About Programme researcher, broadcasting/film/video in the Last Year:   . Barista in the Last Year:   Transportation Needs:   . Freight forwarder (Medical):   Marland Kitchen Lack of Transportation (Non-Medical):   Physical Activity:   . Days of Exercise per Week:   . Minutes of Exercise per Session:   Stress:   . Feeling of Stress :   Social Connections:   . Frequency of Communication with Friends and Family:   . Frequency of Social Gatherings with Friends and Family:   . Attends Religious Services:   . Active Member of Clubs or Organizations:   . Attends Banker Meetings:   Marland Kitchen Marital Status:      Family History: The patient's family history is not on file. ROS:   Please see the history of present illness.    All 14 point review of systems negative except as described per history of present illness  EKGs/Labs/Other Studies Reviewed:      Recent  Labs: 08/21/2019: ALT 9; B Natriuretic Peptide 513.2 08/25/2019: Magnesium 2.1 08/28/2019: Hemoglobin 9.5; Platelets 206 08/30/2019: BUN 22; Creatinine, Ser 1.42; Potassium 4.0; Sodium 136  Recent Lipid Panel    Component Value Date/Time   CHOL 86 08/21/2019 0757   TRIG 40 08/21/2019 0757   HDL 30 (L) 08/21/2019 0757   CHOLHDL 2.9 08/21/2019 0757   VLDL 8 08/21/2019 0757   LDLCALC 48 08/21/2019 0757    Physical Exam:    VS:  BP 136/80   Pulse 90   Ht 5\' 4"  (1.626 m)   Wt 131 lb 6.4 oz (59.6 kg)   SpO2 97%   BMI 22.55 kg/m     Wt  Readings from Last 3 Encounters:  09/14/19 131 lb 6.4 oz (59.6 kg)  09/06/19 134 lb (60.8 kg)  08/30/19 143 lb 9.6 oz (65.1 kg)     GEN:  Well nourished, well developed in no acute distress HEENT: Normal NECK: No JVD; No carotid bruits LYMPHATICS: No lymphadenopathy CARDIAC: RRR, no murmurs, no rubs, no gallops RESPIRATORY:  Clear to auscultation without rales, wheezing or rhonchi  ABDOMEN: Soft, non-tender, non-distended MUSCULOSKELETAL:  No edema; No deformity  SKIN: Warm and dry LOWER EXTREMITIES: no swelling NEUROLOGIC:  Alert and oriented x 3 PSYCHIATRIC:  Normal affect   ASSESSMENT:    1. Essential hypertension   2. 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 S/P CABG x 5   4. Hyperlipidemia associated with type 2 diabetes mellitus (HCC)   5. Type 2 diabetes mellitus with complication, with long-term current use of insulin (HCC)    PLAN:    In order of problems listed above:  1. Coronary artery disease status post coronary bypass graft 3 weeks ago.  Recovering nicely after surgery.  Wounds are healing.  Will continue present management.  He is on dual antiplatelet therapy as well as high intensity statin which I will continue. 2. Left ventricle aneurysm.  I will ask him to have a Chem-7 today done.  Based on that will decide if we can start him on ARB and favorable on Entresto.  Obviously within next  few months we will repeat echocardiogram. 3. Dyslipidemia: He is on high intensity statin form of Lipitor 80 which I will continue.  We will repeat fasting lipid profile when I see him next time. 4. Type 2 diabetes.  Followed by internal medicine team.  Overall he does have advanced coronary disease.  That being addressed with coronary bypass graft.  We will follow him very quickly within the next 2 to 3 weeks.   Medication Adjustments/Labs and Tests Ordered: Current medicines are reviewed at length with the patient today.  Concerns regarding medicines are outlined above.  Orders Placed This Encounter  Procedures  . Basic metabolic panel  . CBC   Medication changes: No orders of the defined types were placed in this encounter.   Signed, Georgeanna Lea, MD, [facility] 09/14/2019 4:41 PM    [facility]

## 2019-09-14 NOTE — Patient Instructions (Signed)
Medication Instructions:  Your physician recommends that you continue on your current medications as directed. Please refer to the Current Medication list given to you today.  *If you need a refill on your cardiac medications before your next appointment, please call your pharmacy*   Lab Work: Your physician recommends that you return for lab work today: bmp, cbc  If you have labs (blood work) drawn today and your tests are completely normal, you will receive your results only by: . MyChart Message (if you have MyChart) OR . A paper copy in the mail If you have any lab test that is abnormal or we need to change your treatment, we will call you to review the results.   Testing/Procedures: None   Follow-Up: At [facility], you and your health needs are our priority.  As part of our continuing mission to provide you with exceptional heart care, we have created designated Provider Care Teams.  These Care Teams include your primary Cardiologist (physician) and Advanced Practice Providers (APPs -  Physician Assistants and Nurse Practitioners) who all work together to provide you with the care you need, when you need it.  We recommend signing up for the patient portal called "MyChart".  Sign up information is provided on this After Visit Summary.  MyChart is used to connect with patients for Virtual Visits (Telemedicine).  Patients are able to view lab/test results, encounter notes, upcoming appointments, etc.  Non-urgent messages can be sent to your provider as well.   To learn more about what you can do with MyChart, go to [email or web address].    Your next appointment:   2 week(s)  The format for your next appointment:   In Person  Provider:   Robert Krasowski, MD   Other Instructions   

## 2019-09-15 LAB — CBC
Hematocrit: 40.8 % (ref 37.5–51.0)
Hemoglobin: 12.8 g/dL — ABNORMAL LOW (ref 13.0–17.7)
MCH: 29.4 pg (ref 26.6–33.0)
MCHC: 31.4 g/dL — ABNORMAL LOW (ref 31.5–35.7)
MCV: 94 fL (ref 79–97)
Platelets: 379 10*3/uL (ref 150–450)
RBC: 4.36 x10E6/uL (ref 4.14–5.80)
RDW: 13.3 % (ref 11.6–15.4)
WBC: 9.3 10*3/uL (ref 3.4–10.8)

## 2019-09-15 LAB — BASIC METABOLIC PANEL
BUN/Creatinine Ratio: 18 (ref 10–24)
BUN: 27 mg/dL (ref 8–27)
CO2: 27 mmol/L (ref 20–29)
Calcium: 9.9 mg/dL (ref 8.6–10.2)
Chloride: 100 mmol/L (ref 96–106)
Creatinine, Ser: 1.51 mg/dL — ABNORMAL HIGH (ref 0.76–1.27)
GFR calc Af Amer: 56 mL/min/{1.73_m2} — ABNORMAL LOW (ref >59)
GFR calc non Af Amer: 48 mL/min/{1.73_m2} — ABNORMAL LOW (ref >59)
Glucose: 262 mg/dL — ABNORMAL HIGH (ref 65–99)
Potassium: 5.7 mmol/L — ABNORMAL HIGH (ref 3.5–5.2)
Sodium: 141 mmol/L (ref 134–144)

## 2019-09-16 DIAGNOSIS — G4733 Obstructive sleep apnea (adult) (pediatric): Secondary | ICD-10-CM | POA: Diagnosis not present

## 2019-09-17 ENCOUNTER — Telehealth: Payer: Self-pay | Admitting: Emergency Medicine

## 2019-09-17 MED ORDER — METOPROLOL SUCCINATE ER 25 MG PO TB24
25.0000 mg | ORAL_TABLET | Freq: Every day | ORAL | 1 refills | Status: DC
Start: 2019-09-17 — End: 2020-04-11

## 2019-09-17 NOTE — Telephone Encounter (Signed)
Called patient informed him of lab results and to stop metoprolol tartrate and start metoprolol succinate 25 mg daily. Patient verbally understood. No further questions.

## 2019-09-20 ENCOUNTER — Ambulatory Visit: Payer: Self-pay | Admitting: Cardiothoracic Surgery

## 2019-09-20 ENCOUNTER — Other Ambulatory Visit: Payer: Self-pay

## 2019-09-20 VITALS — BP 121/43 | HR 67 | Temp 97.6°F | Resp 20 | Ht 64.0 in | Wt 128.0 lb

## 2019-09-20 DIAGNOSIS — Z951 Presence of aortocoronary bypass graft: Secondary | ICD-10-CM

## 2019-09-20 DIAGNOSIS — I2510 Atherosclerotic heart disease of native coronary artery without angina pectoris: Secondary | ICD-10-CM

## 2019-09-21 ENCOUNTER — Telehealth: Payer: Self-pay | Admitting: Cardiology

## 2019-09-21 NOTE — Telephone Encounter (Signed)
Patient's wife is calling to inquire about a referral for cardiac rehab in [place], [place]. The rehab office in [place], [place] contacted the patient this morning. However, she states the patient was never notified that he was being referred. Please return call to discuss.

## 2019-09-21 NOTE — Telephone Encounter (Signed)
Called patient. Informed him that this order for cardiac rehab was placed by Dr. Vickey Sages and he would need to check with him as to why the referral was placed. He verbally understood. No further questions.

## 2019-09-21 NOTE — Progress Notes (Signed)
[address]       [place] [address]             [phone]     CARDIOTHORACIC SURGERY OFFICE NOTE  Referring Provider is Marykay Lex, MD Primary Cardiologist is Gypsy Balsam, MD PCP is Noni Saupe, MD   HPI:  64 yo man s/p CABG on 7/20; did well and was discharged after several days. Has been doing well at home; presents for routine f/u.  Denies chest pain or SOB Has a relatively poor appetite   Current Outpatient Medications  Medication Sig Dispense Refill  . aspirin EC 81 MG EC tablet Take 1 tablet (81 mg total) by mouth daily. Swallow whole.    Marland Kitchen atorvastatin (LIPITOR) 80 MG tablet Take 1 tablet (80 mg total) by mouth daily. 30 tablet 1  . clopidogrel (PLAVIX) 75 MG tablet Take 1 tablet (75 mg total) by mouth daily. 30 tablet 1  . colchicine 0.6 MG tablet Take 1 tablet (0.6 mg total) by mouth 2 (two) times daily. 60 tablet 0  . desvenlafaxine (PRISTIQ) 100 MG 24 hr tablet Take 100 mg by mouth every evening.     . Ferrous Sulfate (SLOW FE PO) Take 1 tablet by mouth every other day.    . gabapentin (NEURONTIN) 300 MG capsule Take 900 mg by mouth every evening.     . insulin aspart (NOVOLOG) 100 UNIT/ML injection Inject 3-15 Units into the skin 3 (three) times daily before meals.    . Insulin Degludec (TRESIBA) 100 UNIT/ML SOLN Inject 20 Units into the skin daily.    . isosorbide dinitrate (ISORDIL) 10 MG tablet Take 1 tablet (10 mg total) by mouth 3 (three) times daily. 90 tablet 0  . metoprolol succinate (TOPROL-XL) 25 MG 24 hr tablet Take 1 tablet (25 mg total) by mouth daily. Take with or immediately following a meal. 90 tablet 1  . Multiple Vitamins-Minerals (OCUVITE PRESERVISION PO) Take 1 tablet by mouth in the morning and at bedtime.    Marland Kitchen VRAYLAR capsule Take 3 mg by mouth at bedtime.     No current facility-administered medications for this visit.      Physical Exam:   BP (!) 121/43   Pulse 67   Temp 97.6 F (36.4 C)  (Skin)   Resp 20   Ht 5\' 4"  (1.626 m)   Wt 58.1 kg   SpO2 98% Comment: RA  BMI 21.97 kg/m   General:  Well-appearing, NAD  Chest:   cta  CV:   rrr  Incisions:  C/d/i  Abdomen:  sntnd  Extremities:  No edema  Diagnostic Tests:  No new testing   Impression:  Doing well after CABG  Plan:  F/u as needed Encourage increased enteral intake Ok to drive and liberalize activity with upper extremities  I spent in excess of 20 minutes during the conduct of this office consultation and >50% of this time involved direct face-to-face encounter with the patient for counseling and/or coordination of their care.  Level 2                 10 minutes Level 3                 15 minutes Level 4                 25 minutes Level 5  40 minutes  B. Lorayne Marek, MD 09/21/2019 7:45 AM

## 2019-09-28 ENCOUNTER — Ambulatory Visit: Payer: BC Managed Care – PPO | Admitting: Cardiology

## 2019-09-28 ENCOUNTER — Encounter: Payer: Self-pay | Admitting: Cardiology

## 2019-09-28 ENCOUNTER — Other Ambulatory Visit: Payer: Self-pay

## 2019-09-28 VITALS — BP 112/60 | HR 85 | Ht 64.0 in | Wt 130.0 lb

## 2019-09-28 DIAGNOSIS — E118 Type 2 diabetes mellitus with unspecified complications: Secondary | ICD-10-CM

## 2019-09-28 DIAGNOSIS — I2510 Atherosclerotic heart disease of native coronary artery without angina pectoris: Secondary | ICD-10-CM

## 2019-09-28 DIAGNOSIS — I10 Essential (primary) hypertension: Secondary | ICD-10-CM | POA: Diagnosis not present

## 2019-09-28 DIAGNOSIS — R0989 Other specified symptoms and signs involving the circulatory and respiratory systems: Secondary | ICD-10-CM

## 2019-09-28 DIAGNOSIS — Z951 Presence of aortocoronary bypass graft: Secondary | ICD-10-CM | POA: Diagnosis not present

## 2019-09-28 DIAGNOSIS — R931 Abnormal findings on diagnostic imaging of heart and coronary circulation: Secondary | ICD-10-CM

## 2019-09-28 DIAGNOSIS — E785 Hyperlipidemia, unspecified: Secondary | ICD-10-CM

## 2019-09-28 DIAGNOSIS — Z794 Long term (current) use of insulin: Secondary | ICD-10-CM

## 2019-09-28 DIAGNOSIS — E1169 Type 2 diabetes mellitus with other specified complication: Secondary | ICD-10-CM | POA: Diagnosis not present

## 2019-09-28 NOTE — Addendum Note (Signed)
Addended by: Vernard Gambles on: 09/28/2019 03:31 PM   Modules accepted: Orders

## 2019-09-28 NOTE — Progress Notes (Signed)
Cardiology Office Note:    Date:  09/28/2019   ID:  Jonathan Holder, DOB 19-Oct-1955, MRN [id number]  PCP:  Noni Saupe, MD  Cardiologist:  Gypsy Balsam, MD    Referring MD: Noni Saupe, MD   No chief complaint on file. I am doing much better  History of Present Illness:    Jonathan Holder is a 64 y.o. male with past medical history significant for coronary artery disease.  He came to [facility] with episode of chest pain that he was transferred to [facility], cardiac catheterization was done which showed 90% mid LAD 90% intermediate branch 80% stenosis of the circumflex artery and 70% of right coronary artery.  He was found to have ejection fraction 25 to 30%.  He end up having coronary artery bypass graft with 5 grafts.  He comes today 2 months for follow-up overall he is doing much better.  He is feeling stronger and does not have pain there is no swelling of lower extremities.  Overall he is improving.  Past Medical History:  Diagnosis Date  . Anxiety   . Arthritis   . Depression   . Diabetes mellitus without complication (HCC)   . History of kidney stones   . Hypertension     Past Surgical History:  Procedure Laterality Date  . ANTERIOR CERVICAL DECOMP/DISCECTOMY FUSION N/A 07/31/2016   Procedure: ANTERIOR CERVICAL DECOMPRESSION/DISCECTOMY FUSION CERVICAL FOUR- CERVICAL FIVE, CERVICAL FIVE- CERVICAL SIX, CERVICAL SIX, CERVICAL SEVEN;  Surgeon: Shirlean Kelly, MD;  Location: [facility] OR;  Service: Neurosurgery;  Laterality: N/A;  . CORONARY ARTERY BYPASS GRAFT N/A 08/24/2019   Procedure: CORONARY ARTERY BYPASS GRAFTING (CABG), ON PUMP, TIMES FIVE, USING BILATERAL INTERNAL MAMMARY ARTERIES, ENDOSCOPICALLY HARVESTED RIGHT GREATER SAPHENOUS VEIN, AND OPEN LEFT RADIAL ARTERY HARVESTING;  Surgeon: Linden Dolin, MD;  Location: [facility] OR;  Service: Open Heart Surgery;  Laterality: N/A;  . DG THUMB RIGHT HAND ([facility] HX) Left    plate  . EYE SURGERY Bilateral    cataract  removed  . LEFT HEART CATH AND CORONARY ANGIOGRAPHY N/A 08/20/2019   Procedure: LEFT HEART CATH AND CORONARY ANGIOGRAPHY;  Surgeon: Marykay Lex, MD;  Location: [facility] INVASIVE CV LAB;  Service: Cardiovascular;  Laterality: N/A;  . PENILE PROSTHESIS IMPLANT    . RADIAL ARTERY HARVEST Left 08/24/2019   Procedure: RADIAL ARTERY HARVEST;  Surgeon: Linden Dolin, MD;  Location: [facility] OR;  Service: Open Heart Surgery;  Laterality: Left;  . TEE WITHOUT CARDIOVERSION N/A 08/24/2019   Procedure: TRANSESOPHAGEAL ECHOCARDIOGRAM (TEE);  Surgeon: Linden Dolin, MD;  Location: [facility] OR;  Service: Open Heart Surgery;  Laterality: N/A;    Current Medications: Current Meds  Medication Sig  . aspirin EC 81 MG EC tablet Take 1 tablet (81 mg total) by mouth daily. Swallow whole.  Marland Kitchen atorvastatin (LIPITOR) 80 MG tablet Take 1 tablet (80 mg total) by mouth daily.  . clopidogrel (PLAVIX) 75 MG tablet Take 1 tablet (75 mg total) by mouth daily.  Marland Kitchen desvenlafaxine (PRISTIQ) 100 MG 24 hr tablet Take 100 mg by mouth every evening.   . famotidine (PEPCID) 40 MG tablet Take 40 mg by mouth daily.  Marland Kitchen gabapentin (NEURONTIN) 300 MG capsule Take 900 mg by mouth every evening.   . insulin aspart (NOVOLOG) 100 UNIT/ML injection Inject 3-15 Units into the skin 3 (three) times daily before meals.  . Insulin Degludec (TRESIBA) 100 UNIT/ML SOLN Inject 20 Units into the skin daily.  . metoprolol succinate (  TOPROL-XL) 25 MG 24 hr tablet Take 1 tablet (25 mg total) by mouth daily. Take with or immediately following a meal.  . VRAYLAR capsule Take 3 mg by mouth at bedtime.     Allergies:   Patient has no known allergies.   Social History   Socioeconomic History  . Marital status: Married    Spouse name: Not on file  . Number of children: Not on file  . Years of education: Not on file  . Highest education level: Not on file  Occupational History  . Not on file  Tobacco Use  . Smoking status: Never Smoker  . Smokeless  tobacco: Never Used  Vaping Use  . Vaping Use: Never used  Substance and Sexual Activity  . Alcohol use: No  . Drug use: No  . Sexual activity: Yes  Other Topics Concern  . Not on file  Social History Narrative   Works for [employer] business   Social Determinants of Health   Financial Resource Strain:   . Difficulty of Paying Living Expenses: Not on file  Food Insecurity:   . Worried About Programme researcher, broadcasting/film/video in the Last Year: Not on file  . Ran Out of Food in the Last Year: Not on file  Transportation Needs:   . Lack of Transportation (Medical): Not on file  . Lack of Transportation (Non-Medical): Not on file  Physical Activity:   . Days of Exercise per Week: Not on file  . Minutes of Exercise per Session: Not on file  Stress:   . Feeling of Stress : Not on file  Social Connections:   . Frequency of Communication with Friends and Family: Not on file  . Frequency of Social Gatherings with Friends and Family: Not on file  . Attends Religious Services: Not on file  . Active Member of Clubs or Organizations: Not on file  . Attends Banker Meetings: Not on file  . Marital Status: Not on file     Family History: The patient's family history is not on file. ROS:   Please see the history of present illness.    All 14 point review of systems negative except as described per history of present illness  EKGs/Labs/Other Studies Reviewed:      Recent Labs: 08/21/2019: ALT 9; B Natriuretic Peptide 513.2 08/25/2019: Magnesium 2.1 09/14/2019: BUN 27; Creatinine, Ser 1.51; Hemoglobin 12.8; Platelets 379; Potassium 5.7; Sodium 141  Recent Lipid Panel    Component Value Date/Time   CHOL 86 08/21/2019 0757   TRIG 40 08/21/2019 0757   HDL 30 (L) 08/21/2019 0757   CHOLHDL 2.9 08/21/2019 0757   VLDL 8 08/21/2019 0757   LDLCALC 48 08/21/2019 0757    Physical Exam:    VS:  BP 112/60   Pulse 85   Ht 5\' 4"  (1.626 m)   Wt 130 lb (59 kg)   SpO2 97%   BMI  22.31 kg/m     Wt Readings from Last 3 Encounters:  09/28/19 130 lb (59 kg)  09/20/19 128 lb (58.1 kg)  09/14/19 131 lb 6.4 oz (59.6 kg)     GEN:  Well nourished, well developed in no acute distress HEENT: Normal NECK: No JVD; No carotid bruits LYMPHATICS: No lymphadenopathy CARDIAC: RRR, no murmurs, no rubs, no gallops RESPIRATORY:  Clear to auscultation without rales, wheezing or rhonchi  ABDOMEN: Soft, non-tender, non-distended MUSCULOSKELETAL:  No edema; No deformity  SKIN: Warm and dry LOWER EXTREMITIES: no swelling NEUROLOGIC:  Alert  and oriented x 3 PSYCHIATRIC:  Normal affect   ASSESSMENT:    1. 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 Hyperlipidemia associated with type 2 diabetes mellitus (HCC)   3. Type 2 diabetes mellitus with complication, with long-term current use of insulin (HCC)   4. Essential hypertension   5. Multi-vessel coronary artery stenosis    PLAN:    In order of problems listed above:  1. Coronary disease status post coronary bypass graft x5 done in July 2021.  I will refer him to rehab.  Apparently: Many times that he has no answer.  I called him myself and he will be scheduled to see rehab.  None overall doing well from cardiac standpoint of view. 2. Dyslipidemia: He is on high intensity statin.  He did have fasting lipid profile repeated which showed LDL of 48 and HDL of 30.  This is from 08/21/2019 this is K PN.  We will continue aggressive management with high intensity statin. 3. Essential hypertension his blood pressures well controlled we will continue present management. 4. Cardiomyopathy with significantly reduced left ventricular ejection fraction.  Sadly am not able to put him on ACE inhibitor or ARB or Entresto secondary to kidney dysfunction.  We will continue monitoring the situation.  He is on Imdur beta-blocker which I will continue for now.  Within next few months we will repeat echocardiogram to recheck left ventricle ejection  fraction.   Medication Adjustments/Labs and Tests Ordered: Current medicines are reviewed at length with the patient today.  Concerns regarding medicines are outlined above.  No orders of the defined types were placed in this encounter.  Medication changes: No orders of the defined types were placed in this encounter.   Signed, Georgeanna Lea, MD, [facility] 09/28/2019 3:20 PM    [facility]

## 2019-09-28 NOTE — Patient Instructions (Signed)
Medication Instructions:  Your physician recommends that you continue on your current medications as directed. Please refer to the Current Medication list given to you today.  *If you need a refill on your cardiac medications before your next appointment, please call your pharmacy*   Lab Work: None ordered   If you have labs (blood work) drawn today and your tests are completely normal, you will receive your results only by: MyChart Message (if you have MyChart) OR A paper copy in the mail If you have any lab test that is abnormal or we need to change your treatment, we will call you to review the results.   Testing/Procedures: Your physician has requested that you have an echocardiogram. Echocardiography is a painless test that uses sound waves to create images of your heart. It provides your doctor with information about the size and shape of your heart and how well your heart's chambers and valves are working. This procedure takes approximately one hour. There are no restrictions for this procedure.    Follow-Up: At [facility], you and your health needs are our priority.  As part of our continuing mission to provide you with exceptional heart care, we have created designated Provider Care Teams.  These Care Teams include your primary Cardiologist (physician) and Advanced Practice Providers (APPs -  Physician Assistants and Nurse Practitioners) who all work together to provide you with the care you need, when you need it.  We recommend signing up for the patient portal called "MyChart".  Sign up information is provided on this After Visit Summary.  MyChart is used to connect with patients for Virtual Visits (Telemedicine).  Patients are able to view lab/test results, encounter notes, upcoming appointments, etc.  Non-urgent messages can be sent to your provider as well.   To learn more about what you can do with MyChart, go to [email or web address]://[email or web address].com.    Your next appointment:   3  month(s)  The format for your next appointment:   In Person  Provider:   Robert Krasowski, MD   Other Instructions None   

## 2019-10-05 DIAGNOSIS — G4733 Obstructive sleep apnea (adult) (pediatric): Secondary | ICD-10-CM | POA: Diagnosis not present

## 2019-10-05 DIAGNOSIS — R400 Somnolence: Secondary | ICD-10-CM | POA: Diagnosis not present

## 2019-10-05 DIAGNOSIS — R5383 Other fatigue: Secondary | ICD-10-CM | POA: Diagnosis not present

## 2019-10-12 ENCOUNTER — Telehealth: Payer: Self-pay | Admitting: Cardiology

## 2019-10-12 NOTE — Telephone Encounter (Signed)
Jonathan Holder is calling requesting a copy of his current medications be left at the front office for him to come pick up tomorrow. Please advise.

## 2019-10-12 NOTE — Telephone Encounter (Signed)
Called patient advised him list will be ready and front desk. He verbally understood. No further questions.

## 2019-10-13 DIAGNOSIS — Z951 Presence of aortocoronary bypass graft: Secondary | ICD-10-CM | POA: Diagnosis not present

## 2019-10-13 DIAGNOSIS — Z7902 Long term (current) use of antithrombotics/antiplatelets: Secondary | ICD-10-CM | POA: Diagnosis not present

## 2019-10-13 DIAGNOSIS — E108 Type 1 diabetes mellitus with unspecified complications: Secondary | ICD-10-CM | POA: Diagnosis not present

## 2019-10-13 DIAGNOSIS — Z79899 Other long term (current) drug therapy: Secondary | ICD-10-CM | POA: Diagnosis not present

## 2019-10-13 DIAGNOSIS — Z7982 Long term (current) use of aspirin: Secondary | ICD-10-CM | POA: Diagnosis not present

## 2019-10-13 DIAGNOSIS — Z955 Presence of coronary angioplasty implant and graft: Secondary | ICD-10-CM | POA: Diagnosis not present

## 2019-10-13 DIAGNOSIS — I10 Essential (primary) hypertension: Secondary | ICD-10-CM | POA: Diagnosis not present

## 2019-10-17 DIAGNOSIS — G4733 Obstructive sleep apnea (adult) (pediatric): Secondary | ICD-10-CM | POA: Diagnosis not present

## 2019-10-18 DIAGNOSIS — Z7902 Long term (current) use of antithrombotics/antiplatelets: Secondary | ICD-10-CM | POA: Diagnosis not present

## 2019-10-18 DIAGNOSIS — Z7982 Long term (current) use of aspirin: Secondary | ICD-10-CM | POA: Diagnosis not present

## 2019-10-18 DIAGNOSIS — E108 Type 1 diabetes mellitus with unspecified complications: Secondary | ICD-10-CM | POA: Diagnosis not present

## 2019-10-18 DIAGNOSIS — I10 Essential (primary) hypertension: Secondary | ICD-10-CM | POA: Diagnosis not present

## 2019-10-18 DIAGNOSIS — Z951 Presence of aortocoronary bypass graft: Secondary | ICD-10-CM | POA: Diagnosis not present

## 2019-10-18 DIAGNOSIS — Z955 Presence of coronary angioplasty implant and graft: Secondary | ICD-10-CM | POA: Diagnosis not present

## 2019-10-18 DIAGNOSIS — Z79899 Other long term (current) drug therapy: Secondary | ICD-10-CM | POA: Diagnosis not present

## 2019-10-20 DIAGNOSIS — Z79899 Other long term (current) drug therapy: Secondary | ICD-10-CM | POA: Diagnosis not present

## 2019-10-20 DIAGNOSIS — I10 Essential (primary) hypertension: Secondary | ICD-10-CM | POA: Diagnosis not present

## 2019-10-20 DIAGNOSIS — Z955 Presence of coronary angioplasty implant and graft: Secondary | ICD-10-CM | POA: Diagnosis not present

## 2019-10-20 DIAGNOSIS — Z951 Presence of aortocoronary bypass graft: Secondary | ICD-10-CM | POA: Diagnosis not present

## 2019-10-20 DIAGNOSIS — Z7902 Long term (current) use of antithrombotics/antiplatelets: Secondary | ICD-10-CM | POA: Diagnosis not present

## 2019-10-20 DIAGNOSIS — E108 Type 1 diabetes mellitus with unspecified complications: Secondary | ICD-10-CM | POA: Diagnosis not present

## 2019-10-20 DIAGNOSIS — Z7982 Long term (current) use of aspirin: Secondary | ICD-10-CM | POA: Diagnosis not present

## 2019-10-25 ENCOUNTER — Other Ambulatory Visit: Payer: Self-pay

## 2019-10-25 ENCOUNTER — Ambulatory Visit (INDEPENDENT_AMBULATORY_CARE_PROVIDER_SITE_OTHER): Payer: BC Managed Care – PPO

## 2019-10-25 DIAGNOSIS — Z951 Presence of aortocoronary bypass graft: Secondary | ICD-10-CM | POA: Diagnosis not present

## 2019-10-25 DIAGNOSIS — R0989 Other specified symptoms and signs involving the circulatory and respiratory systems: Secondary | ICD-10-CM | POA: Diagnosis not present

## 2019-10-25 DIAGNOSIS — Z7902 Long term (current) use of antithrombotics/antiplatelets: Secondary | ICD-10-CM | POA: Diagnosis not present

## 2019-10-25 DIAGNOSIS — I10 Essential (primary) hypertension: Secondary | ICD-10-CM | POA: Diagnosis not present

## 2019-10-25 DIAGNOSIS — Z955 Presence of coronary angioplasty implant and graft: Secondary | ICD-10-CM | POA: Diagnosis not present

## 2019-10-25 DIAGNOSIS — Z79899 Other long term (current) drug therapy: Secondary | ICD-10-CM | POA: Diagnosis not present

## 2019-10-25 DIAGNOSIS — Z7982 Long term (current) use of aspirin: Secondary | ICD-10-CM | POA: Diagnosis not present

## 2019-10-25 DIAGNOSIS — E108 Type 1 diabetes mellitus with unspecified complications: Secondary | ICD-10-CM | POA: Diagnosis not present

## 2019-10-25 LAB — ECHOCARDIOGRAM COMPLETE
Area-P 1/2: 4.06 cm2
Calc EF: 52.9 %
S' Lateral: 3 cm
Single Plane A2C EF: 47.6 %
Single Plane A4C EF: 55.4 %

## 2019-10-25 NOTE — Progress Notes (Signed)
Complete echocardiogram performed.  Jimmy Aislynn Cifelli RDCS, RVT  

## 2019-10-27 DIAGNOSIS — I10 Essential (primary) hypertension: Secondary | ICD-10-CM | POA: Diagnosis not present

## 2019-10-27 DIAGNOSIS — Z955 Presence of coronary angioplasty implant and graft: Secondary | ICD-10-CM | POA: Diagnosis not present

## 2019-10-27 DIAGNOSIS — Z7902 Long term (current) use of antithrombotics/antiplatelets: Secondary | ICD-10-CM | POA: Diagnosis not present

## 2019-10-27 DIAGNOSIS — E108 Type 1 diabetes mellitus with unspecified complications: Secondary | ICD-10-CM | POA: Diagnosis not present

## 2019-10-27 DIAGNOSIS — Z951 Presence of aortocoronary bypass graft: Secondary | ICD-10-CM | POA: Diagnosis not present

## 2019-10-27 DIAGNOSIS — Z79899 Other long term (current) drug therapy: Secondary | ICD-10-CM | POA: Diagnosis not present

## 2019-10-27 DIAGNOSIS — Z7982 Long term (current) use of aspirin: Secondary | ICD-10-CM | POA: Diagnosis not present

## 2019-11-01 DIAGNOSIS — E108 Type 1 diabetes mellitus with unspecified complications: Secondary | ICD-10-CM | POA: Diagnosis not present

## 2019-11-01 DIAGNOSIS — Z951 Presence of aortocoronary bypass graft: Secondary | ICD-10-CM | POA: Diagnosis not present

## 2019-11-01 DIAGNOSIS — I10 Essential (primary) hypertension: Secondary | ICD-10-CM | POA: Diagnosis not present

## 2019-11-01 DIAGNOSIS — Z7902 Long term (current) use of antithrombotics/antiplatelets: Secondary | ICD-10-CM | POA: Diagnosis not present

## 2019-11-01 DIAGNOSIS — Z955 Presence of coronary angioplasty implant and graft: Secondary | ICD-10-CM | POA: Diagnosis not present

## 2019-11-01 DIAGNOSIS — Z79899 Other long term (current) drug therapy: Secondary | ICD-10-CM | POA: Diagnosis not present

## 2019-11-01 DIAGNOSIS — Z7982 Long term (current) use of aspirin: Secondary | ICD-10-CM | POA: Diagnosis not present

## 2019-11-02 ENCOUNTER — Other Ambulatory Visit: Payer: Self-pay | Admitting: Surgical

## 2019-11-03 DIAGNOSIS — Z951 Presence of aortocoronary bypass graft: Secondary | ICD-10-CM | POA: Diagnosis not present

## 2019-11-03 DIAGNOSIS — Z955 Presence of coronary angioplasty implant and graft: Secondary | ICD-10-CM | POA: Diagnosis not present

## 2019-11-03 DIAGNOSIS — I10 Essential (primary) hypertension: Secondary | ICD-10-CM | POA: Diagnosis not present

## 2019-11-03 DIAGNOSIS — Z7982 Long term (current) use of aspirin: Secondary | ICD-10-CM | POA: Diagnosis not present

## 2019-11-03 DIAGNOSIS — Z7902 Long term (current) use of antithrombotics/antiplatelets: Secondary | ICD-10-CM | POA: Diagnosis not present

## 2019-11-03 DIAGNOSIS — Z79899 Other long term (current) drug therapy: Secondary | ICD-10-CM | POA: Diagnosis not present

## 2019-11-03 DIAGNOSIS — E108 Type 1 diabetes mellitus with unspecified complications: Secondary | ICD-10-CM | POA: Diagnosis not present

## 2019-11-04 ENCOUNTER — Telehealth: Payer: Self-pay | Admitting: Cardiology

## 2019-11-04 NOTE — Telephone Encounter (Signed)
Called patient back in formed him of results. No further questions.  

## 2019-11-04 NOTE — Telephone Encounter (Signed)
Pt returning call for Echo results from 9/20  Best number [phone]

## 2019-11-05 DIAGNOSIS — I252 Old myocardial infarction: Secondary | ICD-10-CM | POA: Diagnosis not present

## 2019-11-05 DIAGNOSIS — Z951 Presence of aortocoronary bypass graft: Secondary | ICD-10-CM | POA: Diagnosis not present

## 2019-11-08 ENCOUNTER — Other Ambulatory Visit: Payer: Self-pay | Admitting: Surgical

## 2019-11-08 DIAGNOSIS — I252 Old myocardial infarction: Secondary | ICD-10-CM | POA: Diagnosis not present

## 2019-11-08 DIAGNOSIS — Z951 Presence of aortocoronary bypass graft: Secondary | ICD-10-CM | POA: Diagnosis not present

## 2019-11-09 DIAGNOSIS — R5383 Other fatigue: Secondary | ICD-10-CM | POA: Diagnosis not present

## 2019-11-09 DIAGNOSIS — R400 Somnolence: Secondary | ICD-10-CM | POA: Diagnosis not present

## 2019-11-09 DIAGNOSIS — G4733 Obstructive sleep apnea (adult) (pediatric): Secondary | ICD-10-CM | POA: Diagnosis not present

## 2019-11-10 DIAGNOSIS — Z951 Presence of aortocoronary bypass graft: Secondary | ICD-10-CM | POA: Diagnosis not present

## 2019-11-10 DIAGNOSIS — I252 Old myocardial infarction: Secondary | ICD-10-CM | POA: Diagnosis not present

## 2019-11-10 DIAGNOSIS — F332 Major depressive disorder, recurrent severe without psychotic features: Secondary | ICD-10-CM | POA: Diagnosis not present

## 2019-11-15 DIAGNOSIS — Z951 Presence of aortocoronary bypass graft: Secondary | ICD-10-CM | POA: Diagnosis not present

## 2019-11-15 DIAGNOSIS — I252 Old myocardial infarction: Secondary | ICD-10-CM | POA: Diagnosis not present

## 2019-11-16 DIAGNOSIS — G4733 Obstructive sleep apnea (adult) (pediatric): Secondary | ICD-10-CM | POA: Diagnosis not present

## 2019-11-19 DIAGNOSIS — Z951 Presence of aortocoronary bypass graft: Secondary | ICD-10-CM | POA: Diagnosis not present

## 2019-11-19 DIAGNOSIS — I252 Old myocardial infarction: Secondary | ICD-10-CM | POA: Diagnosis not present

## 2019-11-22 DIAGNOSIS — I252 Old myocardial infarction: Secondary | ICD-10-CM | POA: Diagnosis not present

## 2019-11-22 DIAGNOSIS — Z951 Presence of aortocoronary bypass graft: Secondary | ICD-10-CM | POA: Diagnosis not present

## 2019-11-24 DIAGNOSIS — Z951 Presence of aortocoronary bypass graft: Secondary | ICD-10-CM | POA: Diagnosis not present

## 2019-11-24 DIAGNOSIS — I252 Old myocardial infarction: Secondary | ICD-10-CM | POA: Diagnosis not present

## 2019-11-30 ENCOUNTER — Telehealth: Payer: Self-pay | Admitting: Cardiology

## 2019-11-30 NOTE — Telephone Encounter (Signed)
Pt came in today requesting a refill on plavix. Has already called pharmacy, they won't refill.

## 2019-12-01 ENCOUNTER — Other Ambulatory Visit: Payer: Self-pay | Admitting: Surgical

## 2019-12-02 DIAGNOSIS — E1065 Type 1 diabetes mellitus with hyperglycemia: Secondary | ICD-10-CM | POA: Diagnosis not present

## 2019-12-02 DIAGNOSIS — Z125 Encounter for screening for malignant neoplasm of prostate: Secondary | ICD-10-CM | POA: Diagnosis not present

## 2019-12-02 DIAGNOSIS — I152 Hypertension secondary to endocrine disorders: Secondary | ICD-10-CM | POA: Diagnosis not present

## 2019-12-02 DIAGNOSIS — E1159 Type 2 diabetes mellitus with other circulatory complications: Secondary | ICD-10-CM | POA: Diagnosis not present

## 2019-12-02 DIAGNOSIS — R8290 Unspecified abnormal findings in urine: Secondary | ICD-10-CM | POA: Diagnosis not present

## 2019-12-02 DIAGNOSIS — Z6822 Body mass index (BMI) 22.0-22.9, adult: Secondary | ICD-10-CM | POA: Diagnosis not present

## 2019-12-02 MED ORDER — CLOPIDOGREL BISULFATE 75 MG PO TABS: 75.0000 mg | ORAL_TABLET | Freq: Every day | ORAL | 2 refills | Status: DC

## 2019-12-02 NOTE — Telephone Encounter (Signed)
He has, Plavix 75 mg daily

## 2019-12-02 NOTE — Telephone Encounter (Signed)
Medication filled.  

## 2019-12-03 DIAGNOSIS — Z23 Encounter for immunization: Secondary | ICD-10-CM | POA: Diagnosis not present

## 2019-12-03 DIAGNOSIS — Z951 Presence of aortocoronary bypass graft: Secondary | ICD-10-CM | POA: Diagnosis not present

## 2019-12-03 DIAGNOSIS — I252 Old myocardial infarction: Secondary | ICD-10-CM | POA: Diagnosis not present

## 2019-12-06 DIAGNOSIS — I252 Old myocardial infarction: Secondary | ICD-10-CM | POA: Diagnosis not present

## 2019-12-06 DIAGNOSIS — Z951 Presence of aortocoronary bypass graft: Secondary | ICD-10-CM | POA: Diagnosis not present

## 2019-12-08 DIAGNOSIS — Z951 Presence of aortocoronary bypass graft: Secondary | ICD-10-CM | POA: Diagnosis not present

## 2019-12-08 DIAGNOSIS — I252 Old myocardial infarction: Secondary | ICD-10-CM | POA: Diagnosis not present

## 2019-12-13 DIAGNOSIS — I252 Old myocardial infarction: Secondary | ICD-10-CM | POA: Diagnosis not present

## 2019-12-13 DIAGNOSIS — Z951 Presence of aortocoronary bypass graft: Secondary | ICD-10-CM | POA: Diagnosis not present

## 2019-12-15 DIAGNOSIS — I252 Old myocardial infarction: Secondary | ICD-10-CM | POA: Diagnosis not present

## 2019-12-15 DIAGNOSIS — Z951 Presence of aortocoronary bypass graft: Secondary | ICD-10-CM | POA: Diagnosis not present

## 2019-12-16 ENCOUNTER — Other Ambulatory Visit: Payer: Self-pay | Admitting: Surgical

## 2019-12-17 DIAGNOSIS — G4733 Obstructive sleep apnea (adult) (pediatric): Secondary | ICD-10-CM | POA: Diagnosis not present

## 2019-12-20 DIAGNOSIS — Z951 Presence of aortocoronary bypass graft: Secondary | ICD-10-CM | POA: Diagnosis not present

## 2019-12-20 DIAGNOSIS — I252 Old myocardial infarction: Secondary | ICD-10-CM | POA: Diagnosis not present

## 2019-12-24 DIAGNOSIS — Z951 Presence of aortocoronary bypass graft: Secondary | ICD-10-CM | POA: Diagnosis not present

## 2019-12-24 DIAGNOSIS — I252 Old myocardial infarction: Secondary | ICD-10-CM | POA: Diagnosis not present

## 2019-12-28 DIAGNOSIS — I252 Old myocardial infarction: Secondary | ICD-10-CM | POA: Diagnosis not present

## 2019-12-28 DIAGNOSIS — Z951 Presence of aortocoronary bypass graft: Secondary | ICD-10-CM | POA: Diagnosis not present

## 2020-01-03 ENCOUNTER — Other Ambulatory Visit: Payer: Self-pay

## 2020-01-03 DIAGNOSIS — F419 Anxiety disorder, unspecified: Secondary | ICD-10-CM | POA: Insufficient documentation

## 2020-01-03 DIAGNOSIS — E119 Type 2 diabetes mellitus without complications: Secondary | ICD-10-CM | POA: Insufficient documentation

## 2020-01-03 DIAGNOSIS — R5383 Other fatigue: Secondary | ICD-10-CM | POA: Diagnosis not present

## 2020-01-03 DIAGNOSIS — I10 Essential (primary) hypertension: Secondary | ICD-10-CM | POA: Insufficient documentation

## 2020-01-03 DIAGNOSIS — G4733 Obstructive sleep apnea (adult) (pediatric): Secondary | ICD-10-CM | POA: Diagnosis not present

## 2020-01-03 DIAGNOSIS — M1990 Unspecified osteoarthritis, unspecified site: Secondary | ICD-10-CM | POA: Insufficient documentation

## 2020-01-03 DIAGNOSIS — E109 Type 1 diabetes mellitus without complications: Secondary | ICD-10-CM | POA: Insufficient documentation

## 2020-01-03 DIAGNOSIS — Z951 Presence of aortocoronary bypass graft: Secondary | ICD-10-CM | POA: Diagnosis not present

## 2020-01-03 DIAGNOSIS — I252 Old myocardial infarction: Secondary | ICD-10-CM | POA: Diagnosis not present

## 2020-01-03 DIAGNOSIS — R400 Somnolence: Secondary | ICD-10-CM | POA: Diagnosis not present

## 2020-01-03 DIAGNOSIS — F32A Depression, unspecified: Secondary | ICD-10-CM | POA: Insufficient documentation

## 2020-01-03 DIAGNOSIS — Z87442 Personal history of urinary calculi: Secondary | ICD-10-CM | POA: Insufficient documentation

## 2020-01-05 ENCOUNTER — Other Ambulatory Visit: Payer: Self-pay

## 2020-01-05 ENCOUNTER — Ambulatory Visit (INDEPENDENT_AMBULATORY_CARE_PROVIDER_SITE_OTHER): Payer: BC Managed Care – PPO | Admitting: Cardiology

## 2020-01-05 ENCOUNTER — Telehealth: Payer: Self-pay | Admitting: Cardiology

## 2020-01-05 ENCOUNTER — Encounter: Payer: Self-pay | Admitting: Cardiology

## 2020-01-05 VITALS — BP 110/72 | HR 81 | Ht 64.0 in | Wt 137.2 lb

## 2020-01-05 DIAGNOSIS — I10 Essential (primary) hypertension: Secondary | ICD-10-CM | POA: Diagnosis not present

## 2020-01-05 DIAGNOSIS — Z951 Presence of aortocoronary bypass graft: Secondary | ICD-10-CM

## 2020-01-05 DIAGNOSIS — E119 Type 2 diabetes mellitus without complications: Secondary | ICD-10-CM | POA: Diagnosis not present

## 2020-01-05 DIAGNOSIS — R251 Tremor, unspecified: Secondary | ICD-10-CM | POA: Diagnosis not present

## 2020-01-05 NOTE — Telephone Encounter (Signed)
     Pt said he forgot to ask something earlier during his appt and would like to speak with a nurse

## 2020-01-05 NOTE — Telephone Encounter (Signed)
Spoke with the patient who states that before he had his heart attack he was on CPAP. He has since been off of CPAP and has been feeling good. Denies any daytime sleepiness, fatigue, snoring, or gasping during the night. He states that he has been feeling good and would like to know if Dr. Bing Matter thinks that he should be on CPAP or if he is okay being off of it.

## 2020-01-05 NOTE — Patient Instructions (Signed)
Medication Instructions:  .isntcu  *If you need a refill on your cardiac medications before your next appointment, please call your pharmacy*   Lab Work: Your physician recommends that you return for lab work today: lipid   If you have labs (blood work) drawn today and your tests are completely normal, you will receive your results only by: Marland Kitchen MyChart Message (if you have MyChart) OR . A paper copy in the mail If you have any lab test that is abnormal or we need to change your treatment, we will call you to review the results.   Testing/Procedures: None   Follow-Up: At [facility], you and your health needs are our priority.  As part of our continuing mission to provide you with exceptional heart care, we have created designated Provider Care Teams.  These Care Teams include your primary Cardiologist (physician) and Advanced Practice Providers (APPs -  Physician Assistants and Nurse Practitioners) who all work together to provide you with the care you need, when you need it.  We recommend signing up for the patient portal called "MyChart".  Sign up information is provided on this After Visit Summary.  MyChart is used to connect with patients for Virtual Visits (Telemedicine).  Patients are able to view lab/test results, encounter notes, upcoming appointments, etc.  Non-urgent messages can be sent to your provider as well.   To learn more about what you can do with MyChart, go to [email or web address].    Your next appointment:   5 month(s)  The format for your next appointment:   In Person  Provider:   Gypsy Balsam, MD   Other Instructions

## 2020-01-05 NOTE — Progress Notes (Signed)
Cardiology Office Note:    Date:  01/05/2020   ID:  Jonathan Holder, DOB [birth year], MRN [id number]  PCP:  Noni Saupe, MD  Cardiologist:  Gypsy Balsam, MD    Referring MD: Noni Saupe, MD   No chief complaint on file. Doing very well in  History of Present Illness:    Jonathan Holder is a 64 y.o. male  with past medical history significant for coronary artery disease.  He came to [facility] with episode of chest pain that he was transferred to [facility], cardiac catheterization was done which showed 90% mid LAD 90% intermediate branch 80% stenosis of the circumflex artery and 70% of right coronary artery.  He was found to have ejection fraction 25 to 30%.  He end up having coronary artery bypass graft with 5 grafts.   Comes today to my office for follow-up likely echocardiogram repeated 2 months after bypass surgery show significant improvement left ventricle ejection fraction from 25% to 45.  He is doing very well he goes to rehab on the regular basis he is enjoying it.  Denies have any chest pain tightness squeezing pressure burning chest no shortness of breath.  He is diabetes also is much better control.  Past Medical History:  Diagnosis Date  . Anxiety   . Arthritis   . Depression   . Diabetes mellitus without complication (HCC)   . Essential hypertension 08/20/2019  . History of kidney stones   . Hyperlipidemia associated with type 2 diabetes mellitus (HCC) 08/20/2019  . Hypertension   . 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 from [facility] 08/20/2019  . Multi-vessel coronary artery stenosis 08/20/2019  . 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 Subsequent ST elevation (STEMI) myocardial infarction of anterior wall within 4 weeks of initial infarction (HCC) 08/20/2019  . Type 2 diabetes mellitus with complication, with long-term current use of insulin (HCC) 08/20/2019    Past Surgical History:  Procedure Laterality Date  . ANTERIOR CERVICAL  DECOMP/DISCECTOMY FUSION N/A 07/31/2016   Procedure: ANTERIOR CERVICAL DECOMPRESSION/DISCECTOMY FUSION CERVICAL FOUR- CERVICAL FIVE, CERVICAL FIVE- CERVICAL SIX, CERVICAL SIX, CERVICAL SEVEN;  Surgeon: Shirlean Kelly, MD;  Location: [facility] OR;  Service: Neurosurgery;  Laterality: N/A;  . CORONARY ARTERY BYPASS GRAFT N/A 08/24/2019   Procedure: CORONARY ARTERY BYPASS GRAFTING (CABG), ON PUMP, TIMES FIVE, USING BILATERAL INTERNAL MAMMARY ARTERIES, ENDOSCOPICALLY HARVESTED RIGHT GREATER SAPHENOUS VEIN, AND OPEN LEFT RADIAL ARTERY HARVESTING;  Surgeon: Linden Dolin, MD;  Location: [facility] OR;  Service: Open Heart Surgery;  Laterality: N/A;  . DG THUMB RIGHT HAND ([facility] HX) Left    plate  . EYE SURGERY Bilateral    cataract removed  . LEFT HEART CATH AND CORONARY ANGIOGRAPHY N/A 08/20/2019   Procedure: LEFT HEART CATH AND CORONARY ANGIOGRAPHY;  Surgeon: Marykay Lex, MD;  Location: [facility] INVASIVE CV LAB;  Service: Cardiovascular;  Laterality: N/A;  . PENILE PROSTHESIS IMPLANT    . RADIAL ARTERY HARVEST Left 08/24/2019   Procedure: RADIAL ARTERY HARVEST;  Surgeon: Linden Dolin, MD;  Location: [facility] OR;  Service: Open Heart Surgery;  Laterality: Left;  . TEE WITHOUT CARDIOVERSION N/A 08/24/2019   Procedure: TRANSESOPHAGEAL ECHOCARDIOGRAM (TEE);  Surgeon: Linden Dolin, MD;  Location: [facility] OR;  Service: Open Heart Surgery;  Laterality: N/A;    Current Medications: Current Meds  Medication Sig  . amLODipine (NORVASC) 2.5 MG tablet Take 2.5 mg by mouth daily.  Marland Kitchen aspirin EC 81 MG EC tablet Take  1 tablet (81 mg total) by mouth daily. Swallow whole.  Marland Kitchen atorvastatin (LIPITOR) 80 MG tablet Take 1 tablet (80 mg total) by mouth daily.  . clopidogrel (PLAVIX) 75 MG tablet Take 1 tablet (75 mg total) by mouth daily.  Marland Kitchen desvenlafaxine (PRISTIQ) 50 MG 24 hr tablet Take 50 mg by mouth daily.  . famotidine (PEPCID) 40 MG tablet Take 40 mg by mouth daily.  Marland Kitchen gabapentin (NEURONTIN) 300 MG capsule Take 900 mg by mouth  every evening.   Marland Kitchen NOVOLOG FLEXPEN 100 UNIT/ML FlexPen Inject 3 mLs into the skin daily.  Evaristo Bury FLEXTOUCH 100 UNIT/ML FlexTouch Pen Inject 100 Units into the skin daily.  Marland Kitchen VRAYLAR capsule Take 3 mg by mouth at bedtime.     Allergies:   Patient has no known allergies.   Social History   Socioeconomic History  . Marital status: Married    Spouse name: Not on file  . Number of children: Not on file  . Years of education: Not on file  . Highest education level: Not on file  Occupational History  . Not on file  Tobacco Use  . Smoking status: Never Smoker  . Smokeless tobacco: Never Used  Vaping Use  . Vaping Use: Never used  Substance and Sexual Activity  . Alcohol use: No  . Drug use: No  . Sexual activity: Yes  Other Topics Concern  . Not on file  Social History Narrative   Works for [employer] business   Social Determinants of Health   Financial Resource Strain:   . Difficulty of Paying Living Expenses: Not on file  Food Insecurity:   . Worried About Programme researcher, broadcasting/film/video in the Last Year: Not on file  . Ran Out of Food in the Last Year: Not on file  Transportation Needs:   . Lack of Transportation (Medical): Not on file  . Lack of Transportation (Non-Medical): Not on file  Physical Activity:   . Days of Exercise per Week: Not on file  . Minutes of Exercise per Session: Not on file  Stress:   . Feeling of Stress : Not on file  Social Connections:   . Frequency of Communication with Friends and Family: Not on file  . Frequency of Social Gatherings with Friends and Family: Not on file  . Attends Religious Services: Not on file  . Active Member of Clubs or Organizations: Not on file  . Attends Banker Meetings: Not on file  . Marital Status: Not on file     Family History: The patient's family history is not on file. ROS:   Please see the history of present illness.    All 14 point review of systems negative except as described per history  of present illness  EKGs/Labs/Other Studies Reviewed:      Recent Labs: 08/21/2019: ALT 9; B Natriuretic Peptide 513.2 08/25/2019: Magnesium 2.1 09/14/2019: BUN 27; Creatinine, Ser 1.51; Hemoglobin 12.8; Platelets 379; Potassium 5.7; Sodium 141  Recent Lipid Panel    Component Value Date/Time   CHOL 86 08/21/2019 0757   TRIG 40 08/21/2019 0757   HDL 30 (L) 08/21/2019 0757   CHOLHDL 2.9 08/21/2019 0757   VLDL 8 08/21/2019 0757   LDLCALC 48 08/21/2019 0757    Physical Exam:    VS:  BP 110/72   Pulse 81   Ht 5\' 4"  (1.626 m)   Wt 137 lb 3.2 oz (62.2 kg)   SpO2 95%   BMI 23.55 kg/m  Wt Readings from Last 3 Encounters:  01/05/20 137 lb 3.2 oz (62.2 kg)  09/28/19 130 lb (59 kg)  09/20/19 128 lb (58.1 kg)     GEN:  Well nourished, well developed in no acute distress HEENT: Normal NECK: No JVD; No carotid bruits LYMPHATICS: No lymphadenopathy CARDIAC: RRR, no murmurs, no rubs, no gallops RESPIRATORY:  Clear to auscultation without rales, wheezing or rhonchi  ABDOMEN: Soft, non-tender, non-distended MUSCULOSKELETAL:  No edema; No deformity  SKIN: Warm and dry LOWER EXTREMITIES: no swelling NEUROLOGIC:  Alert and oriented x 3 PSYCHIATRIC:  Normal affect   ASSESSMENT:    1. 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 Diabetes mellitus without complication (HCC)   3. Essential hypertension    PLAN:    In order of problems listed above:  1. Status post coronary bypass graft in July 2021 doing very well from that point review wound healed completely he is doing well. 2. Diabetes follow-up by primary care physician stable. 3. Essential hypertension blood pressure well controlled continue present management. 4. Tremor especially of the left hand.  I strongly suspect he does have Parkinson.  I will refer him to neurology for evaluation.  He tells me that he developed some tremor couple months ago then he was able to control it but now it became uncontrollable.  It is quite significant  and obviously visible.   Medication Adjustments/Labs and Tests Ordered: Current medicines are reviewed at length with the patient today.  Concerns regarding medicines are outlined above.  No orders of the defined types were placed in this encounter.  Medication changes: No orders of the defined types were placed in this encounter.   Signed, Georgeanna Lea, MD, [facility] 01/05/2020 4:10 PM    [facility]

## 2020-01-05 NOTE — Addendum Note (Signed)
Addended by: Hazle Quant on: 01/05/2020 04:15 PM   Modules accepted: Orders

## 2020-01-06 LAB — LIPID PANEL
Chol/HDL Ratio: 2.5 ratio (ref 0.0–5.0)
Cholesterol, Total: 144 mg/dL (ref 100–199)
HDL: 58 mg/dL (ref >39)
LDL Chol Calc (NIH): 74 mg/dL (ref 0–99)
Triglycerides: 60 mg/dL (ref 0–149)
VLDL Cholesterol Cal: 12 mg/dL (ref 5–40)

## 2020-01-06 NOTE — Telephone Encounter (Signed)
I think he need to be evaluated by whoever gave him CPAP for a need for it.

## 2020-01-07 NOTE — Progress Notes (Signed)
Assessment/Plan:    1.  Parkinsonism  -I had a long counseling session with the patient today.  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  I explained that one clinically cannot tell the difference between idiopathic parkinsons disease and secondary parkinsonism from medication.  In addition, Vraylar specifically has quite high rates of parkinsonism compared to even other antipsychotic medications.   I also explained that even if one is able to get off of the medication, it can take up to 6 months to clinically definitively know if this is idiopathic parkinsons disease.  I did not advise that the patient go off of medication, as this needs to be discussed with the patients prescribing physician.  I did, however, tell the patient that the longer one is on the medication, the worse the symptoms can get.  The patient is to make an appointment with Dr. Jeanie Sewer to discuss what I have discussed with him.  2.  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  This is very mild, but the patient has noticed it as well.  This is likely reversible, but tardive dyskinesia/EPS is less likely to reverse than secondary parkinsonism.  However, he has not been exposed to a very large for long, so I am optimistic that this will reverse if he is able to get off of the medication, if clinically indicated.  3.  Patient is to follow-up with me if he still has symptoms in 6 months, if able to get off of medication.  Patient expressed understanding and appreciation.   Subjective:   Jonathan Holder was seen today in the movement disorders clinic for neurologic consultation at the request of Georgeanna Lea, MD.  The consultation is for the evaluation of L hand tremor and to r/o PD.  Outside records that were made available to me were reviewed.  Patient was seen by  cardiology on December 1 and tremor was noted, with suspicion for Parkinson's disease.  He was referred here for further evaluation.   Specific Symptoms:  Tremor: Yes.  , L hand tremor x 3 months - tremor worse after exerting himself.  Not bothersome with eating.  He is R hand dominant.  Also has noticed some extra movements in the left hand over the last few months. Family hx of similar:  No. Voice: occasional raspy Sleep: sleeps well  Vivid Dreams:  Yes.    Acting out dreams:  No. Wet Pillows: No. Postural symptoms:  Yes.    Falls?  No. Bradykinesia symptoms: shuffling gait and difficulty getting out of a chair Loss of smell:  No. Loss of taste:  No. Urinary Incontinence:  No. Difficulty Swallowing:  No. Handwriting, micrographia: Yes.   Trouble with ADL's:  No.  Trouble buttoning clothing: No. Depression:  No., not currently Memory changes:  Yes.   - some long term issues Hallucinations:  No.  visual distortions: No. but has floaters N/V:  No. Lightheaded:  No.  Syncope: No. Diplopia:  No. Dyskinesia:  No. Prior exposure to reglan/antipsychotics: yes, vraylar (been on it for a few months - see Dr. Koren Bound)  Neuroimaging of the brain has not previously been performed.   ALLERGIES:  No Known Allergies  CURRENT MEDICATIONS:  Current Outpatient Medications  Medication Instructions  . amLODipine (NORVASC) 2.5 mg, Oral, Daily  . aspirin 81 mg, Oral, Daily, Swallow whole.  Marland Kitchen  atorvastatin (LIPITOR) 80 mg, Oral, Daily  . clopidogrel (PLAVIX) 75 mg, Oral, Daily  . desvenlafaxine (PRISTIQ) 50 mg, Oral, Daily  . famotidine (PEPCID) 40 mg, Oral, Daily  . gabapentin (NEURONTIN) 900 mg, Oral, Every evening  . metoprolol succinate (TOPROL-XL) 25 mg, Oral, Daily, Take with or immediately following a meal.  . NOVOLOG FLEXPEN 100 UNIT/ML FlexPen 3 mLs, Subcutaneous, Daily  . Tresiba FlexTouch 29 Units, Subcutaneous, Daily  . Vraylar 3 mg, Oral, Daily at bedtime    Objective:    VITALS:   Vitals:   01/10/20 1255  BP: 124/66  Pulse: 88  SpO2: 98%  Weight: 137 lb (62.1 kg)  Height: 5\' 4"  (1.626 m)    GEN:  The patient appears stated age and is in NAD. HEENT:  Normocephalic, atraumatic.  The mucous membranes are moist. The superficial temporal arteries are without ropiness or tenderness. CV:  RRR Lungs:  CTAB Neck/HEME:  There are no carotid bruits bilaterally.  Neurological examination:  Orientation: The patient is alert and oriented x3.  Cranial nerves: There is good facial symmetry. Extraocular muscles are intact. The visual fields are full to confrontational testing. The speech is fluent and clear. Soft palate rises symmetrically and there is no tongue deviation. Hearing is intact to conversational tone. Sensation: Sensation is intact to light and pinprick throughout (facial, trunk, extremities). Vibration is intact at the bilateral ankle although slightly decreased. There is no extinction with double simultaneous stimulation. There is no sensory dermatomal level identified. Motor: Strength is 5/5 in the bilateral upper and lower extremities.   Shoulder shrug is equal and symmetric.  There is no pronator drift. Deep tendon reflexes: Deep tendon reflexes are 1/4 at the bilateral biceps, triceps, brachioradialis, 2/4 at the bilateral patella. Plantar responses are downgoing bilaterally.  Movement examination: Tone: There is nl tone in the bilateral upper extremities.  The tone in the lower extremities is nl.  Abnormal movements: there is LUE rest tremor.  With ambulation, there is not any left upper extremity rest tremor, but there is also dyskinetic movements of the left hand/fingers. Coordination:  There is mild decremation with RAM's, with hand opening and closing and finger taps on the RIGHT.  All other RAMs are nl Gait and Station: The patient has mild difficulty arising out of a deep-seated chair without the use of the hands. The patient's stride length  is slightly decreased.   I have reviewed and interpreted the following labs independently   Chemistry      Component Value Date/Time   NA 141 09/14/2019 1643   K 5.7 (H) 09/14/2019 1643   CL 100 09/14/2019 1643   CO2 27 09/14/2019 1643   BUN 27 09/14/2019 1643   CREATININE 1.51 (H) 09/14/2019 1643      Component Value Date/Time   CALCIUM 9.9 09/14/2019 1643   ALKPHOS 73 08/21/2019 0757   AST 13 (L) 08/21/2019 0757   ALT 9 08/21/2019 0757   BILITOT 0.5 08/21/2019 0757      No results found for: TSH Lab Results  Component Value Date   WBC 9.3 09/14/2019   HGB 12.8 (L) 09/14/2019   HCT 40.8 09/14/2019   MCV 94 09/14/2019   PLT 379 09/14/2019     Total time spent on today's visit was 60 minutes, including both face-to-face time and nonface-to-face time.  Time included that spent on review of records (prior notes available to me/labs/imaging if pertinent), discussing treatment and goals, answering patient's questions and coordinating care.  Cc:  Noni Saupe, MD

## 2020-01-07 NOTE — Telephone Encounter (Signed)
Spoke with the patient who states that he has already discussed this with the provider that ordered his CPAP. He states that they told him that he needs to continue to use it, however the patient states that he does not plan to start using it again.

## 2020-01-10 ENCOUNTER — Encounter: Payer: Self-pay | Admitting: Neurology

## 2020-01-10 ENCOUNTER — Ambulatory Visit (INDEPENDENT_AMBULATORY_CARE_PROVIDER_SITE_OTHER): Payer: BC Managed Care – PPO | Admitting: Neurology

## 2020-01-10 ENCOUNTER — Other Ambulatory Visit: Payer: Self-pay

## 2020-01-10 VITALS — BP 124/66 | HR 88 | Ht 64.0 in | Wt 137.0 lb

## 2020-01-10 DIAGNOSIS — G2111 Neuroleptic induced parkinsonism: Secondary | ICD-10-CM

## 2020-01-10 DIAGNOSIS — I252 Old myocardial infarction: Secondary | ICD-10-CM | POA: Diagnosis not present

## 2020-01-10 DIAGNOSIS — G2401 Drug induced subacute dyskinesia: Secondary | ICD-10-CM

## 2020-01-10 DIAGNOSIS — Z951 Presence of aortocoronary bypass graft: Secondary | ICD-10-CM | POA: Diagnosis not present

## 2020-01-10 NOTE — Patient Instructions (Signed)
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  You likely have extrapyramidal symptoms (the movements in the hand, separate from tremor) from [place] as well.  Please call your prescribing physician and see if there are alternatives.  Do NOT stop the medication without approval from the prescribing physician.  Many of the antipsychotic medications have this risk.  Seroquel has a much lower risk of this.  IF you are able to get off of the vraylar and still have the tremor in 6 months, then you will need to follow up with me.    The physicians and staff at [facility] are committed to providing excellent care. You may receive a survey requesting feedback about your experience at our office. We strive to receive "very good" responses to the survey questions. If you feel that your experience would prevent you from giving the office a "very good " response, please contact our office to try to remedy the situation. We may be reached at [phone]. Thank you for taking the time out of your busy day to complete the survey.

## 2020-01-12 DIAGNOSIS — I252 Old myocardial infarction: Secondary | ICD-10-CM | POA: Diagnosis not present

## 2020-01-12 DIAGNOSIS — Z951 Presence of aortocoronary bypass graft: Secondary | ICD-10-CM | POA: Diagnosis not present

## 2020-01-16 DIAGNOSIS — G4733 Obstructive sleep apnea (adult) (pediatric): Secondary | ICD-10-CM | POA: Diagnosis not present

## 2020-01-17 ENCOUNTER — Telehealth: Payer: Self-pay | Admitting: Cardiology

## 2020-01-17 NOTE — Telephone Encounter (Signed)
Pt did not know his bp , he had not taken it.  Triage was not available .  Pt stated he has been feeling like this for a few days.  Called ended after 5 and pt is aware if he starts feeling worse to seek medical attention.

## 2020-01-17 NOTE — Telephone Encounter (Signed)
STAT if patient feels like he/she is going to faint   1) Are you dizzy now? Yes   2) Do you feel faint or have you passed out? No   3) Do you have any other symptoms? Pt is feeling sleepy and stumbling   4) Have you checked your HR and BP (record if available)? No

## 2020-01-18 NOTE — Telephone Encounter (Signed)
Called patient back. He reports that he he has been dizzy and "stumbling" for 2 days. Can not check blood pressure, no headache, no shortness of breath, no chest pain. He reported that he was feeling better today a little but still not great. I will discuss with Dr. Bing Matter and follow back up with patient.

## 2020-01-18 NOTE — Telephone Encounter (Signed)
Jonathan Holder is returning Hayley's call. Please advise.

## 2020-01-18 NOTE — Telephone Encounter (Signed)
Attempted to call patient. No answer and voicemail full.  

## 2020-01-19 NOTE — Telephone Encounter (Signed)
Please call him again asking how he is doing today.  Will be reasonable to check his heart rate and blood pressure.  It could be neurological.

## 2020-01-19 NOTE — Telephone Encounter (Signed)
Called patient. Informed he reports he is still feeling somewhat dizzy but a little better. He still denies any other symptom. He is still unable to give a blood pressure or heart rate reading. He will get new batteries for his monitor and call when he can get those readings. Will informed Dr. Bing Matter.

## 2020-01-26 DIAGNOSIS — I252 Old myocardial infarction: Secondary | ICD-10-CM | POA: Diagnosis not present

## 2020-01-26 DIAGNOSIS — Z951 Presence of aortocoronary bypass graft: Secondary | ICD-10-CM | POA: Diagnosis not present

## 2020-01-26 NOTE — Telephone Encounter (Signed)
He may need a sooner appointment with Dr. Bing Matter.  Is very difficult to make recommendations if the vitals are not available.  If the dizziness worsen he needs to go to the emergency department get evaluated.

## 2020-01-26 NOTE — Telephone Encounter (Signed)
Called patient. He reports dizziness is actually getting better. Advised him if it persists or get worse to go to the emergency room. He verbally understood I offered him a appointment with Dr. Bing Matter he didn't feel he needed at this time he will let us know if things get worse. No further questions.

## 2020-01-27 DIAGNOSIS — E1159 Type 2 diabetes mellitus with other circulatory complications: Secondary | ICD-10-CM | POA: Diagnosis not present

## 2020-01-27 DIAGNOSIS — I152 Hypertension secondary to endocrine disorders: Secondary | ICD-10-CM | POA: Diagnosis not present

## 2020-01-27 DIAGNOSIS — E1065 Type 1 diabetes mellitus with hyperglycemia: Secondary | ICD-10-CM | POA: Diagnosis not present

## 2020-01-27 DIAGNOSIS — Z6822 Body mass index (BMI) 22.0-22.9, adult: Secondary | ICD-10-CM | POA: Diagnosis not present

## 2020-01-31 DIAGNOSIS — Z951 Presence of aortocoronary bypass graft: Secondary | ICD-10-CM | POA: Diagnosis not present

## 2020-01-31 DIAGNOSIS — I252 Old myocardial infarction: Secondary | ICD-10-CM | POA: Diagnosis not present

## 2020-02-02 DIAGNOSIS — I252 Old myocardial infarction: Secondary | ICD-10-CM | POA: Diagnosis not present

## 2020-02-02 DIAGNOSIS — Z951 Presence of aortocoronary bypass graft: Secondary | ICD-10-CM | POA: Diagnosis not present

## 2020-02-03 DIAGNOSIS — I252 Old myocardial infarction: Secondary | ICD-10-CM | POA: Diagnosis not present

## 2020-02-03 DIAGNOSIS — Z951 Presence of aortocoronary bypass graft: Secondary | ICD-10-CM | POA: Diagnosis not present

## 2020-02-09 DIAGNOSIS — Z951 Presence of aortocoronary bypass graft: Secondary | ICD-10-CM | POA: Diagnosis not present

## 2020-02-09 DIAGNOSIS — I252 Old myocardial infarction: Secondary | ICD-10-CM | POA: Diagnosis not present

## 2020-02-11 DIAGNOSIS — H33303 Unspecified retinal break, bilateral: Secondary | ICD-10-CM | POA: Diagnosis not present

## 2020-02-11 DIAGNOSIS — E113393 Type 2 diabetes mellitus with moderate nonproliferative diabetic retinopathy without macular edema, bilateral: Secondary | ICD-10-CM | POA: Diagnosis not present

## 2020-02-11 DIAGNOSIS — H401131 Primary open-angle glaucoma, bilateral, mild stage: Secondary | ICD-10-CM | POA: Diagnosis not present

## 2020-02-11 DIAGNOSIS — H40003 Preglaucoma, unspecified, bilateral: Secondary | ICD-10-CM | POA: Diagnosis not present

## 2020-02-14 DIAGNOSIS — Z6822 Body mass index (BMI) 22.0-22.9, adult: Secondary | ICD-10-CM | POA: Diagnosis not present

## 2020-02-14 DIAGNOSIS — Z0000 Encounter for general adult medical examination without abnormal findings: Secondary | ICD-10-CM | POA: Diagnosis not present

## 2020-02-14 DIAGNOSIS — E7800 Pure hypercholesterolemia, unspecified: Secondary | ICD-10-CM | POA: Diagnosis not present

## 2020-02-14 DIAGNOSIS — Z23 Encounter for immunization: Secondary | ICD-10-CM | POA: Diagnosis not present

## 2020-02-16 DIAGNOSIS — Z951 Presence of aortocoronary bypass graft: Secondary | ICD-10-CM | POA: Diagnosis not present

## 2020-02-16 DIAGNOSIS — I252 Old myocardial infarction: Secondary | ICD-10-CM | POA: Diagnosis not present

## 2020-02-18 DIAGNOSIS — Z951 Presence of aortocoronary bypass graft: Secondary | ICD-10-CM | POA: Diagnosis not present

## 2020-02-18 DIAGNOSIS — I252 Old myocardial infarction: Secondary | ICD-10-CM | POA: Diagnosis not present

## 2020-02-28 DIAGNOSIS — N179 Acute kidney failure, unspecified: Secondary | ICD-10-CM | POA: Diagnosis not present

## 2020-02-28 DIAGNOSIS — R42 Dizziness and giddiness: Secondary | ICD-10-CM | POA: Diagnosis not present

## 2020-02-28 DIAGNOSIS — R531 Weakness: Secondary | ICD-10-CM | POA: Diagnosis not present

## 2020-02-28 DIAGNOSIS — E119 Type 2 diabetes mellitus without complications: Secondary | ICD-10-CM | POA: Diagnosis not present

## 2020-02-28 DIAGNOSIS — Z7902 Long term (current) use of antithrombotics/antiplatelets: Secondary | ICD-10-CM | POA: Diagnosis not present

## 2020-02-28 DIAGNOSIS — S2231XA Fracture of one rib, right side, initial encounter for closed fracture: Secondary | ICD-10-CM | POA: Diagnosis not present

## 2020-02-28 DIAGNOSIS — R0602 Shortness of breath: Secondary | ICD-10-CM | POA: Diagnosis not present

## 2020-02-28 DIAGNOSIS — I252 Old myocardial infarction: Secondary | ICD-10-CM | POA: Diagnosis not present

## 2020-02-28 DIAGNOSIS — J90 Pleural effusion, not elsewhere classified: Secondary | ICD-10-CM | POA: Diagnosis not present

## 2020-02-28 DIAGNOSIS — I361 Nonrheumatic tricuspid (valve) insufficiency: Secondary | ICD-10-CM | POA: Diagnosis not present

## 2020-02-28 DIAGNOSIS — Z7982 Long term (current) use of aspirin: Secondary | ICD-10-CM | POA: Diagnosis not present

## 2020-02-28 DIAGNOSIS — Z951 Presence of aortocoronary bypass graft: Secondary | ICD-10-CM | POA: Diagnosis not present

## 2020-02-28 DIAGNOSIS — Z794 Long term (current) use of insulin: Secondary | ICD-10-CM | POA: Diagnosis not present

## 2020-02-28 DIAGNOSIS — K449 Diaphragmatic hernia without obstruction or gangrene: Secondary | ICD-10-CM | POA: Diagnosis not present

## 2020-02-28 DIAGNOSIS — I951 Orthostatic hypotension: Secondary | ICD-10-CM | POA: Diagnosis not present

## 2020-02-28 DIAGNOSIS — R9431 Abnormal electrocardiogram [ECG] [EKG]: Secondary | ICD-10-CM | POA: Diagnosis not present

## 2020-02-28 DIAGNOSIS — Z79899 Other long term (current) drug therapy: Secondary | ICD-10-CM | POA: Diagnosis not present

## 2020-02-28 DIAGNOSIS — M160 Bilateral primary osteoarthritis of hip: Secondary | ICD-10-CM | POA: Diagnosis not present

## 2020-02-28 DIAGNOSIS — I2510 Atherosclerotic heart disease of native coronary artery without angina pectoris: Secondary | ICD-10-CM | POA: Diagnosis not present

## 2020-02-28 DIAGNOSIS — E785 Hyperlipidemia, unspecified: Secondary | ICD-10-CM | POA: Diagnosis not present

## 2020-02-28 DIAGNOSIS — R7989 Other specified abnormal findings of blood chemistry: Secondary | ICD-10-CM | POA: Diagnosis not present

## 2020-02-28 DIAGNOSIS — I10 Essential (primary) hypertension: Secondary | ICD-10-CM | POA: Diagnosis not present

## 2020-03-01 ENCOUNTER — Telehealth: Payer: Self-pay | Admitting: Cardiology

## 2020-03-01 NOTE — Telephone Encounter (Signed)
Left a message to return my call. Available appt on 02/01 at 11am in [facility] site 

## 2020-03-01 NOTE — Telephone Encounter (Signed)
Left a message to return my call. Available appt on 02/01 at 11am in [facility] site

## 2020-03-01 NOTE — Telephone Encounter (Signed)
New message:     Patient calling to get a f/u post hospital follow up and there isn't anything available.

## 2020-03-02 NOTE — Telephone Encounter (Signed)
Left another message to rerun my call.  

## 2020-03-03 DIAGNOSIS — I951 Orthostatic hypotension: Secondary | ICD-10-CM | POA: Diagnosis not present

## 2020-03-03 DIAGNOSIS — Z6821 Body mass index (BMI) 21.0-21.9, adult: Secondary | ICD-10-CM | POA: Diagnosis not present

## 2020-03-03 DIAGNOSIS — I2510 Atherosclerotic heart disease of native coronary artery without angina pectoris: Secondary | ICD-10-CM | POA: Diagnosis not present

## 2020-03-06 DIAGNOSIS — I252 Old myocardial infarction: Secondary | ICD-10-CM | POA: Diagnosis not present

## 2020-03-06 DIAGNOSIS — Z951 Presence of aortocoronary bypass graft: Secondary | ICD-10-CM | POA: Diagnosis not present

## 2020-03-15 ENCOUNTER — Other Ambulatory Visit: Payer: Self-pay | Admitting: Cardiology

## 2020-03-15 NOTE — Telephone Encounter (Signed)
Left another message to return my call.  

## 2020-03-15 NOTE — Telephone Encounter (Signed)
Refill for Clopidogrel 75 mg to pharmacy

## 2020-03-20 DIAGNOSIS — Z794 Long term (current) use of insulin: Secondary | ICD-10-CM | POA: Diagnosis not present

## 2020-03-20 DIAGNOSIS — E1065 Type 1 diabetes mellitus with hyperglycemia: Secondary | ICD-10-CM | POA: Diagnosis not present

## 2020-03-20 DIAGNOSIS — E1342 Other specified diabetes mellitus with diabetic polyneuropathy: Secondary | ICD-10-CM | POA: Diagnosis not present

## 2020-03-20 DIAGNOSIS — I25810 Atherosclerosis of coronary artery bypass graft(s) without angina pectoris: Secondary | ICD-10-CM | POA: Diagnosis not present

## 2020-04-06 ENCOUNTER — Other Ambulatory Visit: Payer: Self-pay | Admitting: Cardiology

## 2020-04-06 ENCOUNTER — Telehealth: Payer: Self-pay | Admitting: Cardiology

## 2020-04-06 MED ORDER — MIDODRINE HCL 5 MG PO TABS: ORAL_TABLET | ORAL | 1 refills | Status: DC

## 2020-04-06 NOTE — Telephone Encounter (Signed)
Called patient informed him we will refill midodrine and see him next week he understood no further questions.

## 2020-04-06 NOTE — Telephone Encounter (Signed)
Called patient he states he was in [facility] about a month ago and saw one of our doctors within our office. He states they ordered midodrine 5 mg (1/2 tablet 3 times daily) for him and told him to stop his amlodipine and metoprolol. I have him scheduled for a follow up visit next week, but he will run out of midodrine before then. He wants to know if Dr. Bing Matter can send him in a refill for this. Will consult with Dr. Bing Matter.

## 2020-04-06 NOTE — Telephone Encounter (Signed)
Pt needs refill on Midodrine 5 mg tablets. Uses [facility] on [address]  Urban Gibson number to contact pt- [phone]  Thank you!  Domingo Dimes

## 2020-04-06 NOTE — Telephone Encounter (Signed)
Yes, lets do refill on midodrine and please schedule follow-up appointment

## 2020-04-07 ENCOUNTER — Other Ambulatory Visit: Payer: Self-pay

## 2020-04-11 ENCOUNTER — Other Ambulatory Visit: Payer: Self-pay

## 2020-04-11 ENCOUNTER — Ambulatory Visit (INDEPENDENT_AMBULATORY_CARE_PROVIDER_SITE_OTHER): Payer: BC Managed Care – PPO | Admitting: Cardiology

## 2020-04-11 ENCOUNTER — Encounter: Payer: Self-pay | Admitting: Cardiology

## 2020-04-11 VITALS — BP 130/74 | HR 76 | Ht 64.0 in | Wt 131.0 lb

## 2020-04-11 DIAGNOSIS — E118 Type 2 diabetes mellitus with unspecified complications: Secondary | ICD-10-CM

## 2020-04-11 DIAGNOSIS — Z951 Presence of aortocoronary bypass graft: Secondary | ICD-10-CM

## 2020-04-11 DIAGNOSIS — Z794 Long term (current) use of insulin: Secondary | ICD-10-CM

## 2020-04-11 DIAGNOSIS — E785 Hyperlipidemia, unspecified: Secondary | ICD-10-CM | POA: Diagnosis not present

## 2020-04-11 DIAGNOSIS — E1169 Type 2 diabetes mellitus with other specified complication: Secondary | ICD-10-CM | POA: Diagnosis not present

## 2020-04-11 DIAGNOSIS — I10 Essential (primary) hypertension: Secondary | ICD-10-CM

## 2020-04-11 MED ORDER — METOPROLOL SUCCINATE ER 25 MG PO TB24: 25.0000 mg | ORAL_TABLET | Freq: Every day | ORAL | 1 refills | Status: DC

## 2020-04-11 NOTE — Patient Instructions (Signed)
Medication Instructions:  Your physician has recommended you make the following change in your medication:  START: Metoprolol succinate 25 mg daily   *If you need a refill on your cardiac medications before your next appointment, please call your pharmacy*   Lab Work: Your physician recommends that you return for lab work today: lipid  If you have labs (blood work) drawn today and your tests are completely normal, you will receive your results only by: Marland Kitchen MyChart Message (if you have MyChart) OR . A paper copy in the mail If you have any lab test that is abnormal or we need to change your treatment, we will call you to review the results.   Testing/Procedures: None   Follow-Up: At [facility], you and your health needs are our priority.  As part of our continuing mission to provide you with exceptional heart care, we have created designated Provider Care Teams.  These Care Teams include your primary Cardiologist (physician) and Advanced Practice Providers (APPs -  Physician Assistants and Nurse Practitioners) who all work together to provide you with the care you need, when you need it.  We recommend signing up for the patient portal called "MyChart".  Sign up information is provided on this After Visit Summary.  MyChart is used to connect with patients for Virtual Visits (Telemedicine).  Patients are able to view lab/test results, encounter notes, upcoming appointments, etc.  Non-urgent messages can be sent to your provider as well.   To learn more about what you can do with MyChart, go to [email or web address].    Your next appointment:   5 month(s)  The format for your next appointment:   In Person  Provider:   Gypsy Balsam, MD   Other Instructions

## 2020-04-11 NOTE — Progress Notes (Signed)
Cardiology Office Note:    Date:  04/11/2020   ID:  Jonathan Holder, DOB 02/04/1956, MRN [id number]  PCP:  Noni Saupe, MD  Cardiologist:  Gypsy Balsam, MD    Referring MD: Noni Saupe, MD   Chief Complaint  Patient presents with  . Follow-up  I am doing well  History of Present Illness:    Jonathan Holder is a 65 y.o. male with past medical history significant for coronary artery disease. He came to [facility] with episode of chest pain that he was transferred to [facility], cardiac catheterization was done which showed 90% mid LAD 90% intermediate branch 80% stenosis of the circumflex artery and 70% of right coronary artery. He was found to have ejection fraction 25 to 30%. He end up having coronary artery bypass graft with 5 grafts.  Recently he ended going to the hospital.  The reason for that was profound weakness and fatigue, he was find to be orthostatic as well is hypotensive.  Amlodipine as well as Toprol-XL has been withdrawn and since that time he is doing well.  He was also given prescription for midodrine.  He comes today to my office for follow-up.  He is doing very well.  He just denies having any chest pain tightness squeezing pressure burning chest.  He try to walk on the regular basis he goes on a walk every other day for about 20 minutes to half an hour have no difficulty doing it.  Past Medical History:  Diagnosis Date  . Anxiety   . Arthritis   . Depression   . Diabetes mellitus without complication (HCC)   . Essential hypertension 08/20/2019  . History of kidney stones   . HNP (herniated nucleus pulposus) with myelopathy, cervical 07/31/2016  . Hyperlipidemia associated with type 2 diabetes mellitus (HCC) 08/20/2019  . Hypertension   . 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 from [facility] 08/20/2019  . Multi-vessel coronary artery stenosis 08/20/2019  . 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 Subsequent ST elevation (STEMI)  myocardial infarction of anterior wall within 4 weeks of initial infarction (HCC) 08/20/2019  . Type 2 diabetes mellitus with complication, with long-term current use of insulin (HCC) 08/20/2019    Past Surgical History:  Procedure Laterality Date  . ANTERIOR CERVICAL DECOMP/DISCECTOMY FUSION N/A 07/31/2016   Procedure: ANTERIOR CERVICAL DECOMPRESSION/DISCECTOMY FUSION CERVICAL FOUR- CERVICAL FIVE, CERVICAL FIVE- CERVICAL SIX, CERVICAL SIX, CERVICAL SEVEN;  Surgeon: Shirlean Kelly, MD;  Location: [facility] OR;  Service: Neurosurgery;  Laterality: N/A;  . CORONARY ARTERY BYPASS GRAFT N/A 08/24/2019   Procedure: CORONARY ARTERY BYPASS GRAFTING (CABG), ON PUMP, TIMES FIVE, USING BILATERAL INTERNAL MAMMARY ARTERIES, ENDOSCOPICALLY HARVESTED RIGHT GREATER SAPHENOUS VEIN, AND OPEN LEFT RADIAL ARTERY HARVESTING;  Surgeon: Linden Dolin, MD;  Location: [facility] OR;  Service: Open Heart Surgery;  Laterality: N/A;  . DG THUMB RIGHT HAND ([facility] HX) Left    plate  . EYE SURGERY Bilateral    cataract removed  . LEFT HEART CATH AND CORONARY ANGIOGRAPHY N/A 08/20/2019   Procedure: LEFT HEART CATH AND CORONARY ANGIOGRAPHY;  Surgeon: Marykay Lex, MD;  Location: [facility] INVASIVE CV LAB;  Service: Cardiovascular;  Laterality: N/A;  . PENILE PROSTHESIS IMPLANT    . RADIAL ARTERY HARVEST Left 08/24/2019   Procedure: RADIAL ARTERY HARVEST;  Surgeon: Linden Dolin, MD;  Location: [facility] OR;  Service: Open Heart Surgery;  Laterality: Left;  . TEE WITHOUT CARDIOVERSION N/A 08/24/2019   Procedure: TRANSESOPHAGEAL  ECHOCARDIOGRAM (TEE);  Surgeon: Linden Dolin, MD;  Location: [facility] OR;  Service: Open Heart Surgery;  Laterality: N/A;    Current Medications: Current Meds  Medication Sig  . aspirin EC 81 MG EC tablet Take 1 tablet (81 mg total) by mouth daily. Swallow whole.  Marland Kitchen atorvastatin (LIPITOR) 40 MG tablet Take 40 mg by mouth daily.  . clopidogrel (PLAVIX) 75 MG tablet TAKE 1 TABLET(75 MG) BY MOUTH DAILY  . desvenlafaxine  (PRISTIQ) 50 MG 24 hr tablet Take 50 mg by mouth daily.  . famotidine (PEPCID) 40 MG tablet Take 40 mg by mouth daily.  Marland Kitchen gabapentin (NEURONTIN) 300 MG capsule Take 900 mg by mouth every evening.   . metoprolol succinate (TOPROL-XL) 25 MG 24 hr tablet Take 1 tablet (25 mg total) by mouth daily. Take with or immediately following a meal.  . midodrine (PROAMATINE) 5 MG tablet Take 1/2 a tablet 3 times a day  . NOVOLOG FLEXPEN 100 UNIT/ML FlexPen Inject 3 mLs into the skin daily.  . QUEtiapine (SEROQUEL) 100 MG tablet Take 100 mg by mouth at bedtime.  Evaristo Bury FLEXTOUCH 100 UNIT/ML FlexTouch Pen Inject 29 Units into the skin daily.   Marland Kitchen VRAYLAR capsule Take 3 mg by mouth at bedtime.     Allergies:   Patient has no known allergies.   Social History   Socioeconomic History  . Marital status: Married    Spouse name: Not on file  . Number of children: Not on file  . Years of education: Not on file  . Highest education level: Not on file  Occupational History  . Occupation: Production designer, theatre/television/film    Comment: [employer]  Tobacco Use  . Smoking status: Never Smoker  . Smokeless tobacco: Never Used  Vaping Use  . Vaping Use: Never used  Substance and Sexual Activity  . Alcohol use: No  . Drug use: No  . Sexual activity: Yes  Other Topics Concern  . Not on file  Social History Narrative   Works for [employer] business   Social Determinants of Health   Financial Resource Strain: Not on file  Food Insecurity: Not on file  Transportation Needs: Not on file  Physical Activity: Not on file  Stress: Not on file  Social Connections: Not on file     Family History: The patient's family history includes Dementia in his mother; Heart disease in his father. ROS:   Please see the history of present illness.    All 14 point review of systems negative except as described per history of present illness  EKGs/Labs/Other Studies Reviewed:      Recent Labs: 08/21/2019: ALT 9; B  Natriuretic Peptide 513.2 08/25/2019: Magnesium 2.1 09/14/2019: BUN 27; Creatinine, Ser 1.51; Hemoglobin 12.8; Platelets 379; Potassium 5.7; Sodium 141  Recent Lipid Panel    Component Value Date/Time   CHOL 144 01/05/2020 1619   TRIG 60 01/05/2020 1619   HDL 58 01/05/2020 1619   CHOLHDL 2.5 01/05/2020 1619   CHOLHDL 2.9 08/21/2019 0757   VLDL 8 08/21/2019 0757   LDLCALC 74 01/05/2020 1619    Physical Exam:    VS:  BP 130/74 (BP Location: Right Arm, Patient Position: Sitting)   Pulse 76   Ht 5\' 4"  (1.626 m)   Wt 131 lb (59.4 kg)   SpO2 98%   BMI 22.49 kg/m     Wt Readings from Last 3 Encounters:  04/11/20 131 lb (59.4 kg)  01/10/20 137 lb (62.1 kg)  01/05/20  137 lb 3.2 oz (62.2 kg)     GEN:  Well nourished, well developed in no acute distress HEENT: Normal NECK: No JVD; No carotid bruits LYMPHATICS: No lymphadenopathy CARDIAC: RRR, no murmurs, no rubs, no gallops RESPIRATORY:  Clear to auscultation without rales, wheezing or rhonchi  ABDOMEN: Soft, non-tender, non-distended MUSCULOSKELETAL:  No edema; No deformity  SKIN: Warm and dry LOWER EXTREMITIES: no swelling NEUROLOGIC:  Alert and oriented x 3 PSYCHIATRIC:  Normal affect   ASSESSMENT:    1. Hyperlipidemia associated with type 2 diabetes mellitus (HCC)   2. 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 Essential hypertension   4. Type 2 diabetes mellitus with complication, with long-term current use of insulin (HCC)    PLAN:    In order of problems listed above:  1. Coronary disease status post coronary artery bypass grafting July 2021.  Recovered nicely.  We will continue present management. 2. Essential hypertension blood pressure seems to be slightly on the higher side.  I will restart Toprol-XL.  It will be only 25 mg.  I warned him about potential side effect of this approach. 3. Type 2 diabetes I do not have his hemoglobin A1c but he tells me that he is doing well.  He is followed by internal medicine  team. 4. Orthostatic hypotension.  He is doing well from that point he does not have any signs and symptoms of it.  I will will keep him away from amlodipine, I will try to start Toprol-XL. 5. Cardiomyopathy his initial ejection fraction was 25% but latest echocardiogram done in the hospital just in the end of January showed normal left ventricle ejection fraction.  I will continue present management but I will ask long-term long-acting beta-blocker. 6. Overall think he is doing quite well.  I warned him of signs and symptoms of dehydration ask him to stay well-hydrated.  Ask him also to exercise on the regular basis.  I did review note from the hospital for this visit.  Medication Adjustments/Labs and Tests Ordered: Current medicines are reviewed at length with the patient today.  Concerns regarding medicines are outlined above.  Orders Placed This Encounter  Procedures  . Lipid panel  . EKG 12-Lead   Medication changes:  Meds ordered this encounter  Medications  . metoprolol succinate (TOPROL-XL) 25 MG 24 hr tablet    Sig: Take 1 tablet (25 mg total) by mouth daily. Take with or immediately following a meal.    Dispense:  90 tablet    Refill:  1    Signed, Georgeanna Lea, MD, [facility] 04/11/2020 8:26 AM    [facility]

## 2020-04-12 LAB — LIPID PANEL
Chol/HDL Ratio: 2.4 ratio (ref 0.0–5.0)
Cholesterol, Total: 143 mg/dL (ref 100–199)
HDL: 59 mg/dL (ref >39)
LDL Chol Calc (NIH): 70 mg/dL (ref 0–99)
Triglycerides: 72 mg/dL (ref 0–149)
VLDL Cholesterol Cal: 14 mg/dL (ref 5–40)

## 2020-04-18 ENCOUNTER — Telehealth: Payer: Self-pay

## 2020-04-18 NOTE — Telephone Encounter (Signed)
Patient notified of results.

## 2020-04-18 NOTE — Telephone Encounter (Signed)
-----   Message from Georgeanna Lea, MD sent at 04/12/2020  8:43 PM EST ----- Cholesterol normal, continue present management

## 2020-05-08 DIAGNOSIS — Z794 Long term (current) use of insulin: Secondary | ICD-10-CM | POA: Diagnosis not present

## 2020-05-08 DIAGNOSIS — I25810 Atherosclerosis of coronary artery bypass graft(s) without angina pectoris: Secondary | ICD-10-CM | POA: Diagnosis not present

## 2020-05-08 DIAGNOSIS — E1342 Other specified diabetes mellitus with diabetic polyneuropathy: Secondary | ICD-10-CM | POA: Diagnosis not present

## 2020-05-08 DIAGNOSIS — E1065 Type 1 diabetes mellitus with hyperglycemia: Secondary | ICD-10-CM | POA: Diagnosis not present

## 2020-05-09 DIAGNOSIS — F332 Major depressive disorder, recurrent severe without psychotic features: Secondary | ICD-10-CM | POA: Diagnosis not present

## 2020-06-09 ENCOUNTER — Other Ambulatory Visit: Payer: Self-pay | Admitting: Cardiology

## 2020-06-09 NOTE — Telephone Encounter (Signed)
Midodrine 5 mg # 45 x 3 refill sent to pharmacy

## 2020-06-14 ENCOUNTER — Ambulatory Visit: Payer: BC Managed Care – PPO | Admitting: Cardiology

## 2020-07-11 DIAGNOSIS — E1065 Type 1 diabetes mellitus with hyperglycemia: Secondary | ICD-10-CM | POA: Diagnosis not present

## 2020-07-11 DIAGNOSIS — I25810 Atherosclerosis of coronary artery bypass graft(s) without angina pectoris: Secondary | ICD-10-CM | POA: Diagnosis not present

## 2020-07-11 DIAGNOSIS — E1342 Other specified diabetes mellitus with diabetic polyneuropathy: Secondary | ICD-10-CM | POA: Diagnosis not present

## 2020-07-11 DIAGNOSIS — Z794 Long term (current) use of insulin: Secondary | ICD-10-CM | POA: Diagnosis not present

## 2020-07-11 DIAGNOSIS — Z23 Encounter for immunization: Secondary | ICD-10-CM | POA: Diagnosis not present

## 2020-08-23 DIAGNOSIS — I25810 Atherosclerosis of coronary artery bypass graft(s) without angina pectoris: Secondary | ICD-10-CM | POA: Diagnosis not present

## 2020-08-23 DIAGNOSIS — Z794 Long term (current) use of insulin: Secondary | ICD-10-CM | POA: Diagnosis not present

## 2020-08-23 DIAGNOSIS — E1065 Type 1 diabetes mellitus with hyperglycemia: Secondary | ICD-10-CM | POA: Diagnosis not present

## 2020-08-23 DIAGNOSIS — E1342 Other specified diabetes mellitus with diabetic polyneuropathy: Secondary | ICD-10-CM | POA: Diagnosis not present

## 2020-09-07 ENCOUNTER — Other Ambulatory Visit: Payer: Self-pay | Admitting: Cardiology

## 2020-09-18 DIAGNOSIS — Z794 Long term (current) use of insulin: Secondary | ICD-10-CM

## 2020-09-18 DIAGNOSIS — E785 Hyperlipidemia, unspecified: Secondary | ICD-10-CM

## 2020-09-18 DIAGNOSIS — E1142 Type 2 diabetes mellitus with diabetic polyneuropathy: Secondary | ICD-10-CM

## 2020-09-18 DIAGNOSIS — I25810 Atherosclerosis of coronary artery bypass graft(s) without angina pectoris: Secondary | ICD-10-CM

## 2020-09-18 DIAGNOSIS — E1065 Type 1 diabetes mellitus with hyperglycemia: Secondary | ICD-10-CM | POA: Insufficient documentation

## 2020-09-18 DIAGNOSIS — Z8639 Personal history of other endocrine, nutritional and metabolic disease: Secondary | ICD-10-CM

## 2020-09-18 DIAGNOSIS — Z9641 Presence of insulin pump (external) (internal): Secondary | ICD-10-CM | POA: Insufficient documentation

## 2020-09-18 HISTORY — DX: Long term (current) use of insulin: Z79.4

## 2020-09-18 HISTORY — DX: Type 1 diabetes mellitus with hyperglycemia: E10.65

## 2020-09-18 HISTORY — DX: Type 2 diabetes mellitus with diabetic polyneuropathy: E11.42

## 2020-09-18 HISTORY — DX: Presence of insulin pump (external) (internal): Z96.41

## 2020-09-18 HISTORY — DX: Personal history of other endocrine, nutritional and metabolic disease: Z86.39

## 2020-09-18 HISTORY — DX: Atherosclerosis of coronary artery bypass graft(s) without angina pectoris: I25.810

## 2020-09-18 HISTORY — DX: Hyperlipidemia, unspecified: E78.5

## 2020-09-19 ENCOUNTER — Encounter: Payer: Self-pay | Admitting: Cardiology

## 2020-09-19 ENCOUNTER — Other Ambulatory Visit: Payer: Self-pay

## 2020-09-19 ENCOUNTER — Ambulatory Visit (INDEPENDENT_AMBULATORY_CARE_PROVIDER_SITE_OTHER): Payer: BC Managed Care – PPO | Admitting: Cardiology

## 2020-09-19 VITALS — BP 118/74 | HR 55 | Ht 64.0 in | Wt 145.0 lb

## 2020-09-19 DIAGNOSIS — I255 Ischemic cardiomyopathy: Secondary | ICD-10-CM

## 2020-09-19 DIAGNOSIS — E1169 Type 2 diabetes mellitus with other specified complication: Secondary | ICD-10-CM | POA: Diagnosis not present

## 2020-09-19 DIAGNOSIS — E785 Hyperlipidemia, unspecified: Secondary | ICD-10-CM

## 2020-09-19 DIAGNOSIS — Z951 Presence of aortocoronary bypass graft: Secondary | ICD-10-CM | POA: Diagnosis not present

## 2020-09-19 DIAGNOSIS — I10 Essential (primary) hypertension: Secondary | ICD-10-CM | POA: Diagnosis not present

## 2020-09-19 HISTORY — DX: Ischemic cardiomyopathy: I25.5

## 2020-09-19 NOTE — Progress Notes (Signed)
Cardiology Office Note:    Date:  09/19/2020   ID:  Jonathan Holder, DOB [birth year], MRN [id number]  PCP:  Noni Saupe, MD  Cardiologist:  Gypsy Balsam, MD    Referring MD: Noni Saupe, MD   No chief complaint on file.   History of Present Illness:    Jonathan Holder is a 65 y.o. male with past medical history significant for coronary artery disease.  Year ago he ended up coming to [facility] with episode of chest pain after that he was transferred to [facility].  Cardiac catheterization has been performed which showed 90% mid LAD lesion, 90% intermediate branch lesion, 80% stenosis of the circumflex artery and 70% of right coronary artery.  At the same time he was find to have severe cardiomyopathy with ejection fraction of 25 to 30%.  He ended up having coronary artery bypass graft with 5 grafts.  Since that time he has been doing fine.  He had a one-time situation that he was orthostatic and hypotensive he ended up being back in [facility].  Echocardiogram done at that time showed preserved left ventricular ejection fraction but his medication has been modified and he has been doing well since that time. He is coming today to my office for regular follow-up.  Overall he is doing well.  He denies having any chest pain tightness squeezing pressure burning chest.  He continue to work.  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/24/2019   Subsequent ST elevation (STEMI) myocardial infarction of  anterior wall within 4 weeks of initial infarction (HCC) 08/20/2019   Type 2 diabetes mellitus with complication, with long-term current use of insulin (HCC) 08/20/2019    Past Surgical History:  Procedure Laterality Date   ANTERIOR CERVICAL DECOMP/DISCECTOMY FUSION N/A 07/31/2016   Procedure: ANTERIOR CERVICAL DECOMPRESSION/DISCECTOMY FUSION CERVICAL FOUR- CERVICAL FIVE, CERVICAL FIVE- CERVICAL SIX, CERVICAL SIX, CERVICAL SEVEN;  Surgeon: Shirlean Kelly, MD;  Location: [facility] OR;  Service: Neurosurgery;  Laterality: N/A;   CORONARY ARTERY BYPASS GRAFT N/A 08/24/2019   Procedure: CORONARY ARTERY BYPASS GRAFTING (CABG), ON PUMP, TIMES FIVE, USING BILATERAL INTERNAL MAMMARY ARTERIES, ENDOSCOPICALLY HARVESTED RIGHT GREATER SAPHENOUS VEIN, AND OPEN LEFT RADIAL ARTERY HARVESTING;  Surgeon: Linden Dolin, MD;  Location: [facility] OR;  Service: Open Heart Surgery;  Laterality: N/A;   DG THUMB RIGHT HAND ([facility] HX) Left    plate   EYE SURGERY Bilateral    cataract removed   LEFT HEART CATH AND CORONARY ANGIOGRAPHY N/A 08/20/2019   Procedure: LEFT HEART CATH AND CORONARY ANGIOGRAPHY;  Surgeon: Marykay Lex, MD;  Location: [facility] INVASIVE CV LAB;  Service: Cardiovascular;  Laterality: N/A;   PENILE PROSTHESIS IMPLANT     RADIAL ARTERY HARVEST Left 08/24/2019   Procedure: RADIAL ARTERY HARVEST;  Surgeon: Linden Dolin, MD;  Location: [facility] OR;  Service: Open Heart Surgery;  Laterality: Left;   TEE WITHOUT CARDIOVERSION N/A 08/24/2019   Procedure: TRANSESOPHAGEAL ECHOCARDIOGRAM (TEE);  Surgeon: Linden Dolin, MD;  Location:  [facility] OR;  Service: Open Heart Surgery;  Laterality: N/A;    Current Medications: Current Meds  Medication Sig   aspirin EC 81 MG EC tablet Take 1 tablet (81 mg total) by mouth daily. Swallow whole.   atorvastatin (LIPITOR) 40 MG tablet Take 40 mg by mouth daily.   clopidogrel (PLAVIX) 75 MG tablet TAKE 1 TABLET(75 MG) BY MOUTH DAILY (Patient taking differently: Take 75 mg by mouth daily.)    desvenlafaxine (PRISTIQ) 50 MG 24 hr tablet Take 50 mg by mouth daily.   famotidine (PEPCID) 40 MG tablet Take 40 mg by mouth daily.   gabapentin (NEURONTIN) 300 MG capsule Take 900 mg by mouth every evening.    metoprolol succinate (TOPROL-XL) 25 MG 24 hr tablet Take 1 tablet (25 mg total) by mouth daily. Take with or immediately following a meal.   midodrine (PROAMATINE) 5 MG tablet TAKE 1/2 TABLET BY MOUTH THREE TIMES DAILY (Patient taking differently: Take 2.5 mg by mouth 3 (three) times daily with meals. TAKE 1/2 TABLET BY MOUTH THREE TIMES DAILY)   NOVOLOG FLEXPEN 100 UNIT/ML FlexPen Inject 3 mLs into the skin daily.   TRESIBA FLEXTOUCH 100 UNIT/ML FlexTouch Pen Inject 29 Units into the skin daily.      Allergies:   Patient has no known allergies.   Social History   Socioeconomic History   Marital status: Married    Spouse name: Not on file   Number of children: Not on file   Years of education: Not on file   Highest education level: Not on file  Occupational History   Occupation: Production designer, theatre/television/film    Comment: restaurant [employer]  Tobacco Use   Smoking status: Never   Smokeless tobacco: Never  Vaping Use   Vaping Use: Never used  Substance and Sexual Activity   Alcohol use: No   Drug use: No   Sexual activity: Yes  Other Topics Concern   Not on file  Social History Narrative   Works for [employer]   Social Determinants of Health   Financial Resource Strain: Not on file  Food Insecurity: Not on file  Transportation Needs: Not on file  Physical Activity: Not on file  Stress: Not on file  Social Connections: Not on file     Family History: The patient's family history includes Dementia in his mother; Heart disease in his father. ROS:   Please see the history of present illness.    All 14 point review of systems negative except as described per history of present illness  EKGs/Labs/Other Studies Reviewed:      Recent Labs: No results found for  requested labs within last 8760 hours.  Recent Lipid Panel    Component Value Date/Time   CHOL 143 04/11/2020 0834   TRIG 72 04/11/2020 0834   HDL 59 04/11/2020 0834   CHOLHDL 2.4 04/11/2020 0834   CHOLHDL 2.9 08/21/2019 0757   VLDL 8 08/21/2019 0757   LDLCALC 70 04/11/2020 0834    Physical Exam:    VS:  BP 118/74 (BP Location: Left Arm, Patient Position: Sitting)   Pulse (!) 55   Ht 5\' 4"  (1.626 m)   Wt 145 lb (65.8 kg)   SpO2 99%   BMI 24.89 kg/m     Wt Readings from Last 3 Encounters:  09/19/20 145 lb (65.8 kg)  04/11/20 131 lb (59.4 kg)  01/10/20 137 lb (62.1 kg)     GEN:  Well nourished, well developed in no acute distress HEENT:  Normal NECK: No JVD; No carotid bruits LYMPHATICS: No lymphadenopathy CARDIAC: RRR, no murmurs, no rubs, no gallops RESPIRATORY:  Clear to auscultation without rales, wheezing or rhonchi  ABDOMEN: Soft, non-tender, non-distended MUSCULOSKELETAL:  No edema; No deformity  SKIN: Warm and dry LOWER EXTREMITIES: no swelling NEUROLOGIC:  Alert and oriented x 3 PSYCHIATRIC:  Normal affect   ASSESSMENT:    1. 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 Hyperlipidemia associated with type 2 diabetes mellitus (HCC)   3. Ischemic cardiomyopathy   4. Essential hypertension    PLAN:    In order of problems listed above:  Status post coronary artery bypass graft done about a year ago seems to be doing well.  He is asking me about continuation of dual antiplatelet therapy because of his risk factor as well as quite advanced coronary artery disease I think it would be reasonable to continue Plavix and aspirin and definitely.  I explained to him the rational for it and also told him about potential risk of this approach and ask him to let me know if he does not want to do that.  So far likely he has no complication of this complex therapy.  Hyperlipidemia did review his K PN which show me his LDL of 70 HDL 59 this is from 04/11/2020. Essential hypertension blood  pressure well controlled continue present management. Diabetes which been fairly poorly controlled initially but now he tells me that his hemoglobin A1c is 7.2 which is much better than before but still some room for improvement.  I try to encourage him to be able be more active and keep up with good diet.    Medication Adjustments/Labs and Tests Ordered: Current medicines are reviewed at length with the patient today.  Concerns regarding medicines are outlined above.  No orders of the defined types were placed in this encounter.  Medication changes: No orders of the defined types were placed in this encounter.   Signed, Georgeanna Lea, MD, [facility] 09/19/2020 3:45 PM    [facility]

## 2020-09-19 NOTE — Patient Instructions (Signed)

## 2020-10-07 ENCOUNTER — Other Ambulatory Visit: Payer: Self-pay | Admitting: Cardiology

## 2020-10-10 ENCOUNTER — Other Ambulatory Visit: Payer: Self-pay | Admitting: Cardiology

## 2020-10-10 NOTE — Telephone Encounter (Signed)
Clopidogrel 75 mg # 30 x 6 refills sent to : [facility] [phone] - [place], [place] - [address]

## 2020-10-24 DIAGNOSIS — L309 Dermatitis, unspecified: Secondary | ICD-10-CM | POA: Diagnosis not present

## 2020-11-02 ENCOUNTER — Other Ambulatory Visit: Payer: Self-pay | Admitting: Cardiology

## 2020-11-22 DIAGNOSIS — I25810 Atherosclerosis of coronary artery bypass graft(s) without angina pectoris: Secondary | ICD-10-CM | POA: Diagnosis not present

## 2020-11-22 DIAGNOSIS — E1342 Other specified diabetes mellitus with diabetic polyneuropathy: Secondary | ICD-10-CM | POA: Diagnosis not present

## 2020-11-22 DIAGNOSIS — E1065 Type 1 diabetes mellitus with hyperglycemia: Secondary | ICD-10-CM | POA: Diagnosis not present

## 2020-11-22 DIAGNOSIS — Z794 Long term (current) use of insulin: Secondary | ICD-10-CM | POA: Diagnosis not present

## 2020-12-06 ENCOUNTER — Other Ambulatory Visit: Payer: Self-pay | Admitting: Cardiology

## 2020-12-18 DIAGNOSIS — R5383 Other fatigue: Secondary | ICD-10-CM | POA: Diagnosis not present

## 2020-12-18 DIAGNOSIS — I2510 Atherosclerotic heart disease of native coronary artery without angina pectoris: Secondary | ICD-10-CM | POA: Diagnosis not present

## 2020-12-18 DIAGNOSIS — Z125 Encounter for screening for malignant neoplasm of prostate: Secondary | ICD-10-CM | POA: Diagnosis not present

## 2020-12-18 DIAGNOSIS — E7800 Pure hypercholesterolemia, unspecified: Secondary | ICD-10-CM | POA: Diagnosis not present

## 2020-12-18 DIAGNOSIS — Z6824 Body mass index (BMI) 24.0-24.9, adult: Secondary | ICD-10-CM | POA: Diagnosis not present

## 2020-12-27 DIAGNOSIS — F332 Major depressive disorder, recurrent severe without psychotic features: Secondary | ICD-10-CM | POA: Diagnosis not present

## 2021-01-02 ENCOUNTER — Telehealth: Payer: Self-pay

## 2021-01-02 NOTE — Telephone Encounter (Signed)
I have called patient to discuss recall colonoscopy. Patient does not wish to schedule at this time and when call back to schedule at a later date.

## 2021-01-24 DIAGNOSIS — F332 Major depressive disorder, recurrent severe without psychotic features: Secondary | ICD-10-CM | POA: Diagnosis not present

## 2021-01-31 ENCOUNTER — Other Ambulatory Visit: Payer: Self-pay | Admitting: Cardiology

## 2021-02-12 DIAGNOSIS — E1065 Type 1 diabetes mellitus with hyperglycemia: Secondary | ICD-10-CM | POA: Diagnosis not present

## 2021-03-01 ENCOUNTER — Other Ambulatory Visit: Payer: Self-pay | Admitting: Cardiology

## 2021-03-15 DIAGNOSIS — E1065 Type 1 diabetes mellitus with hyperglycemia: Secondary | ICD-10-CM | POA: Diagnosis not present

## 2021-03-26 DIAGNOSIS — E1065 Type 1 diabetes mellitus with hyperglycemia: Secondary | ICD-10-CM | POA: Diagnosis not present

## 2021-03-26 DIAGNOSIS — Z8639 Personal history of other endocrine, nutritional and metabolic disease: Secondary | ICD-10-CM | POA: Diagnosis not present

## 2021-03-26 DIAGNOSIS — Z794 Long term (current) use of insulin: Secondary | ICD-10-CM | POA: Diagnosis not present

## 2021-04-02 ENCOUNTER — Ambulatory Visit: Payer: Medicare Other | Admitting: Cardiology

## 2021-04-02 ENCOUNTER — Other Ambulatory Visit: Payer: Self-pay

## 2021-04-02 VITALS — BP 108/72 | HR 66 | Ht 65.0 in | Wt 148.8 lb

## 2021-04-02 DIAGNOSIS — I10 Essential (primary) hypertension: Secondary | ICD-10-CM | POA: Diagnosis not present

## 2021-04-02 DIAGNOSIS — I255 Ischemic cardiomyopathy: Secondary | ICD-10-CM

## 2021-04-02 DIAGNOSIS — Z951 Presence of aortocoronary bypass graft: Secondary | ICD-10-CM | POA: Diagnosis not present

## 2021-04-02 DIAGNOSIS — I2510 Atherosclerotic heart disease of native coronary artery without angina pectoris: Secondary | ICD-10-CM

## 2021-04-02 DIAGNOSIS — Z0000 Encounter for general adult medical examination without abnormal findings: Secondary | ICD-10-CM | POA: Diagnosis not present

## 2021-04-02 DIAGNOSIS — Z794 Long term (current) use of insulin: Secondary | ICD-10-CM

## 2021-04-02 DIAGNOSIS — Z6825 Body mass index (BMI) 25.0-25.9, adult: Secondary | ICD-10-CM | POA: Diagnosis not present

## 2021-04-02 DIAGNOSIS — E118 Type 2 diabetes mellitus with unspecified complications: Secondary | ICD-10-CM | POA: Diagnosis not present

## 2021-04-02 NOTE — Addendum Note (Signed)
Addended by: Roxanne Mins I on: 04/02/2021 09:56 AM   Modules accepted: Orders

## 2021-04-02 NOTE — Patient Instructions (Signed)
Medication Instructions:  °Your physician recommends that you continue on your current medications as directed. Please refer to the Current Medication list given to you today. ° °*If you need a refill on your cardiac medications before your next appointment, please call your pharmacy* ° ° °Lab Work: °None °If you have labs (blood work) drawn today and your tests are completely normal, you will receive your results only by: °MyChart Message (if you have MyChart) OR °A paper copy in the mail °If you have any lab test that is abnormal or we need to change your treatment, we will call you to review the results. ° ° °Testing/Procedures: °Your physician has requested that you have an echocardiogram. Echocardiography is a painless test that uses sound waves to create images of your heart. It provides your doctor with information about the size and shape of your heart and how well your heart’s chambers and valves are working. This procedure takes approximately one hour. There are no restrictions for this procedure. ° ° ° °Follow-Up: °At [facility], you and your health needs are our priority.  As part of our continuing mission to provide you with exceptional heart care, we have created designated Provider Care Teams.  These Care Teams include your primary Cardiologist (physician) and Advanced Practice Providers (APPs -  Physician Assistants and Nurse Practitioners) who all work together to provide you with the care you need, when you need it. ° °We recommend signing up for the patient portal called "MyChart".  Sign up information is provided on this After Visit Summary.  MyChart is used to connect with patients for Virtual Visits (Telemedicine).  Patients are able to view lab/test results, encounter notes, upcoming appointments, etc.  Non-urgent messages can be sent to your provider as well.   °To learn more about what you can do with MyChart, go to [email or web address].   ° °Your next appointment:   °3  month(s) ° °The format for your next appointment:   °In Person ° °Provider:   °Robert Krasowski, MD  ° ° °Other Instructions °None ° °

## 2021-04-02 NOTE — Progress Notes (Signed)
Cardiology Office Note:    Date:  04/02/2021   ID:  Jonathan Holder, DOB 12-16-1955, MRN [id number]  PCP:  Angelina Sheriff, MD  Cardiologist:  Jenne Campus, MD    Referring MD: Angelina Sheriff, MD   Chief Complaint  Patient presents with   Follow-up  I am very weak and tired  History of Present Illness:    Jonathan Holder is a 66 y.o. male with past medical history significant for coronary artery disease.  In 2021 he end up coming to our hospital with episode of chest pain and then he was transferred to [facility], cardiac catheterization has been performed which showed 90% mid LAD, 90% intermediate branch, 80% stenosis of the circumflex artery as well as 70% stenosis of right coronary artery.  At that time he was find to have severe ischemic cardiomyopathy ejection fraction 25 to 30%.  He did have coronary artery bypass graft on 5 grafts.  Since that time he has been doing quite well.  His ejection fraction improved to 45 to 50%.  However he does have episodes of orthostatic hypotension. He comes today to my office for follow-up overall he is doing poorly he said he is weak tired exhausted he does not have any energy he did cannot do anything.  Denies have any chest pain tightness squeezing pressure mid chest no shortness of breath.  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/24/2019   Subsequent ST elevation (STEMI) myocardial infarction of anterior wall within 4 weeks of initial infarction ([place]) 08/20/2019   Type 2  diabetes mellitus with complication, with long-term current use of insulin ([place]) 08/20/2019    Past Surgical History:  Procedure Laterality Date   ANTERIOR CERVICAL DECOMP/DISCECTOMY FUSION N/A 07/31/2016   Procedure: ANTERIOR CERVICAL DECOMPRESSION/DISCECTOMY FUSION CERVICAL FOUR- CERVICAL FIVE, CERVICAL FIVE- CERVICAL SIX, CERVICAL SIX, CERVICAL SEVEN;  Surgeon: Jovita Gamma, MD;  Location: [place];  Service: Neurosurgery;  Laterality: N/A;   COLONOSCOPY  04/13/2019   Dr. Lyda Jester, [place]. Normal Colonoscopy to the terminal ileum but with a poor prep.   CORONARY ARTERY BYPASS GRAFT N/A 08/24/2019   Procedure: CORONARY ARTERY BYPASS GRAFTING (CABG), ON PUMP, TIMES FIVE, USING BILATERAL INTERNAL MAMMARY ARTERIES, ENDOSCOPICALLY HARVESTED RIGHT GREATER SAPHENOUS VEIN, AND OPEN LEFT RADIAL ARTERY HARVESTING;  Surgeon: Wonda Olds, MD;  Location: [place];  Service: Open Heart Surgery;  Laterality: N/A;   DG THUMB RIGHT HAND ([facility] HX) Left    plate   ESOPHAGOGASTRODUODENOSCOPY  04/13/2019   Dr. Lyda Jester, [place]. Mild Scattered gastritis. Small amount of food in the fundus   EYE SURGERY Bilateral    cataract removed   LEFT HEART CATH AND CORONARY ANGIOGRAPHY N/A 08/20/2019   Procedure: LEFT HEART CATH AND CORONARY ANGIOGRAPHY;  Surgeon: Leonie Man, MD;  Location: [place] [facility];  Service: Cardiovascular;  Laterality: N/A;   PENILE PROSTHESIS IMPLANT     RADIAL ARTERY HARVEST Left 08/24/2019   Procedure: RADIAL ARTERY HARVEST;  Surgeon: Fredrich Romans  Z, MD;  Location: [place];  Service: Open Heart Surgery;  Laterality: Left;   TEE WITHOUT CARDIOVERSION N/A 08/24/2019   Procedure: TRANSESOPHAGEAL ECHOCARDIOGRAM (TEE);  Surgeon: Wonda Olds, MD;  Location: [place];  Service: Open Heart Surgery;  Laterality: N/A;    Current Medications: Current Meds  Medication Sig   aspirin EC 81 MG EC tablet Take 1 tablet (81 mg total) by mouth daily. Swallow whole.    atorvastatin (LIPITOR) 40 MG tablet Take 40 mg by mouth daily.   clopidogrel (PLAVIX) 75 MG tablet TAKE 1 TABLET(75 MG) BY MOUTH DAILY (Patient taking differently: Take 75 mg by mouth daily.)   desvenlafaxine (PRISTIQ) 50 MG 24 hr tablet Take 100 mg by mouth daily.   famotidine (PEPCID) 40 MG tablet Take 40 mg by mouth daily.   gabapentin (NEURONTIN) 300 MG capsule Take 900 mg by mouth every evening.    metoprolol succinate (TOPROL-XL) 25 MG 24 hr tablet TAKE 1 TABLET(25 MG) BY MOUTH DAILY WITH OR IMMEDIATELY FOLLOWING A MEAL (Patient taking differently: Take 25 mg by mouth daily.)   midodrine (PROAMATINE) 5 MG tablet TAKE 1/2 TABLET BY MOUTH THREE TIMES DAILY (Patient taking differently: Take 2.5 mg by mouth 3 (three) times daily with meals. TAKE 1/2 TABLET BY MOUTH THREE TIMES DAILY)   Multiple Vitamins-Minerals (PRESERVISION AREDS 2 PO) Take 1 tablet by mouth 2 (two) times daily.   NOVOLOG FLEXPEN 100 UNIT/ML FlexPen Inject 3 mLs into the skin See admin instructions. 1 unit per every 15 carbohydrates   QUEtiapine (SEROQUEL) 100 MG tablet Take 200 mg by mouth at bedtime.   TRESIBA FLEXTOUCH 100 UNIT/ML FlexTouch Pen Inject 13 Units into the skin daily.     Allergies:   Patient has no known allergies.   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 ROS:   Please see the history of present illness.    All 14 point review of systems negative except as described per history of present illness  EKGs/Labs/Other Studies Reviewed:      Recent Labs: No results found for requested labs within last 8760 hours.  Recent Lipid Panel    Component Value Date/Time   CHOL 143 04/11/2020 0834   TRIG 72 04/11/2020 0834   HDL 59 04/11/2020 0834   CHOLHDL 2.4 04/11/2020 0834   CHOLHDL 2.9 08/21/2019 0757   VLDL 8 08/21/2019 0757   LDLCALC 70 04/11/2020 0834    Physical Exam:    VS:  BP  108/72 (BP Location: Right Arm, Patient Position: Sitting)    Pulse 66    Ht 5\' 5"  (1.651 m)    Wt 148 lb 12.8 oz (67.5 kg)    SpO2 96%    BMI 24.76 kg/m     Wt Readings from Last 3 Encounters:  04/02/21 148 lb 12.8 oz (67.5 kg)  09/19/20 145 lb (65.8 kg)  04/11/20 131 lb (59.4 kg)     GEN:  Well nourished, well developed in no acute distress HEENT: Normal NECK: No JVD; No carotid bruits LYMPHATICS: No lymphadenopathy CARDIAC: RRR, no murmurs, no rubs, no gallops RESPIRATORY:  Clear to auscultation without rales, wheezing or rhonchi  ABDOMEN: Soft, non-tender, non-distended MUSCULOSKELETAL:  No edema; No deformity  SKIN: Warm and dry LOWER EXTREMITIES: no swelling NEUROLOGIC:  Alert and oriented x 3 PSYCHIATRIC:  Normal affect   ASSESSMENT:    1. 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 Type 2 diabetes mellitus with complication, with long-term current use of insulin ([place])   3. Multi-vessel coronary artery stenosis   4. Essential hypertension   5. Ischemic cardiomyopathy    PLAN:    In order of problems listed above:  Status post coronary bypass graft doing well from that point review denies having any  symptomatology that would suggest reactivation of the problem.  Chief complaint today is weakness fatigue and tiredness.  I will ask him to have another echocardiogram to check left ventricle ejection fraction.  We will do EKG as well.  He does have appointment with his primary care physician today and I think will be reasonable to check a CBC complete metabolic panel as well as TSH and fasting lipid profile. Type 2 diabetes I do see last hemoglobin A1c from 12/18/2020 which was 7.5.  Slightly on the higher side.  Need to be better controlled. Essential hypertension his blood pressure is actually low and I suspect hypertension being contributing factor to his symptomatology.  I will ask him to stop his metoprolol. History of ischemic cardiomyopathy again echocardiogram will be repeated to recheck left ventricle ejection fraction.   Medication Adjustments/Labs and Tests Ordered: Current medicines are reviewed at length with the patient today.  Concerns regarding medicines are outlined above.  No orders of the defined types were placed in this encounter.  Medication changes: No orders of the defined types were placed in this encounter.   Signed, Park Liter, MD, [facility] 04/02/2021 9:41 AM    [place]

## 2021-04-03 ENCOUNTER — Other Ambulatory Visit: Payer: Medicare Other

## 2021-04-03 DIAGNOSIS — I252 Old myocardial infarction: Secondary | ICD-10-CM | POA: Diagnosis not present

## 2021-04-03 DIAGNOSIS — E119 Type 2 diabetes mellitus without complications: Secondary | ICD-10-CM | POA: Diagnosis not present

## 2021-04-03 DIAGNOSIS — S91114A Laceration without foreign body of right lesser toe(s) without damage to nail, initial encounter: Secondary | ICD-10-CM | POA: Diagnosis not present

## 2021-04-03 DIAGNOSIS — M160 Bilateral primary osteoarthritis of hip: Secondary | ICD-10-CM | POA: Diagnosis not present

## 2021-04-03 DIAGNOSIS — I129 Hypertensive chronic kidney disease with stage 1 through stage 4 chronic kidney disease, or unspecified chronic kidney disease: Secondary | ICD-10-CM | POA: Diagnosis not present

## 2021-04-03 DIAGNOSIS — Z794 Long term (current) use of insulin: Secondary | ICD-10-CM | POA: Diagnosis not present

## 2021-04-03 DIAGNOSIS — E1165 Type 2 diabetes mellitus with hyperglycemia: Secondary | ICD-10-CM | POA: Diagnosis not present

## 2021-04-03 DIAGNOSIS — Z79899 Other long term (current) drug therapy: Secondary | ICD-10-CM | POA: Diagnosis not present

## 2021-04-03 DIAGNOSIS — I951 Orthostatic hypotension: Secondary | ICD-10-CM | POA: Diagnosis not present

## 2021-04-03 DIAGNOSIS — R42 Dizziness and giddiness: Secondary | ICD-10-CM | POA: Diagnosis not present

## 2021-04-03 DIAGNOSIS — Z7901 Long term (current) use of anticoagulants: Secondary | ICD-10-CM | POA: Diagnosis not present

## 2021-04-03 DIAGNOSIS — R55 Syncope and collapse: Secondary | ICD-10-CM | POA: Diagnosis not present

## 2021-04-03 DIAGNOSIS — I9589 Other hypotension: Secondary | ICD-10-CM | POA: Diagnosis not present

## 2021-04-03 DIAGNOSIS — F419 Anxiety disorder, unspecified: Secondary | ICD-10-CM | POA: Diagnosis not present

## 2021-04-03 DIAGNOSIS — I2510 Atherosclerotic heart disease of native coronary artery without angina pectoris: Secondary | ICD-10-CM | POA: Diagnosis not present

## 2021-04-03 DIAGNOSIS — S92514B Nondisplaced fracture of proximal phalanx of right lesser toe(s), initial encounter for open fracture: Secondary | ICD-10-CM | POA: Diagnosis not present

## 2021-04-03 DIAGNOSIS — Z7982 Long term (current) use of aspirin: Secondary | ICD-10-CM | POA: Diagnosis not present

## 2021-04-03 DIAGNOSIS — E1122 Type 2 diabetes mellitus with diabetic chronic kidney disease: Secondary | ICD-10-CM | POA: Diagnosis not present

## 2021-04-03 DIAGNOSIS — N189 Chronic kidney disease, unspecified: Secondary | ICD-10-CM | POA: Diagnosis not present

## 2021-04-03 DIAGNOSIS — S92514A Nondisplaced fracture of proximal phalanx of right lesser toe(s), initial encounter for closed fracture: Secondary | ICD-10-CM | POA: Diagnosis not present

## 2021-04-03 DIAGNOSIS — F319 Bipolar disorder, unspecified: Secondary | ICD-10-CM | POA: Diagnosis not present

## 2021-04-03 DIAGNOSIS — Z792 Long term (current) use of antibiotics: Secondary | ICD-10-CM | POA: Diagnosis not present

## 2021-04-03 DIAGNOSIS — M7989 Other specified soft tissue disorders: Secondary | ICD-10-CM | POA: Diagnosis not present

## 2021-04-04 DIAGNOSIS — E785 Hyperlipidemia, unspecified: Secondary | ICD-10-CM

## 2021-04-04 DIAGNOSIS — E119 Type 2 diabetes mellitus without complications: Secondary | ICD-10-CM | POA: Diagnosis not present

## 2021-04-04 DIAGNOSIS — I951 Orthostatic hypotension: Secondary | ICD-10-CM | POA: Diagnosis not present

## 2021-04-04 DIAGNOSIS — I10 Essential (primary) hypertension: Secondary | ICD-10-CM

## 2021-04-04 DIAGNOSIS — R55 Syncope and collapse: Secondary | ICD-10-CM | POA: Diagnosis not present

## 2021-04-04 DIAGNOSIS — I2510 Atherosclerotic heart disease of native coronary artery without angina pectoris: Secondary | ICD-10-CM | POA: Diagnosis not present

## 2021-04-10 DIAGNOSIS — D510 Vitamin B12 deficiency anemia due to intrinsic factor deficiency: Secondary | ICD-10-CM | POA: Diagnosis not present

## 2021-04-12 ENCOUNTER — Other Ambulatory Visit: Payer: Self-pay | Admitting: Cardiology

## 2021-04-12 DIAGNOSIS — E1065 Type 1 diabetes mellitus with hyperglycemia: Secondary | ICD-10-CM | POA: Diagnosis not present

## 2021-04-16 DIAGNOSIS — S92919A Unspecified fracture of unspecified toe(s), initial encounter for closed fracture: Secondary | ICD-10-CM | POA: Diagnosis not present

## 2021-04-22 DIAGNOSIS — L03115 Cellulitis of right lower limb: Secondary | ICD-10-CM | POA: Diagnosis not present

## 2021-04-22 DIAGNOSIS — J988 Other specified respiratory disorders: Secondary | ICD-10-CM | POA: Diagnosis not present

## 2021-04-22 DIAGNOSIS — R051 Acute cough: Secondary | ICD-10-CM | POA: Diagnosis not present

## 2021-04-30 DIAGNOSIS — S92514A Nondisplaced fracture of proximal phalanx of right lesser toe(s), initial encounter for closed fracture: Secondary | ICD-10-CM | POA: Diagnosis not present

## 2021-04-30 DIAGNOSIS — M19071 Primary osteoarthritis, right ankle and foot: Secondary | ICD-10-CM | POA: Diagnosis not present

## 2021-04-30 DIAGNOSIS — S92514B Nondisplaced fracture of proximal phalanx of right lesser toe(s), initial encounter for open fracture: Secondary | ICD-10-CM | POA: Diagnosis not present

## 2021-05-02 ENCOUNTER — Ambulatory Visit (INDEPENDENT_AMBULATORY_CARE_PROVIDER_SITE_OTHER): Payer: Medicare Other | Admitting: Podiatry

## 2021-05-02 ENCOUNTER — Ambulatory Visit: Payer: Medicare Other | Admitting: Podiatry

## 2021-05-02 ENCOUNTER — Ambulatory Visit (INDEPENDENT_AMBULATORY_CARE_PROVIDER_SITE_OTHER): Payer: Medicare Other

## 2021-05-02 DIAGNOSIS — M79671 Pain in right foot: Secondary | ICD-10-CM

## 2021-05-02 DIAGNOSIS — L97519 Non-pressure chronic ulcer of other part of right foot with unspecified severity: Secondary | ICD-10-CM | POA: Diagnosis not present

## 2021-05-02 DIAGNOSIS — E11621 Type 2 diabetes mellitus with foot ulcer: Secondary | ICD-10-CM

## 2021-05-02 MED ORDER — SULFAMETHOXAZOLE-TRIMETHOPRIM 800-160 MG PO TABS: 1.0000 | ORAL_TABLET | Freq: Two times a day (BID) | ORAL | 0 refills | Status: DC

## 2021-05-03 ENCOUNTER — Encounter: Payer: Self-pay | Admitting: Podiatry

## 2021-05-03 DIAGNOSIS — L97512 Non-pressure chronic ulcer of other part of right foot with fat layer exposed: Secondary | ICD-10-CM | POA: Diagnosis not present

## 2021-05-03 DIAGNOSIS — E11621 Type 2 diabetes mellitus with foot ulcer: Secondary | ICD-10-CM | POA: Diagnosis not present

## 2021-05-03 NOTE — Progress Notes (Signed)
Error

## 2021-05-03 NOTE — Progress Notes (Signed)
Spoke with patient. Reviewed MRI results. No immediate need for surgery based on MRI. He is following up with a wound care specialist in [place], [place] today. Return to clinic PRN. - Dr. Logan Bores

## 2021-05-06 NOTE — Progress Notes (Signed)
? ?Subjective:  ?66 y.o. male with PMHx of diabetes mellitus presenting with his wife as a new patient for evaluation of redness and swelling to the right fifth toe.  Patient has been treated by the [facility] orthopedic screw in [place].  Apparently the wound continued to deteriorate and MRI was ordered.  The results are available through [facility] health MRI.  Unfortunately we do not have immediate access to the results. Patient also states that he is currently taking Bactrim DS.  He says that he was referred here from the [facility] orthopedic screw.   ? ?Patient states that this all began when he injured his toes and sustained fractures to the right fifth digit about 6 weeks ago when he stepped down from a ladder.  He says he created a laceration when he injured the toes and the laceration was sutured.  He continued to have deterioration and redness and swelling and wound development of the toe ever since the injury.  He presents for further treatment and evaluation ? ? ?Past Medical History:  ?Diagnosis Date  ? Anxiety   ? Arthritis   ? Depression   ? Diabetes mellitus without complication ([facility])   ? Essential hypertension 08/20/2019  ? History of kidney stones   ? HNP (herniated nucleus pulposus) with myelopathy, cervical 07/31/2016  ? Hyperlipidemia associated with type 2 diabetes mellitus ([facility]) 08/20/2019  ? Hypertension   ? 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 from [facility] 08/20/2019  ? Multi-vessel coronary artery stenosis 08/20/2019  ? 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 Subsequent ST elevation (STEMI) myocardial infarction of anterior wall within 4 weeks of initial infarction ([facility]) 08/20/2019  ? Type 2 diabetes mellitus with complication, with long-term current use of insulin ([facility]) 08/20/2019  ? ? ?  ?Objective/Physical Exam ?General: The patient is alert and oriented x3 in no acute distress. ? ?Dermatology:  ?Wound #1 noted to the interdigital webspace of the right fifth toe adjacent to  the fourth toe measuring approximately 0.5 x 0.5 x 0.2 cm (LxWxD).  Patient is very sensate and is painful to light touch or palpation ? ?To the noted ulceration(s), there is no eschar. There is a moderate amount of slough, fibrin, and necrotic tissue noted. Granulation tissue and wound base is red. There is a minimal amount of serosanguineous drainage noted. There is no exposed bone muscle-tendon ligament or joint. There is no malodor. Periwound integrity is intact. ?Skin is warm, dry and supple bilateral lower extremities. ? ?Vascular: Palpable pedal pulses bilaterally.  Erythema with edema noted to the right fifth toe ? ?Neurological: Epicritic and protective threshold diminished bilaterally.  ? ?Musculoskeletal Exam: No prior amputations or pedal deformities noted ? ?Assessment: ?1.  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 diabetes mellitus w/ peripheral neuropathy ? ? ?Plan of Care:  ?1. Patient was evaluated. ?2.  Explained to the patient that it is imperative that we get the MRI results of the right fifth toe to determine whether or not there is any osteomyelitis.  MRI results are pending availability here in our office. ?3.  In the meantime continue postsurgical shoe ?4.  Recommend Betadine daily to the wound ?5.  Refill prescription for Bactrim DS ?6.  I informed the patient and his spouse that I will contact him once MRI results are available to discuss next treatment options.  ? ? ?Felecia Shelling, DPM ?[facility] ? ?Dr. Felecia Shelling, DPM  ?  ?[phone]  [address]                                        ?[place], [place] [address]                ?Office [phone]  ?Fax [phone] ? ? ? ? ?

## 2021-05-07 ENCOUNTER — Ambulatory Visit: Payer: Medicare Other | Admitting: Podiatry

## 2021-05-07 DIAGNOSIS — E11621 Type 2 diabetes mellitus with foot ulcer: Secondary | ICD-10-CM | POA: Diagnosis not present

## 2021-05-08 ENCOUNTER — Telehealth: Payer: Self-pay | Admitting: Cardiology

## 2021-05-08 ENCOUNTER — Encounter: Payer: Self-pay | Admitting: Cardiology

## 2021-05-08 DIAGNOSIS — Z794 Long term (current) use of insulin: Secondary | ICD-10-CM | POA: Diagnosis not present

## 2021-05-08 DIAGNOSIS — Z8639 Personal history of other endocrine, nutritional and metabolic disease: Secondary | ICD-10-CM | POA: Diagnosis not present

## 2021-05-08 DIAGNOSIS — I959 Hypotension, unspecified: Secondary | ICD-10-CM | POA: Diagnosis not present

## 2021-05-08 DIAGNOSIS — E1065 Type 1 diabetes mellitus with hyperglycemia: Secondary | ICD-10-CM | POA: Diagnosis not present

## 2021-05-08 NOTE — Telephone Encounter (Signed)
Error

## 2021-05-08 NOTE — Telephone Encounter (Signed)
Pt c/o BP issue: STAT if pt c/o blurred vision, one-sided weakness or slurred speech ? ?1. What are your last 5 BP readings? 80/50, 87/52 ? ?2. Are you having any other symptoms (ex. Dizziness, headache, blurred vision, passed out)? Fatigue and lightheadedness ? ?3. What is your BP issue? Merleen Nicely, pharmacist with [facility], states the patient's BP is very low today. He would like to know if the patient can be worked in today. He says the patient is in [place] right now. He says the patient is fatigued and feels lightheaded but stated he felt okay to drive. Phone: [phone], nurse extension: [phone], his extension: [phone] ? ?

## 2021-05-08 NOTE — Telephone Encounter (Signed)
Spoke with Adelina Mings and advised pt needs to go to the ED for evaluation and possible fluids with sx and low BP. Pt should not be driving with dizziness and lightlessness. Adelina Mings was asking for an appt with [facility] in [place] but was advised we cannot schedule an appt in that office. Adelina Mings states that he will advise pt to go to the ED. ?

## 2021-05-08 NOTE — Telephone Encounter (Signed)
Patient is calling back in regards to this requesting a sooner appt. He did not go to the ED due to eating some crackers at the doctor's office and feeling better with his BP getting back up to 120/something.  ?

## 2021-05-09 ENCOUNTER — Encounter: Payer: Self-pay | Admitting: Gastroenterology

## 2021-05-09 DIAGNOSIS — Z7982 Long term (current) use of aspirin: Secondary | ICD-10-CM | POA: Diagnosis not present

## 2021-05-09 DIAGNOSIS — Z87442 Personal history of urinary calculi: Secondary | ICD-10-CM | POA: Diagnosis not present

## 2021-05-09 DIAGNOSIS — Z794 Long term (current) use of insulin: Secondary | ICD-10-CM | POA: Diagnosis not present

## 2021-05-09 DIAGNOSIS — I2510 Atherosclerotic heart disease of native coronary artery without angina pectoris: Secondary | ICD-10-CM | POA: Diagnosis not present

## 2021-05-09 DIAGNOSIS — R531 Weakness: Secondary | ICD-10-CM | POA: Diagnosis not present

## 2021-05-09 DIAGNOSIS — I129 Hypertensive chronic kidney disease with stage 1 through stage 4 chronic kidney disease, or unspecified chronic kidney disease: Secondary | ICD-10-CM | POA: Diagnosis not present

## 2021-05-09 DIAGNOSIS — I252 Old myocardial infarction: Secondary | ICD-10-CM | POA: Diagnosis not present

## 2021-05-09 DIAGNOSIS — E1122 Type 2 diabetes mellitus with diabetic chronic kidney disease: Secondary | ICD-10-CM | POA: Diagnosis not present

## 2021-05-09 DIAGNOSIS — I959 Hypotension, unspecified: Secondary | ICD-10-CM | POA: Diagnosis not present

## 2021-05-09 DIAGNOSIS — Z792 Long term (current) use of antibiotics: Secondary | ICD-10-CM | POA: Diagnosis not present

## 2021-05-09 DIAGNOSIS — E875 Hyperkalemia: Secondary | ICD-10-CM | POA: Diagnosis not present

## 2021-05-09 DIAGNOSIS — Z91148 Patient's other noncompliance with medication regimen for other reason: Secondary | ICD-10-CM | POA: Diagnosis not present

## 2021-05-09 DIAGNOSIS — R112 Nausea with vomiting, unspecified: Secondary | ICD-10-CM | POA: Diagnosis not present

## 2021-05-09 DIAGNOSIS — N179 Acute kidney failure, unspecified: Secondary | ICD-10-CM | POA: Diagnosis not present

## 2021-05-09 DIAGNOSIS — R42 Dizziness and giddiness: Secondary | ICD-10-CM | POA: Diagnosis not present

## 2021-05-09 DIAGNOSIS — F419 Anxiety disorder, unspecified: Secondary | ICD-10-CM | POA: Diagnosis not present

## 2021-05-09 DIAGNOSIS — Z951 Presence of aortocoronary bypass graft: Secondary | ICD-10-CM | POA: Diagnosis not present

## 2021-05-09 DIAGNOSIS — F319 Bipolar disorder, unspecified: Secondary | ICD-10-CM | POA: Diagnosis not present

## 2021-05-09 DIAGNOSIS — I951 Orthostatic hypotension: Secondary | ICD-10-CM | POA: Diagnosis not present

## 2021-05-09 DIAGNOSIS — N183 Chronic kidney disease, stage 3 unspecified: Secondary | ICD-10-CM | POA: Diagnosis not present

## 2021-05-09 NOTE — Telephone Encounter (Signed)
Called patient and asked him if he went to the ER and what the outcome was at the ER. Patient stated that he did not go to the ER. At his PCP office the patient stated that they gave him saltine crackers to eat to help raise his blood pressure. He stated that his blood pressure was usually low in the morning. I had him check his blood pressure this morning and it was 68/42. Patient stated he was a little light headed and dizzy. Due to these symptoms I advise him to go to the ER to be evaluated. I also advised him to have someone drive him to the ER as it was unsafe, with his symptoms to drive by himself. Patient said "Thank You" and hung up the phone. ?

## 2021-05-10 DIAGNOSIS — I951 Orthostatic hypotension: Secondary | ICD-10-CM | POA: Diagnosis not present

## 2021-05-10 DIAGNOSIS — E875 Hyperkalemia: Secondary | ICD-10-CM | POA: Diagnosis not present

## 2021-05-10 DIAGNOSIS — N179 Acute kidney failure, unspecified: Secondary | ICD-10-CM | POA: Diagnosis not present

## 2021-05-13 DIAGNOSIS — E1065 Type 1 diabetes mellitus with hyperglycemia: Secondary | ICD-10-CM | POA: Diagnosis not present

## 2021-05-16 DIAGNOSIS — I951 Orthostatic hypotension: Secondary | ICD-10-CM | POA: Diagnosis not present

## 2021-05-16 DIAGNOSIS — E1165 Type 2 diabetes mellitus with hyperglycemia: Secondary | ICD-10-CM | POA: Diagnosis not present

## 2021-05-17 ENCOUNTER — Telehealth: Payer: Self-pay | Admitting: Cardiology

## 2021-05-17 DIAGNOSIS — E11621 Type 2 diabetes mellitus with foot ulcer: Secondary | ICD-10-CM | POA: Diagnosis not present

## 2021-05-17 DIAGNOSIS — L97512 Non-pressure chronic ulcer of other part of right foot with fat layer exposed: Secondary | ICD-10-CM | POA: Diagnosis not present

## 2021-05-17 DIAGNOSIS — Z792 Long term (current) use of antibiotics: Secondary | ICD-10-CM | POA: Diagnosis not present

## 2021-05-17 NOTE — Telephone Encounter (Signed)
Called patient and he reported that he has been having low blood pressures.He states that he stays below 90 and the bottom below 50 mostly. Today's blood pressure was 92/43. He also stated that 6 weeks ago he got out of bed and passed out and broke his toe. Patient also has been in the [facility] twice over the past two weeks for low blood pressure issues. He was just discharged yesterday after one hospital admission. He  would like to know if he can be worked in to the office instead. I scheduled an office visit for next Monday but also told him that I would forward this to Dr. Bing Matter for advice. Patient had no further questions at this time. ?

## 2021-05-17 NOTE — Telephone Encounter (Signed)
Pt c/o BP issue: STAT if pt c/o blurred vision, one-sided weakness or slurred speech ? ?1. What are your last 5 BP readings? 80/38, states the top stays below 90, and bottom below 50 mostly  ? ?2. Are you having any other symptoms (ex. Dizziness, headache, blurred vision, passed out)? Passed out 6 weeks ago and broke toe, gets dizzy and lightheaded,   ? ?3. What is your BP issue? Patient's wife states the patient's BP has stayed very low. She says they are frequent flyers at the ED and all they do there is give him fluids and send him home. She would like to know if he can be worked in to the office instead.  ?

## 2021-05-21 ENCOUNTER — Ambulatory Visit: Payer: Medicare Other | Admitting: Cardiology

## 2021-05-21 ENCOUNTER — Encounter: Payer: Self-pay | Admitting: Cardiology

## 2021-05-21 VITALS — BP 94/58 | HR 66 | Ht 65.0 in | Wt 152.0 lb

## 2021-05-21 DIAGNOSIS — E118 Type 2 diabetes mellitus with unspecified complications: Secondary | ICD-10-CM

## 2021-05-21 DIAGNOSIS — Z794 Long term (current) use of insulin: Secondary | ICD-10-CM | POA: Diagnosis not present

## 2021-05-21 DIAGNOSIS — Z951 Presence of aortocoronary bypass graft: Secondary | ICD-10-CM

## 2021-05-21 DIAGNOSIS — I10 Essential (primary) hypertension: Secondary | ICD-10-CM

## 2021-05-21 DIAGNOSIS — I951 Orthostatic hypotension: Secondary | ICD-10-CM

## 2021-05-21 HISTORY — DX: Orthostatic hypotension: I95.1

## 2021-05-21 NOTE — Progress Notes (Signed)
?Cardiology Office Note:   ? ?Date:  05/21/2021  ? ?ID:  Major Santerre, DOB [birth year], MRN [id number] ? ?PCP:  Noni Saupe, MD  ?Cardiologist:  Gypsy Balsam, MD   ? ?Referring MD: Noni Saupe, MD  ? ?Chief Complaint  ?Patient presents with  ? hospital follow up Low BP  ? ? ?History of Present Illness:   ? ?Jonathan Holder is a 66 y.o. male with past medical history significant for coronary artery disease in 2021 he end up coming to our hospital with episode of chest pain that he was transferred to [facility], cardiac catheterization has been performed he was find to have 90% mid LAD, 90% intermediate branch, 80% stenosis circumflex as well as 70% stenosis of right coronary artery.  At that time he was find to have ischemic cardiomyopathy with ejection fraction 25 to 30%.  He end up having coronary artery bypass graft with 5 grafts since that time his ejection fraction improved to latest estimation done just few weeks ago at the hospital showing normal left ventricle ejection fraction.  He also got diabetes depression dyslipidemia.  He was recently in the hospital twice because of orthostatic hypotension.  He ended up having fracture of his big toe on the right side because of orthostatic hypotension.  He was treated with fluids as well as midodrine.  Comes today to my office for follow-up still described to have episode of hypotension which make him dizzy weak and tired. ? ?Past Medical History:  ?Diagnosis Date  ? Anxiety   ? Arthritis   ? Depression   ? Diabetes mellitus without complication (HCC)   ? Essential hypertension 08/20/2019  ? History of kidney stones   ? HNP (herniated nucleus pulposus) with myelopathy, cervical 07/31/2016  ? Hyperlipidemia associated with type 2 diabetes mellitus (HCC) 08/20/2019  ? Hypertension   ? 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 from [facility] 08/20/2019  ? Multi-vessel coronary artery stenosis 08/20/2019  ? 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  Subsequent ST elevation (STEMI) myocardial infarction of anterior wall within 4 weeks of initial infarction (HCC) 08/20/2019  ? Type 2 diabetes mellitus with complication, with long-term current use of insulin (HCC) 08/20/2019  ? ? ?Past Surgical History:  ?Procedure Laterality Date  ? ANTERIOR CERVICAL DECOMP/DISCECTOMY FUSION N/A 07/31/2016  ? Procedure: ANTERIOR CERVICAL DECOMPRESSION/DISCECTOMY FUSION CERVICAL FOUR- CERVICAL FIVE, CERVICAL FIVE- CERVICAL SIX, CERVICAL SIX, CERVICAL SEVEN;  Surgeon: Shirlean Kelly, MD;  Location: [facility] OR;  Service: Neurosurgery;  Laterality: N/A;  ? COLONOSCOPY  04/13/2019  ? Dr. Jennye Boroughs, [facility]. Normal Colonoscopy to the terminal ileum but with a poor prep.  ? CORONARY ARTERY BYPASS GRAFT N/A 08/24/2019  ? Procedure: CORONARY ARTERY BYPASS GRAFTING (CABG), ON PUMP, TIMES FIVE, USING BILATERAL INTERNAL MAMMARY ARTERIES, ENDOSCOPICALLY HARVESTED RIGHT GREATER SAPHENOUS VEIN, AND OPEN LEFT RADIAL ARTERY HARVESTING;  Surgeon: Linden Dolin, MD;  Location: [facility] OR;  Service: Open Heart Surgery;  Laterality: N/A;  ? DG THUMB RIGHT HAND ([facility] HX) Left   ? plate  ? ESOPHAGOGASTRODUODENOSCOPY  04/13/2019  ? Dr. Jennye Boroughs, [facility]. Mild Scattered gastritis. Small amount of food in the fundus  ? EYE SURGERY Bilateral   ? cataract removed  ? LEFT HEART CATH AND CORONARY ANGIOGRAPHY N/A 08/20/2019  ? Procedure: LEFT HEART CATH AND CORONARY ANGIOGRAPHY;  Surgeon: Marykay Lex, MD;  Location: [facility] INVASIVE CV LAB;  Service: Cardiovascular;  Laterality: N/A;  ? PENILE PROSTHESIS IMPLANT    ?  RADIAL ARTERY HARVEST Left 08/24/2019  ? Procedure: RADIAL ARTERY HARVEST;  Surgeon: Linden Dolin, MD;  Location: [facility] OR;  Service: Open Heart Surgery;  Laterality: Left;  ? TEE WITHOUT CARDIOVERSION N/A 08/24/2019  ? Procedure: TRANSESOPHAGEAL ECHOCARDIOGRAM (TEE);  Surgeon: Linden Dolin, MD;  Location: [facility] OR;  Service: Open Heart Surgery;  Laterality: N/A;  ? ? ?Current  Medications: ?Current Meds  ?Medication Sig  ? aspirin EC 81 MG EC tablet Take 1 tablet (81 mg total) by mouth daily. Swallow whole.  ? atorvastatin (LIPITOR) 40 MG tablet Take 40 mg by mouth daily.  ? clopidogrel (PLAVIX) 75 MG tablet Take 1 tablet (75 mg total) by mouth daily.  ? desvenlafaxine (PRISTIQ) 50 MG 24 hr tablet Take 100 mg by mouth daily.  ? famotidine (PEPCID) 40 MG tablet Take 40 mg by mouth daily.  ? gabapentin (NEURONTIN) 300 MG capsule Take 900 mg by mouth every evening.   ? metoprolol succinate (TOPROL-XL) 25 MG 24 hr tablet TAKE 1 TABLET(25 MG) BY MOUTH DAILY WITH OR IMMEDIATELY FOLLOWING A MEAL (Patient taking differently: Take 25 mg by mouth daily.)  ? midodrine (PROAMATINE) 5 MG tablet TAKE 1/2 TABLET BY MOUTH THREE TIMES DAILY (Patient taking differently: Take 5 mg by mouth 3 (three) times daily with meals. TAKE 1/2 TABLET BY MOUTH THREE TIMES DAILY)  ? Multiple Vitamins-Minerals (PRESERVISION AREDS 2 PO) Take 1 tablet by mouth 2 (two) times daily.  ? NOVOLOG FLEXPEN 100 UNIT/ML FlexPen Inject 3 mLs into the skin See admin instructions. 1 unit per every 15 carbohydrates  ? QUEtiapine (SEROQUEL) 100 MG tablet Take 200 mg by mouth at bedtime.  ? TRESIBA FLEXTOUCH 100 UNIT/ML FlexTouch Pen Inject 13 Units into the skin daily.  ? [DISCONTINUED] sulfamethoxazole-trimethoprim (BACTRIM DS) 800-160 MG tablet Take 1 tablet by mouth 2 (two) times daily.  ?  ? ?Allergies:   Patient has no known allergies.  ? ?Social History  ? ?Socioeconomic History  ? Marital status: Married  ?  Spouse name: Not on file  ? Number of children: Not on file  ? Years of education: Not on file  ? Highest education level: Not on file  ?Occupational History  ? Occupation: Production designer, theatre/television/film  ?  Comment: [employer]  ?Tobacco Use  ? Smoking status: Never  ? Smokeless tobacco: Never  ?Vaping Use  ? Vaping Use: Never used  ?Substance and Sexual Activity  ? Alcohol use: No  ? Drug use: No  ? Sexual activity: Yes  ?Other Topics  Concern  ? Not on file  ?Social History Narrative  ? Works for [employer] business  ? ?Social Determinants of Health  ? ?Financial Resource Strain: Not on file  ?Food Insecurity: Not on file  ?Transportation Needs: Not on file  ?Physical Activity: Not on file  ?Stress: Not on file  ?Social Connections: Not on file  ?  ? ?Family History: ?The patient's family history includes Dementia in his mother; Heart disease in his father. ?ROS:   ?Please see the history of present illness.    ?All 14 point review of systems negative except as described per history of present illness ? ?EKGs/Labs/Other Studies Reviewed:   ? ? ? ?Recent Labs: ?No results found for requested labs within last 8760 hours.  ?Recent Lipid Panel ?   ?Component Value Date/Time  ? CHOL 143 04/11/2020 0834  ? TRIG 72 04/11/2020 0834  ? HDL 59 04/11/2020 0834  ? CHOLHDL 2.4 04/11/2020 0834  ? CHOLHDL  2.9 08/21/2019 0757  ? VLDL 8 08/21/2019 0757  ? LDLCALC 70 04/11/2020 0834  ? ? ?Physical Exam:   ? ?VS:  BP (!) 94/58 (BP Location: Right Arm, Patient Position: Sitting)   Pulse 66   Ht 5\' 5"  (1.651 m)   Wt 152 lb (68.9 kg)   SpO2 96%   BMI 25.29 kg/m?    ? ?Wt Readings from Last 3 Encounters:  ?05/21/21 152 lb (68.9 kg)  ?04/02/21 148 lb 12.8 oz (67.5 kg)  ?09/19/20 145 lb (65.8 kg)  ?  ? ?GEN:  Well nourished, well developed in no acute distress ?HEENT: Normal ?NECK: No JVD; No carotid bruits ?LYMPHATICS: No lymphadenopathy ?CARDIAC: RRR, no murmurs, no rubs, no gallops ?RESPIRATORY:  Clear to auscultation without rales, wheezing or rhonchi  ?ABDOMEN: Soft, non-tender, non-distended ?MUSCULOSKELETAL:  No edema; No deformity  ?SKIN: Warm and dry ?LOWER EXTREMITIES: no swelling ?NEUROLOGIC:  Alert and oriented x 3 ?PSYCHIATRIC:  Normal affect  ? ?ASSESSMENT:   ? ?1. 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 Type 2 diabetes mellitus with complication, with long-term current use of insulin (HCC)   ?3. Primary hypertension   ?4. Orthostatic hypotension    ? ?PLAN:   ? ?In order of problems listed above: ? ?Coronary disease status post coronary artery bypass graft he is on antiplatelet therapy dual antiplatelets therapy because of advanced disease we will continue. ?August 2021

## 2021-05-21 NOTE — Patient Instructions (Signed)
Medication Instructions:  ?Your physician has recommended you make the following change in your medication:  ? ?STOP METOPROLOL  ? ?*If you need a refill on your cardiac medications before your next appointment, please call your pharmacy* ? ? ?Lab Work: ?Your physician recommends that you have a BMP today  ? ?If you have labs (blood work) drawn today and your tests are completely normal, you will receive your results only by: ?MyChart Message (if you have MyChart) OR ?A paper copy in the mail ?If you have any lab test that is abnormal or we need to change your treatment, we will call you to review the results. ? ? ?Testing/Procedures: ?None Ordered ? ? ?Follow-Up: ?At [facility], you and your health needs are our priority.  As part of our continuing mission to provide you with exceptional heart care, we have created designated Provider Care Teams.  These Care Teams include your primary Cardiologist (physician) and Advanced Practice Providers (APPs -  Physician Assistants and Nurse Practitioners) who all work together to provide you with the care you need, when you need it. ? ?We recommend signing up for the patient portal called "MyChart".  Sign up information is provided on this After Visit Summary.  MyChart is used to connect with patients for Virtual Visits (Telemedicine).  Patients are able to view lab/test results, encounter notes, upcoming appointments, etc.  Non-urgent messages can be sent to your provider as well.   ?To learn more about what you can do with MyChart, go to [email or web address].   ? ?Your next appointment:   ?6 week(s) ? ?The format for your next appointment:   ?In Person ? ?Provider:   ?Jenne Campus, MD  ? ? ?Other Instructions ?None ? ?Important Information About Sugar ? ? ? ? ? ? ?

## 2021-05-22 LAB — BASIC METABOLIC PANEL
BUN/Creatinine Ratio: 15 (ref 10–24)
BUN: 26 mg/dL (ref 8–27)
CO2: 27 mmol/L (ref 20–29)
Calcium: 9.7 mg/dL (ref 8.6–10.2)
Chloride: 103 mmol/L (ref 96–106)
Creatinine, Ser: 1.72 mg/dL — ABNORMAL HIGH (ref 0.76–1.27)
Glucose: 211 mg/dL — ABNORMAL HIGH (ref 70–99)
Potassium: 5.3 mmol/L — ABNORMAL HIGH (ref 3.5–5.2)
Sodium: 144 mmol/L (ref 134–144)
eGFR: 44 mL/min/{1.73_m2} — ABNORMAL LOW (ref >59)

## 2021-05-24 DIAGNOSIS — E11621 Type 2 diabetes mellitus with foot ulcer: Secondary | ICD-10-CM | POA: Diagnosis not present

## 2021-05-24 DIAGNOSIS — L97512 Non-pressure chronic ulcer of other part of right foot with fat layer exposed: Secondary | ICD-10-CM | POA: Diagnosis not present

## 2021-05-24 DIAGNOSIS — I739 Peripheral vascular disease, unspecified: Secondary | ICD-10-CM | POA: Diagnosis not present

## 2021-05-25 ENCOUNTER — Telehealth: Payer: Self-pay

## 2021-05-25 DIAGNOSIS — E875 Hyperkalemia: Secondary | ICD-10-CM

## 2021-05-25 DIAGNOSIS — I10 Essential (primary) hypertension: Secondary | ICD-10-CM

## 2021-05-25 NOTE — Telephone Encounter (Signed)
-----   Message from Georgeanna Lea, MD sent at 05/24/2021 12:31 PM EDT ----- ?Potassium much improved, was 5.7 now 5.3.  We will need to repeat Chem-7 in about 10 days ?

## 2021-05-25 NOTE — Telephone Encounter (Signed)
Results reviewed with pt as per Dr. Krasowski's note.  Pt verbalized understanding and had no additional questions. Routed to PCP  

## 2021-06-05 DIAGNOSIS — E11621 Type 2 diabetes mellitus with foot ulcer: Secondary | ICD-10-CM | POA: Diagnosis not present

## 2021-06-05 DIAGNOSIS — L97512 Non-pressure chronic ulcer of other part of right foot with fat layer exposed: Secondary | ICD-10-CM | POA: Diagnosis not present

## 2021-06-05 DIAGNOSIS — F332 Major depressive disorder, recurrent severe without psychotic features: Secondary | ICD-10-CM | POA: Diagnosis not present

## 2021-06-12 DIAGNOSIS — E1065 Type 1 diabetes mellitus with hyperglycemia: Secondary | ICD-10-CM | POA: Diagnosis not present

## 2021-06-19 DIAGNOSIS — E11621 Type 2 diabetes mellitus with foot ulcer: Secondary | ICD-10-CM | POA: Diagnosis not present

## 2021-06-19 DIAGNOSIS — L97512 Non-pressure chronic ulcer of other part of right foot with fat layer exposed: Secondary | ICD-10-CM | POA: Diagnosis not present

## 2021-07-03 ENCOUNTER — Ambulatory Visit: Payer: Medicare Other | Admitting: Cardiology

## 2021-07-13 DIAGNOSIS — E1065 Type 1 diabetes mellitus with hyperglycemia: Secondary | ICD-10-CM | POA: Diagnosis not present

## 2021-07-17 DIAGNOSIS — Z8639 Personal history of other endocrine, nutritional and metabolic disease: Secondary | ICD-10-CM | POA: Diagnosis not present

## 2021-07-17 DIAGNOSIS — Z794 Long term (current) use of insulin: Secondary | ICD-10-CM | POA: Diagnosis not present

## 2021-07-17 DIAGNOSIS — E1065 Type 1 diabetes mellitus with hyperglycemia: Secondary | ICD-10-CM | POA: Diagnosis not present

## 2021-07-17 DIAGNOSIS — I951 Orthostatic hypotension: Secondary | ICD-10-CM | POA: Diagnosis not present

## 2021-07-19 DIAGNOSIS — R0600 Dyspnea, unspecified: Secondary | ICD-10-CM | POA: Diagnosis not present

## 2021-07-19 DIAGNOSIS — I951 Orthostatic hypotension: Secondary | ICD-10-CM | POA: Diagnosis not present

## 2021-07-19 DIAGNOSIS — M1990 Unspecified osteoarthritis, unspecified site: Secondary | ICD-10-CM | POA: Diagnosis not present

## 2021-07-19 DIAGNOSIS — F419 Anxiety disorder, unspecified: Secondary | ICD-10-CM | POA: Diagnosis not present

## 2021-07-19 DIAGNOSIS — J90 Pleural effusion, not elsewhere classified: Secondary | ICD-10-CM | POA: Diagnosis not present

## 2021-07-19 DIAGNOSIS — F319 Bipolar disorder, unspecified: Secondary | ICD-10-CM | POA: Diagnosis not present

## 2021-07-19 DIAGNOSIS — J159 Unspecified bacterial pneumonia: Secondary | ICD-10-CM | POA: Diagnosis not present

## 2021-07-19 DIAGNOSIS — J9601 Acute respiratory failure with hypoxia: Secondary | ICD-10-CM | POA: Diagnosis not present

## 2021-07-19 DIAGNOSIS — R6520 Severe sepsis without septic shock: Secondary | ICD-10-CM | POA: Diagnosis not present

## 2021-07-19 DIAGNOSIS — E1122 Type 2 diabetes mellitus with diabetic chronic kidney disease: Secondary | ICD-10-CM | POA: Diagnosis not present

## 2021-07-19 DIAGNOSIS — R509 Fever, unspecified: Secondary | ICD-10-CM | POA: Diagnosis not present

## 2021-07-19 DIAGNOSIS — R410 Disorientation, unspecified: Secondary | ICD-10-CM | POA: Diagnosis not present

## 2021-07-19 DIAGNOSIS — I130 Hypertensive heart and chronic kidney disease with heart failure and stage 1 through stage 4 chronic kidney disease, or unspecified chronic kidney disease: Secondary | ICD-10-CM | POA: Diagnosis not present

## 2021-07-19 DIAGNOSIS — A419 Sepsis, unspecified organism: Secondary | ICD-10-CM | POA: Diagnosis not present

## 2021-07-19 DIAGNOSIS — N183 Chronic kidney disease, stage 3 unspecified: Secondary | ICD-10-CM | POA: Diagnosis not present

## 2021-07-19 DIAGNOSIS — I2510 Atherosclerotic heart disease of native coronary artery without angina pectoris: Secondary | ICD-10-CM | POA: Diagnosis not present

## 2021-07-19 DIAGNOSIS — I959 Hypotension, unspecified: Secondary | ICD-10-CM | POA: Diagnosis not present

## 2021-07-19 DIAGNOSIS — D631 Anemia in chronic kidney disease: Secondary | ICD-10-CM | POA: Diagnosis not present

## 2021-07-19 DIAGNOSIS — J181 Lobar pneumonia, unspecified organism: Secondary | ICD-10-CM | POA: Diagnosis not present

## 2021-07-19 DIAGNOSIS — R4182 Altered mental status, unspecified: Secondary | ICD-10-CM | POA: Diagnosis not present

## 2021-07-19 DIAGNOSIS — G9341 Metabolic encephalopathy: Secondary | ICD-10-CM | POA: Diagnosis not present

## 2021-07-19 DIAGNOSIS — E785 Hyperlipidemia, unspecified: Secondary | ICD-10-CM | POA: Diagnosis not present

## 2021-07-19 DIAGNOSIS — I5030 Unspecified diastolic (congestive) heart failure: Secondary | ICD-10-CM | POA: Diagnosis not present

## 2021-07-23 ENCOUNTER — Other Ambulatory Visit: Payer: Self-pay | Admitting: Cardiology

## 2021-07-30 DIAGNOSIS — R5381 Other malaise: Secondary | ICD-10-CM | POA: Diagnosis not present

## 2021-07-30 DIAGNOSIS — Z6825 Body mass index (BMI) 25.0-25.9, adult: Secondary | ICD-10-CM | POA: Diagnosis not present

## 2021-07-30 DIAGNOSIS — Z9181 History of falling: Secondary | ICD-10-CM | POA: Diagnosis not present

## 2021-07-30 DIAGNOSIS — J189 Pneumonia, unspecified organism: Secondary | ICD-10-CM | POA: Diagnosis not present

## 2021-08-12 DIAGNOSIS — E1065 Type 1 diabetes mellitus with hyperglycemia: Secondary | ICD-10-CM | POA: Diagnosis not present

## 2021-08-16 DIAGNOSIS — E1065 Type 1 diabetes mellitus with hyperglycemia: Secondary | ICD-10-CM | POA: Diagnosis not present

## 2021-08-24 DIAGNOSIS — Z794 Long term (current) use of insulin: Secondary | ICD-10-CM | POA: Diagnosis not present

## 2021-08-24 DIAGNOSIS — E1065 Type 1 diabetes mellitus with hyperglycemia: Secondary | ICD-10-CM | POA: Diagnosis not present

## 2021-08-27 DIAGNOSIS — I951 Orthostatic hypotension: Secondary | ICD-10-CM | POA: Diagnosis not present

## 2021-08-27 DIAGNOSIS — Z8701 Personal history of pneumonia (recurrent): Secondary | ICD-10-CM | POA: Diagnosis not present

## 2021-08-27 DIAGNOSIS — D638 Anemia in other chronic diseases classified elsewhere: Secondary | ICD-10-CM | POA: Diagnosis not present

## 2021-09-03 ENCOUNTER — Telehealth: Payer: Self-pay | Admitting: Cardiology

## 2021-09-03 NOTE — Telephone Encounter (Signed)
Pt c/o medication issue:  1. Name of Medication: midodrine (PROAMATINE) 5 MG tablet  2. How are you currently taking this medication (dosage and times per day)? TAKE 1/2 TABLET BY MOUTH THREE TIMES DAILYPatient taking differently: Take 5 mg by mouth 3 (three) times daily with meals. TAKE 1/2 TABLET BY MOUTH THREE TIMES DAILY  3. Are you having a reaction (difficulty breathing--STAT)? No   4. What is your medication issue? Patient said that his PCP did blood tests and advised him to stop taking the immediately due to kidney affect

## 2021-09-04 ENCOUNTER — Telehealth: Payer: Self-pay | Admitting: Cardiology

## 2021-09-04 NOTE — Telephone Encounter (Signed)
Pt c/o medication issue:  1. Name of Medication: midodrine (PROAMATINE) 5 MG tablet  2. How are you currently taking this medication (dosage and times per day)? TAKE 1/2 TABLET BY MOUTH THREE TIMES DAILYPatient taking differently: Take 5 mg by mouth 3 (three) times daily with meals. TAKE 1/2 TABLET BY MOUTH THREE TIMES DAILY  3. Are you having a reaction (difficulty breathing--STAT)? no  4. What is your medication issue? Patient's wife called stating his PCP said this medication is causing kidney issues.  His PCP took him off the medication.

## 2021-09-10 ENCOUNTER — Telehealth: Payer: Self-pay | Admitting: Cardiology

## 2021-09-10 NOTE — Telephone Encounter (Signed)
Pt c/o BP issue: STAT if pt c/o blurred vision, one-sided weakness or slurred speech  1. What are your last 5 BP readings? 90/48  2. Are you having any other symptoms (ex. Dizziness, headache, blurred vision, passed out)? Pre-syncope; dizziness; lightheaded  3. What is your BP issue? Experiencing low BP

## 2021-09-11 ENCOUNTER — Telehealth: Payer: Self-pay

## 2021-09-11 DIAGNOSIS — I10 Essential (primary) hypertension: Secondary | ICD-10-CM

## 2021-09-11 DIAGNOSIS — I951 Orthostatic hypotension: Secondary | ICD-10-CM

## 2021-09-11 NOTE — Telephone Encounter (Signed)
Dr. Bing Matter spoke with Dr. Jeanie Sewer regarding Midodrine. Per Dr. Bing Matter recommended that pt come for a BMP. Pt stated that he would come in the morning for the bloodwork.

## 2021-09-12 DIAGNOSIS — E1065 Type 1 diabetes mellitus with hyperglycemia: Secondary | ICD-10-CM | POA: Diagnosis not present

## 2021-09-12 DIAGNOSIS — I951 Orthostatic hypotension: Secondary | ICD-10-CM | POA: Diagnosis not present

## 2021-09-12 DIAGNOSIS — I10 Essential (primary) hypertension: Secondary | ICD-10-CM | POA: Diagnosis not present

## 2021-09-13 LAB — BASIC METABOLIC PANEL
BUN/Creatinine Ratio: 15 (ref 10–24)
BUN: 25 mg/dL (ref 8–27)
CO2: 26 mmol/L (ref 20–29)
Calcium: 10.2 mg/dL (ref 8.6–10.2)
Chloride: 104 mmol/L (ref 96–106)
Creatinine, Ser: 1.63 mg/dL — ABNORMAL HIGH (ref 0.76–1.27)
Glucose: 153 mg/dL — ABNORMAL HIGH (ref 70–99)
Potassium: 5.1 mmol/L (ref 3.5–5.2)
Sodium: 144 mmol/L (ref 134–144)
eGFR: 46 mL/min/{1.73_m2} — ABNORMAL LOW (ref >59)

## 2021-09-14 ENCOUNTER — Telehealth: Payer: Self-pay

## 2021-09-14 DIAGNOSIS — I951 Orthostatic hypotension: Secondary | ICD-10-CM

## 2021-09-14 DIAGNOSIS — I10 Essential (primary) hypertension: Secondary | ICD-10-CM

## 2021-09-14 NOTE — Telephone Encounter (Signed)
Spoke with pt about lab results and Dr. Vanetta Shawl recommendations. Pt agreed and verbalized understanding. Lab req sent to lab.

## 2021-10-01 DIAGNOSIS — I951 Orthostatic hypotension: Secondary | ICD-10-CM | POA: Diagnosis not present

## 2021-10-01 DIAGNOSIS — I10 Essential (primary) hypertension: Secondary | ICD-10-CM | POA: Diagnosis not present

## 2021-10-02 LAB — BASIC METABOLIC PANEL
BUN/Creatinine Ratio: 14 (ref 10–24)
BUN: 24 mg/dL (ref 8–27)
CO2: 27 mmol/L (ref 20–29)
Calcium: 10.2 mg/dL (ref 8.6–10.2)
Chloride: 104 mmol/L (ref 96–106)
Creatinine, Ser: 1.67 mg/dL — ABNORMAL HIGH (ref 0.76–1.27)
Glucose: 213 mg/dL — ABNORMAL HIGH (ref 70–99)
Potassium: 5.4 mmol/L — ABNORMAL HIGH (ref 3.5–5.2)
Sodium: 144 mmol/L (ref 134–144)
eGFR: 45 mL/min/{1.73_m2} — ABNORMAL LOW (ref >59)

## 2021-10-04 ENCOUNTER — Telehealth: Payer: Self-pay

## 2021-10-04 NOTE — Telephone Encounter (Signed)
-----   Message from Georgeanna Lea, MD sent at 10/04/2021 11:31 AM EDT ----- Chem-7 looks good, continue present management

## 2021-10-04 NOTE — Telephone Encounter (Signed)
Patient notified of results.

## 2021-10-13 DIAGNOSIS — E1065 Type 1 diabetes mellitus with hyperglycemia: Secondary | ICD-10-CM | POA: Diagnosis not present

## 2021-10-17 DIAGNOSIS — I25810 Atherosclerosis of coronary artery bypass graft(s) without angina pectoris: Secondary | ICD-10-CM | POA: Diagnosis not present

## 2021-10-17 DIAGNOSIS — E785 Hyperlipidemia, unspecified: Secondary | ICD-10-CM | POA: Diagnosis not present

## 2021-10-17 DIAGNOSIS — E1065 Type 1 diabetes mellitus with hyperglycemia: Secondary | ICD-10-CM | POA: Diagnosis not present

## 2021-10-17 DIAGNOSIS — Z8639 Personal history of other endocrine, nutritional and metabolic disease: Secondary | ICD-10-CM | POA: Diagnosis not present

## 2021-10-19 DIAGNOSIS — H40001 Preglaucoma, unspecified, right eye: Secondary | ICD-10-CM | POA: Diagnosis not present

## 2021-10-19 DIAGNOSIS — E113393 Type 2 diabetes mellitus with moderate nonproliferative diabetic retinopathy without macular edema, bilateral: Secondary | ICD-10-CM | POA: Diagnosis not present

## 2021-11-01 DIAGNOSIS — H35373 Puckering of macula, bilateral: Secondary | ICD-10-CM | POA: Diagnosis not present

## 2021-11-01 DIAGNOSIS — H04123 Dry eye syndrome of bilateral lacrimal glands: Secondary | ICD-10-CM | POA: Diagnosis not present

## 2021-11-01 DIAGNOSIS — E103513 Type 1 diabetes mellitus with proliferative diabetic retinopathy with macular edema, bilateral: Secondary | ICD-10-CM | POA: Diagnosis not present

## 2021-11-01 DIAGNOSIS — H43813 Vitreous degeneration, bilateral: Secondary | ICD-10-CM | POA: Diagnosis not present

## 2021-11-09 DIAGNOSIS — Z794 Long term (current) use of insulin: Secondary | ICD-10-CM | POA: Diagnosis not present

## 2021-11-09 DIAGNOSIS — E1065 Type 1 diabetes mellitus with hyperglycemia: Secondary | ICD-10-CM | POA: Diagnosis not present

## 2021-11-09 DIAGNOSIS — Z8639 Personal history of other endocrine, nutritional and metabolic disease: Secondary | ICD-10-CM | POA: Diagnosis not present

## 2021-11-26 ENCOUNTER — Ambulatory Visit: Payer: Medicare Other | Attending: Cardiology | Admitting: Cardiology

## 2021-11-26 ENCOUNTER — Encounter: Payer: Self-pay | Admitting: Cardiology

## 2021-12-19 DIAGNOSIS — R197 Diarrhea, unspecified: Secondary | ICD-10-CM | POA: Diagnosis not present

## 2021-12-19 DIAGNOSIS — K591 Functional diarrhea: Secondary | ICD-10-CM | POA: Diagnosis not present

## 2021-12-20 DIAGNOSIS — R197 Diarrhea, unspecified: Secondary | ICD-10-CM | POA: Diagnosis not present

## 2021-12-31 DIAGNOSIS — Z794 Long term (current) use of insulin: Secondary | ICD-10-CM | POA: Diagnosis not present

## 2021-12-31 DIAGNOSIS — Z8639 Personal history of other endocrine, nutritional and metabolic disease: Secondary | ICD-10-CM | POA: Diagnosis not present

## 2021-12-31 DIAGNOSIS — E1065 Type 1 diabetes mellitus with hyperglycemia: Secondary | ICD-10-CM | POA: Diagnosis not present

## 2022-01-02 DIAGNOSIS — F332 Major depressive disorder, recurrent severe without psychotic features: Secondary | ICD-10-CM | POA: Diagnosis not present

## 2022-01-17 DIAGNOSIS — E1065 Type 1 diabetes mellitus with hyperglycemia: Secondary | ICD-10-CM | POA: Diagnosis not present

## 2022-01-17 DIAGNOSIS — I25810 Atherosclerosis of coronary artery bypass graft(s) without angina pectoris: Secondary | ICD-10-CM | POA: Diagnosis not present

## 2022-01-17 DIAGNOSIS — Z9641 Presence of insulin pump (external) (internal): Secondary | ICD-10-CM | POA: Diagnosis not present

## 2022-01-17 DIAGNOSIS — Z794 Long term (current) use of insulin: Secondary | ICD-10-CM | POA: Diagnosis not present

## 2022-01-22 DIAGNOSIS — E1159 Type 2 diabetes mellitus with other circulatory complications: Secondary | ICD-10-CM | POA: Diagnosis not present

## 2022-01-22 DIAGNOSIS — I152 Hypertension secondary to endocrine disorders: Secondary | ICD-10-CM | POA: Diagnosis not present

## 2022-01-22 DIAGNOSIS — Z6825 Body mass index (BMI) 25.0-25.9, adult: Secondary | ICD-10-CM | POA: Diagnosis not present

## 2022-01-22 DIAGNOSIS — E7800 Pure hypercholesterolemia, unspecified: Secondary | ICD-10-CM | POA: Diagnosis not present

## 2022-02-05 DIAGNOSIS — Z794 Long term (current) use of insulin: Secondary | ICD-10-CM | POA: Diagnosis not present

## 2022-02-05 DIAGNOSIS — E1065 Type 1 diabetes mellitus with hyperglycemia: Secondary | ICD-10-CM | POA: Diagnosis not present

## 2022-02-12 ENCOUNTER — Ambulatory Visit: Payer: Medicare Other | Attending: Cardiology | Admitting: Cardiology

## 2022-02-12 ENCOUNTER — Encounter: Payer: Self-pay | Admitting: Cardiology

## 2022-02-12 VITALS — BP 110/72 | HR 76 | Ht 65.0 in | Wt 161.4 lb

## 2022-02-12 DIAGNOSIS — I10 Essential (primary) hypertension: Secondary | ICD-10-CM

## 2022-02-12 DIAGNOSIS — E782 Mixed hyperlipidemia: Secondary | ICD-10-CM

## 2022-02-12 DIAGNOSIS — E1065 Type 1 diabetes mellitus with hyperglycemia: Secondary | ICD-10-CM | POA: Diagnosis not present

## 2022-02-12 DIAGNOSIS — I25810 Atherosclerosis of coronary artery bypass graft(s) without angina pectoris: Secondary | ICD-10-CM | POA: Diagnosis not present

## 2022-02-12 DIAGNOSIS — R0609 Other forms of dyspnea: Secondary | ICD-10-CM

## 2022-02-12 DIAGNOSIS — Z794 Long term (current) use of insulin: Secondary | ICD-10-CM | POA: Diagnosis not present

## 2022-02-12 DIAGNOSIS — Z951 Presence of aortocoronary bypass graft: Secondary | ICD-10-CM | POA: Diagnosis not present

## 2022-02-12 DIAGNOSIS — N1831 Chronic kidney disease, stage 3a: Secondary | ICD-10-CM | POA: Insufficient documentation

## 2022-02-12 DIAGNOSIS — I951 Orthostatic hypotension: Secondary | ICD-10-CM

## 2022-02-12 DIAGNOSIS — Z9641 Presence of insulin pump (external) (internal): Secondary | ICD-10-CM | POA: Diagnosis not present

## 2022-02-12 DIAGNOSIS — I255 Ischemic cardiomyopathy: Secondary | ICD-10-CM

## 2022-02-12 HISTORY — DX: Chronic kidney disease, stage 3a: N18.31

## 2022-02-12 NOTE — Patient Instructions (Signed)
Medication Instructions:  Your physician recommends that you continue on your current medications as directed. Please refer to the Current Medication list given to you today.  *If you need a refill on your cardiac medications before your next appointment, please call your pharmacy*   Lab Work: Your physician recommends that you return for lab work in: when fasting You need to have labs done when you are fasting.  You can come Monday through Friday 8:30 am to 12:00 pm and 1:15 to 4:30. You do not need to make an appointment as the order has already been placed. The labs you are going to have done are Lipids.    Testing/Procedures: Your physician has requested that you have an echocardiogram. Echocardiography is a painless test that uses sound waves to create images of your heart. It provides your doctor with information about the size and shape of your heart and how well your heart's chambers and valves are working. This procedure takes approximately one hour. There are no restrictions for this procedure. Please do NOT wear cologne, perfume, aftershave, or lotions (deodorant is allowed). Please arrive 15 minutes prior to your appointment time.    Follow-Up: At [facility], you and your health needs are our priority.  As part of our continuing mission to provide you with exceptional heart care, we have created designated Provider Care Teams.  These Care Teams include your primary Cardiologist (physician) and Advanced Practice Providers (APPs -  Physician Assistants and Nurse Practitioners) who all work together to provide you with the care you need, when you need it.  We recommend signing up for the patient portal called "MyChart".  Sign up information is provided on this After Visit Summary.  MyChart is used to connect with patients for Virtual Visits (Telemedicine).  Patients are able to view lab/test results, encounter notes, upcoming appointments, etc.  Non-urgent messages can be sent to  your provider as well.   To learn more about what you can do with MyChart, go to [email or web address].    Your next appointment:   6 month(s)  The format for your next appointment:   In Person  Provider:   Robert Krasowski, MD    Other Instructions NA  

## 2022-02-12 NOTE — Progress Notes (Unsigned)
Cardiology Office Note:    Date:  02/12/2022   ID:  Jonathan Holder, DOB 1955-04-12, MRN [id number]  PCP:  Angelina Sheriff, MD  Cardiologist:  Jenne Campus, MD    Referring MD: Angelina Sheriff, MD   Chief Complaint  Patient presents with   Follow-up  Doing fine but have problem with sugar  History of Present Illness:    Jonathan Holder is a 67 y.o. male with past medical history significant for coronary artery disease in 2021 he end up coming to the hospital episode of chest pain he was transferred to [facility], cardiac catheterization has been performed which was fine 90% mid LAD 90% intermediate branch, 80% stenosis of the circumflex as well as 70 external percent stenosis of the right coronary artery he was also found to have ischemic cardiomyopathy with ejection fraction 25 to 30%.  He end up having coronary bypass graft done 5 grafts after that ejection fraction improved.  Additional problem include diabetes which appears to be difficult to control, depression, dyslipidemia, orthostatic hypotension. He comes today 2 months for follow-up overall seems to be doing better.  Denies have any chest pain tightness squeezing pressure burning chest he looks quite optimistic today and cheerful.  He does struggle with seeing his diabetes control.  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/24/2019   Subsequent ST elevation (STEMI) myocardial  infarction of anterior wall within 4 weeks of initial infarction ([place]) 08/20/2019   Type 2 diabetes mellitus with complication, with long-term current use of insulin ([place]) 08/20/2019    Past Surgical History:  Procedure Laterality Date   ANTERIOR CERVICAL DECOMP/DISCECTOMY FUSION N/A 07/31/2016   Procedure: ANTERIOR CERVICAL DECOMPRESSION/DISCECTOMY FUSION CERVICAL FOUR- CERVICAL FIVE, CERVICAL FIVE- CERVICAL SIX, CERVICAL SIX, CERVICAL SEVEN;  Surgeon: Jovita Gamma, MD;  Location: [place];  Service: Neurosurgery;  Laterality: N/A;   COLONOSCOPY  04/13/2019   Dr. Lyda Jester, [place]. Normal Colonoscopy to the terminal ileum but with a poor prep.   CORONARY ARTERY BYPASS GRAFT N/A 08/24/2019   Procedure: CORONARY ARTERY BYPASS GRAFTING (CABG), ON PUMP, TIMES FIVE, USING BILATERAL INTERNAL MAMMARY ARTERIES, ENDOSCOPICALLY HARVESTED RIGHT GREATER SAPHENOUS VEIN, AND OPEN LEFT RADIAL ARTERY HARVESTING;  Surgeon: Wonda Olds, MD;  Location: [place];  Service: Open Heart Surgery;  Laterality: N/A;   DG THUMB RIGHT HAND ([facility] HX) Left    plate   ESOPHAGOGASTRODUODENOSCOPY  04/13/2019   Dr. Lyda Jester, [place]. Mild Scattered gastritis. Small amount of food in the fundus   EYE SURGERY Bilateral    cataract removed   LEFT HEART CATH AND CORONARY ANGIOGRAPHY N/A 08/20/2019   Procedure: LEFT HEART CATH AND CORONARY ANGIOGRAPHY;  Surgeon: Leonie Man, MD;  Location: [place] CV LAB;  Service: Cardiovascular;  Laterality: N/A;   PENILE PROSTHESIS IMPLANT     RADIAL ARTERY HARVEST Left 08/24/2019   Procedure: RADIAL ARTERY HARVEST;  Surgeon: Orvan Seen,  Merri Brunette, MD;  Location: [facility] OR;  Service: Open Heart Surgery;  Laterality: Left;   TEE WITHOUT CARDIOVERSION N/A 08/24/2019   Procedure: TRANSESOPHAGEAL ECHOCARDIOGRAM (TEE);  Surgeon: Linden Dolin, MD;  Location: [facility] OR;  Service: Open Heart Surgery;  Laterality: N/A;    Current Medications: Current Meds  Medication Sig    aspirin EC 81 MG EC tablet Take 1 tablet (81 mg total) by mouth daily. Swallow whole.   atorvastatin (LIPITOR) 40 MG tablet Take 40 mg by mouth daily.   clopidogrel (PLAVIX) 75 MG tablet Take 1 tablet (75 mg total) by mouth daily.   desvenlafaxine (PRISTIQ) 50 MG 24 hr tablet Take 100 mg by mouth daily.   famotidine (PEPCID) 40 MG tablet Take 40 mg by mouth daily.   gabapentin (NEURONTIN) 300 MG capsule Take 900 mg by mouth every evening.    midodrine (PROAMATINE) 5 MG tablet TAKE 1/2 TABLET BY MOUTH THREE TIMES DAILY (Patient taking differently: Take 5 mg by mouth 3 (three) times daily with meals. TAKE 1/2 TABLET BY MOUTH THREE TIMES DAILY)   NOVOLOG FLEXPEN 100 UNIT/ML FlexPen Inject 3 mLs into the skin See admin instructions. 1 unit per every 15 carbohydrates   QUEtiapine (SEROQUEL) 100 MG tablet Take 200 mg by mouth at bedtime.   [DISCONTINUED] Multiple Vitamins-Minerals (PRESERVISION AREDS 2 PO) Take 1 tablet by mouth 2 (two) times daily.   [DISCONTINUED] TRESIBA FLEXTOUCH 100 UNIT/ML FlexTouch Pen Inject 13 Units into the skin daily.     Allergies:   Patient has no known allergies.   Social History   Socioeconomic History   Marital status: Married    Spouse name: Not on file   Number of children: Not on file   Years of education: Not on file   Highest education level: Not on file  Occupational History   Occupation: Production designer, theatre/television/film    Comment: [employer]  Tobacco Use   Smoking status: Never   Smokeless tobacco: Never  Vaping Use   Vaping Use: Never used  Substance and Sexual Activity   Alcohol use: No   Drug use: No   Sexual activity: Yes  Other Topics Concern   Not on file  Social History Narrative   Works for [employer] business   Social Determinants of Health   Financial Resource Strain: Not on file  Food Insecurity: Not on file  Transportation Needs: Not on file  Physical Activity: Not on file  Stress: Not on file  Social Connections: Not on file      Family History: The patient's family history includes Dementia in his mother; Heart disease in his father. ROS:   Please see the history of present illness.    All 14 point review of systems negative except as described per history of present illness  EKGs/Labs/Other Studies Reviewed:      Recent Labs: 10/01/2021: BUN 24; Creatinine, Ser 1.67; Potassium 5.4; Sodium 144  Recent Lipid Panel    Component Value Date/Time   CHOL 143 04/11/2020 0834   TRIG 72 04/11/2020 0834   HDL 59 04/11/2020 0834   CHOLHDL 2.4 04/11/2020 0834   CHOLHDL 2.9 08/21/2019 0757   VLDL 8 08/21/2019 0757   LDLCALC 70 04/11/2020 0834    Physical Exam:    VS:  BP 110/72 (BP Location: Left Arm, Patient Position: Sitting)   Pulse 76   Ht 5\' 5"  (1.651 m)   Wt 161 lb 6.4 oz (73.2 kg)   SpO2 98%   BMI 26.86  kg/m     Wt Readings from Last 3 Encounters:  02/12/22 161 lb 6.4 oz (73.2 kg)  05/21/21 152 lb (68.9 kg)  04/02/21 148 lb 12.8 oz (67.5 kg)     GEN:  Well nourished, well developed in no acute distress HEENT: Normal NECK: No JVD; No carotid bruits LYMPHATICS: No lymphadenopathy CARDIAC: RRR, no murmurs, no rubs, no gallops RESPIRATORY:  Clear to auscultation without rales, wheezing or rhonchi  ABDOMEN: Soft, non-tender, non-distended MUSCULOSKELETAL:  No edema; No deformity  SKIN: Warm and dry LOWER EXTREMITIES: no swelling NEUROLOGIC:  Alert and oriented x 3 PSYCHIATRIC:  Normal affect   ASSESSMENT:    1. 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 Mixed hyperlipidemia   3. Essential hypertension   4. Ischemic cardiomyopathy   5. Orthostatic hypotension    PLAN:    In order of problems listed above:  Status post coronary bypass graft looks stable from that point review on antiplatelet therapy which I will continue. Dyslipidemia he is on Lipitor 40.  I will ask him to have fasting lipid profile done.  I did review K PN and last data I have is from February with total cholesterol 132 HDL  46. Essential hypertension we have open the problem right now with orthostatic hypotension. Orthostatic hypotension he takes midodrine on the regular basis which seems to be helping   Medication Adjustments/Labs and Tests Ordered: Current medicines are reviewed at length with the patient today.  Concerns regarding medicines are outlined above.  No orders of the defined types were placed in this encounter.  Medication changes: No orders of the defined types were placed in this encounter.   Signed, Georgeanna Lea, MD, [facility] 02/12/2022 4:19 PM    [facility]

## 2022-02-18 ENCOUNTER — Other Ambulatory Visit: Payer: Medicare Other

## 2022-02-21 ENCOUNTER — Ambulatory Visit: Payer: Medicare Other | Attending: Cardiology

## 2022-02-21 DIAGNOSIS — R0609 Other forms of dyspnea: Secondary | ICD-10-CM

## 2022-02-21 DIAGNOSIS — E782 Mixed hyperlipidemia: Secondary | ICD-10-CM | POA: Diagnosis not present

## 2022-02-21 LAB — ALT: ALT: 30 IU/L (ref 0–44)

## 2022-02-21 LAB — LIPID PANEL
Chol/HDL Ratio: 2.7 ratio (ref 0.0–5.0)
Cholesterol, Total: 124 mg/dL (ref 100–199)
HDL: 46 mg/dL (ref >39)
LDL Chol Calc (NIH): 60 mg/dL (ref 0–99)
Triglycerides: 95 mg/dL (ref 0–149)
VLDL Cholesterol Cal: 18 mg/dL (ref 5–40)

## 2022-02-21 LAB — AST: AST: 41 IU/L — ABNORMAL HIGH (ref 0–40)

## 2022-02-21 MED ORDER — PERFLUTREN LIPID MICROSPHERE
1.0000 mL | INTRAVENOUS | Status: AC | PRN
  Administered 2022-02-21: 5 mL via INTRAVENOUS

## 2022-02-22 LAB — ECHOCARDIOGRAM COMPLETE
Area-P 1/2: 5.42 cm2
Calc EF: 49.6 %
S' Lateral: 4 cm
Single Plane A2C EF: 51.1 %
Single Plane A4C EF: 45.1 %

## 2022-03-01 ENCOUNTER — Ambulatory Visit: Payer: Medicare Other | Admitting: Cardiology

## 2022-03-07 DIAGNOSIS — Z794 Long term (current) use of insulin: Secondary | ICD-10-CM | POA: Diagnosis not present

## 2022-03-07 DIAGNOSIS — E1065 Type 1 diabetes mellitus with hyperglycemia: Secondary | ICD-10-CM | POA: Diagnosis not present

## 2022-03-27 DIAGNOSIS — Z8639 Personal history of other endocrine, nutritional and metabolic disease: Secondary | ICD-10-CM | POA: Diagnosis not present

## 2022-03-27 DIAGNOSIS — E1065 Type 1 diabetes mellitus with hyperglycemia: Secondary | ICD-10-CM | POA: Diagnosis not present

## 2022-03-27 DIAGNOSIS — Z794 Long term (current) use of insulin: Secondary | ICD-10-CM | POA: Diagnosis not present

## 2022-04-06 DIAGNOSIS — Z794 Long term (current) use of insulin: Secondary | ICD-10-CM | POA: Diagnosis not present

## 2022-04-06 DIAGNOSIS — E1065 Type 1 diabetes mellitus with hyperglycemia: Secondary | ICD-10-CM | POA: Diagnosis not present

## 2022-04-22 DIAGNOSIS — N1831 Chronic kidney disease, stage 3a: Secondary | ICD-10-CM | POA: Diagnosis not present

## 2022-04-22 DIAGNOSIS — E785 Hyperlipidemia, unspecified: Secondary | ICD-10-CM | POA: Diagnosis not present

## 2022-04-22 DIAGNOSIS — Z9641 Presence of insulin pump (external) (internal): Secondary | ICD-10-CM | POA: Diagnosis not present

## 2022-04-22 DIAGNOSIS — E1065 Type 1 diabetes mellitus with hyperglycemia: Secondary | ICD-10-CM | POA: Diagnosis not present

## 2022-05-14 DIAGNOSIS — R0981 Nasal congestion: Secondary | ICD-10-CM | POA: Diagnosis not present

## 2022-05-14 DIAGNOSIS — R5383 Other fatigue: Secondary | ICD-10-CM | POA: Diagnosis not present

## 2022-05-14 DIAGNOSIS — R051 Acute cough: Secondary | ICD-10-CM | POA: Diagnosis not present

## 2022-05-14 DIAGNOSIS — J019 Acute sinusitis, unspecified: Secondary | ICD-10-CM | POA: Diagnosis not present

## 2022-06-08 IMAGING — DX DG CHEST 2V
2 series · 2 of 2 positions shown · non-contrast
Comparison: 08/20/2019

CLINICAL DATA: 64-year-old male with a history of congestive heart
failure

EXAM:
CHEST - 2 VIEW

[chest lat]
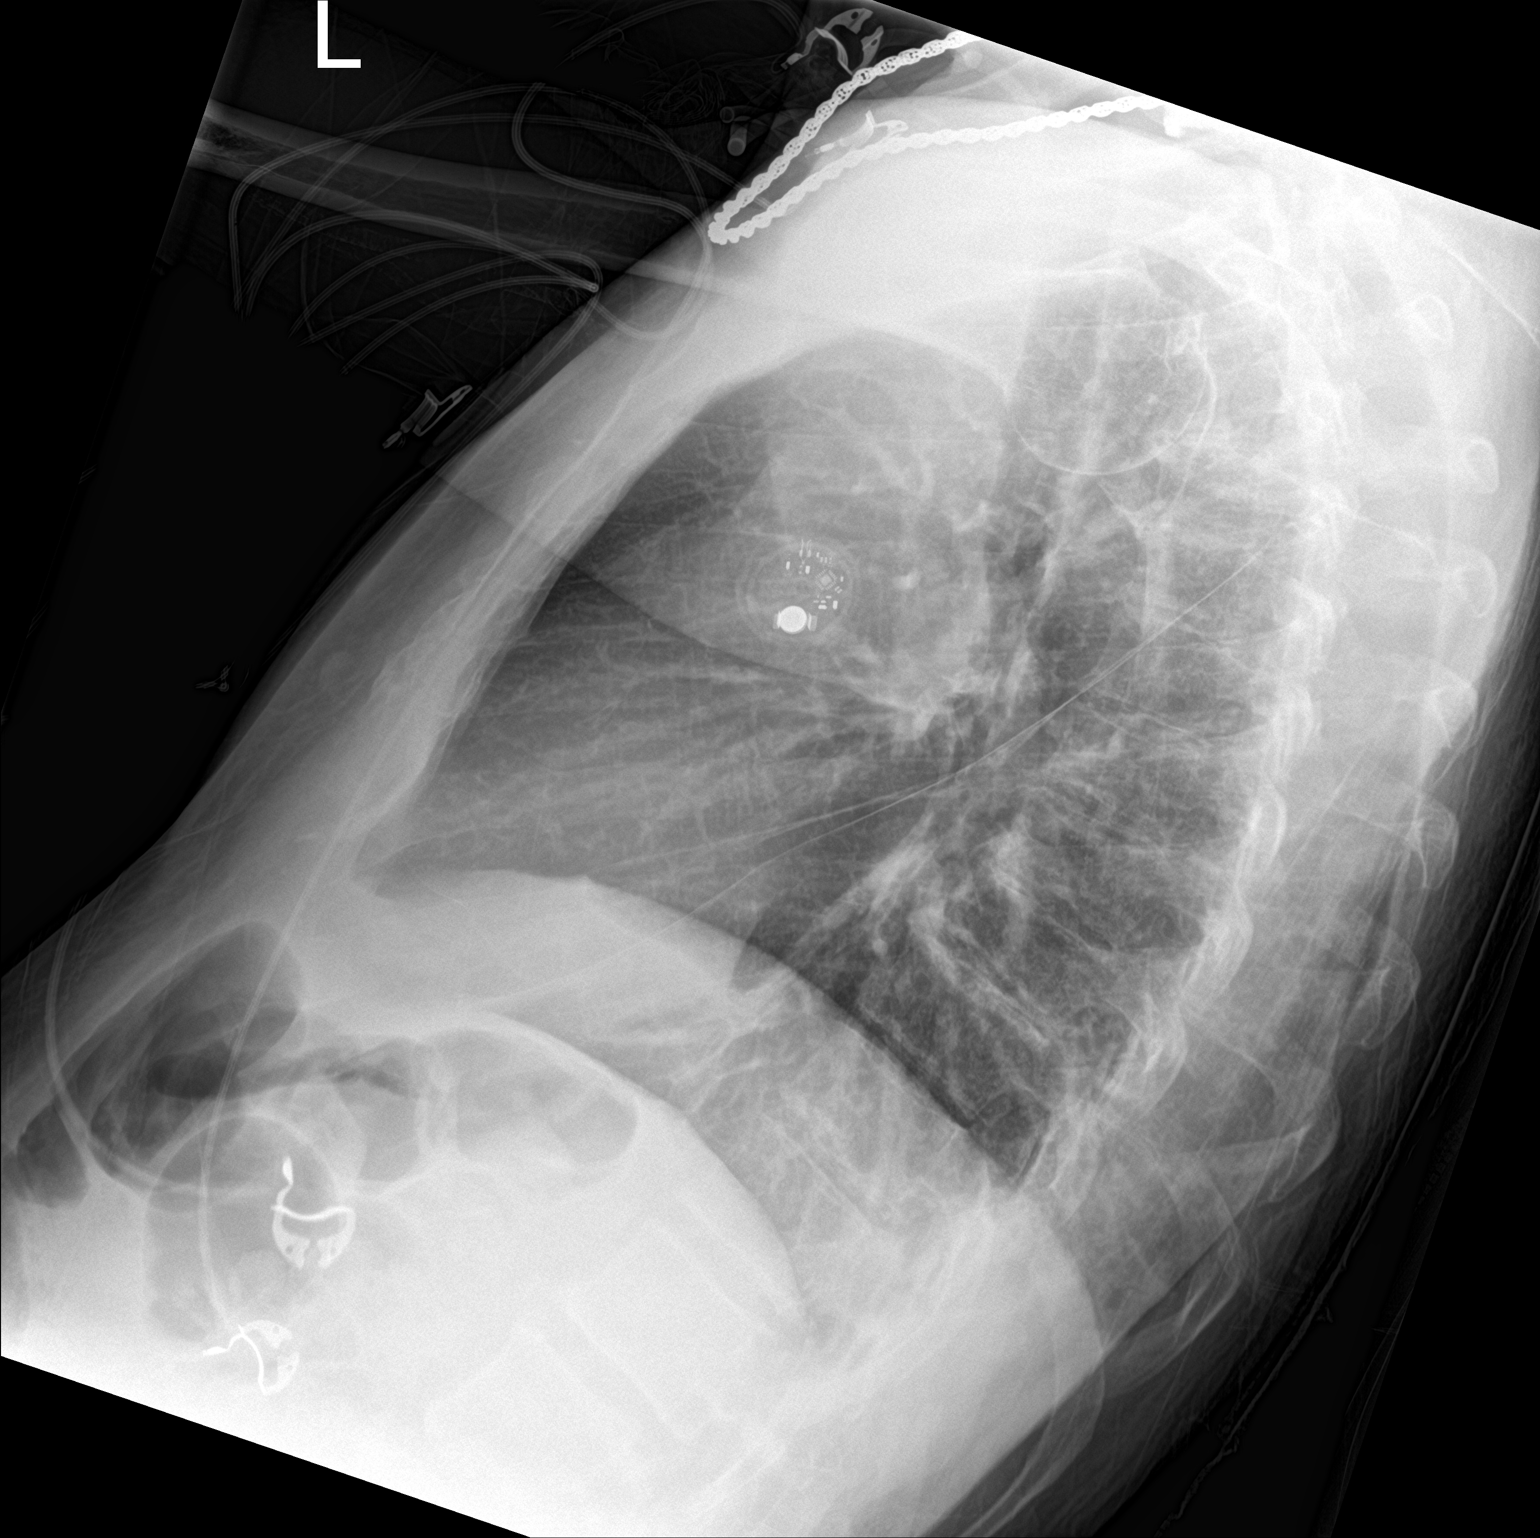

[chest ap]
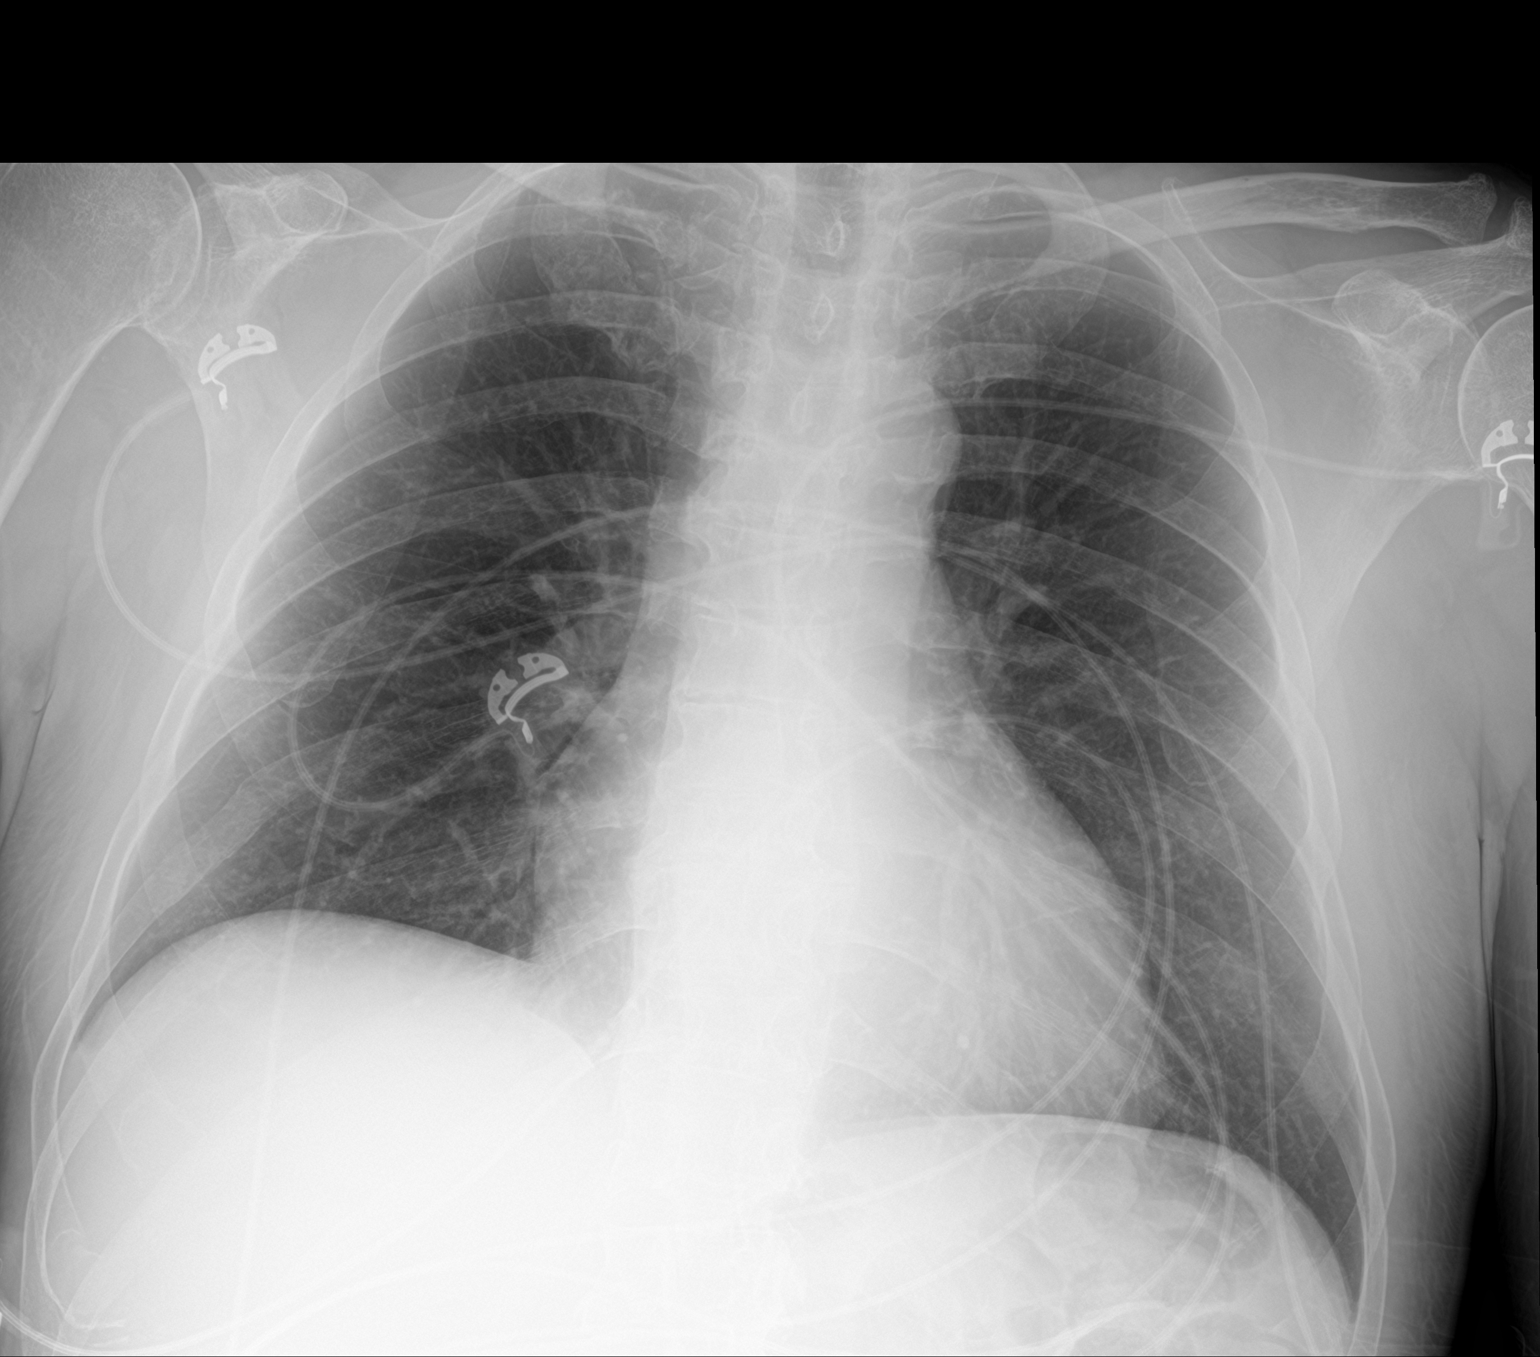

[2 of 2 positions shown; findings below may reference images not displayed]

FINDINGS: Cardiomediastinal silhouette unchanged in size and contour. No
pneumothorax. No pleural effusion. No confluent airspace disease.

No acute displaced fracture. Chronic rib deformity on the right.
Degenerative changes of the spine.
IMPRESSION: Negative for acute cardiopulmonary disease.

## 2022-06-10 DIAGNOSIS — M25562 Pain in left knee: Secondary | ICD-10-CM | POA: Diagnosis not present

## 2022-06-10 DIAGNOSIS — S80912A Unspecified superficial injury of left knee, initial encounter: Secondary | ICD-10-CM | POA: Diagnosis not present

## 2022-06-12 IMAGING — DX DG CHEST 1V PORT
1 series · 1 of 1 positions shown · non-contrast
Comparison: 08/24/2019

CLINICAL DATA: Status post cardiac surgery.

EXAM:
PORTABLE CHEST 1 VIEW

[chest]
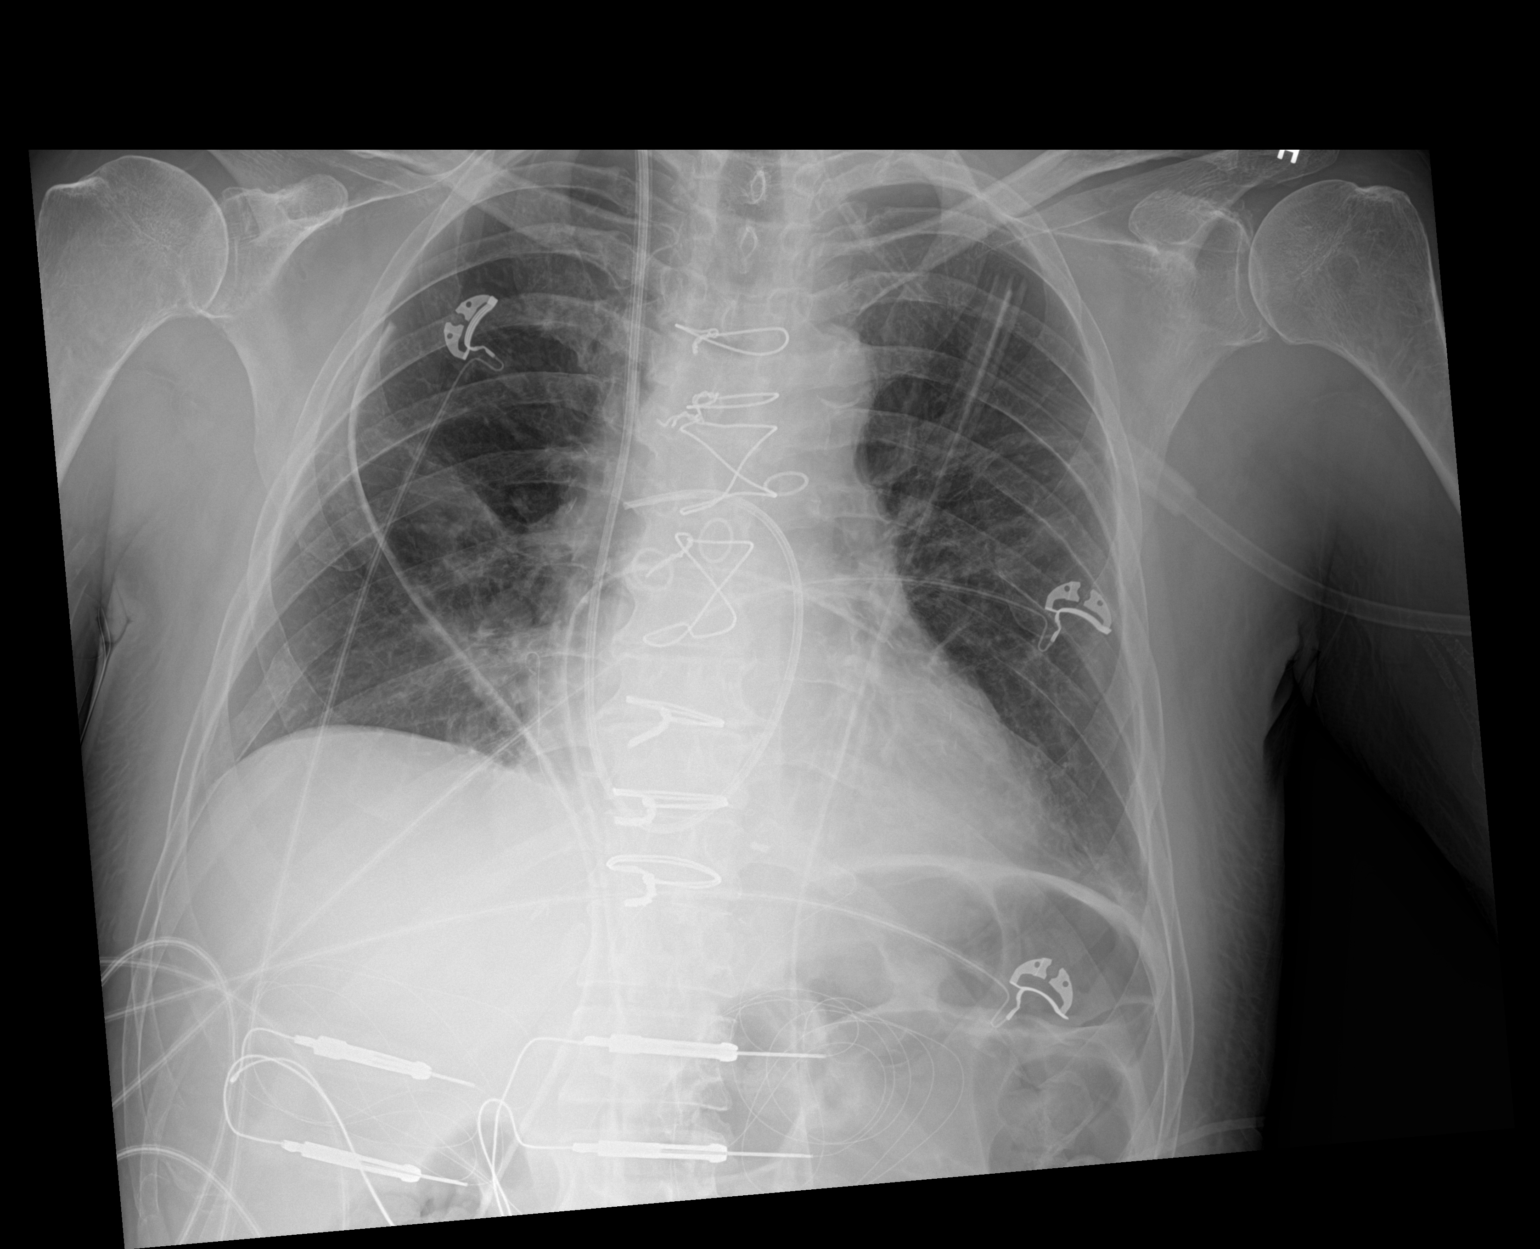

[1 of 1 positions shown; findings below may reference images not displayed]

FINDINGS: Sequelae of CABG are again identified. Endotracheal and enteric
tubes have been removed. A right jugular Swan-Ganz catheter remains
in place terminating over the right main pulmonary artery. A
mediastinal drain and bilateral chest tubes remain in place. The
cardiomediastinal silhouette is unchanged with normal heart size.
There is mild opacity in both lung bases. No sizable pleural
effusion or pneumothorax is identified.
IMPRESSION: Interval extubation.  Mild bibasilar atelectasis.  No pneumothorax.

## 2022-06-13 DIAGNOSIS — M1712 Unilateral primary osteoarthritis, left knee: Secondary | ICD-10-CM | POA: Diagnosis not present

## 2022-06-24 IMAGING — CR DG CHEST 2V
2 series · 2 of 2 positions shown · non-contrast
Comparison: 08/30/2019

CLINICAL DATA: Post CABG

EXAM:
CHEST - 2 VIEW

[w chest pa]
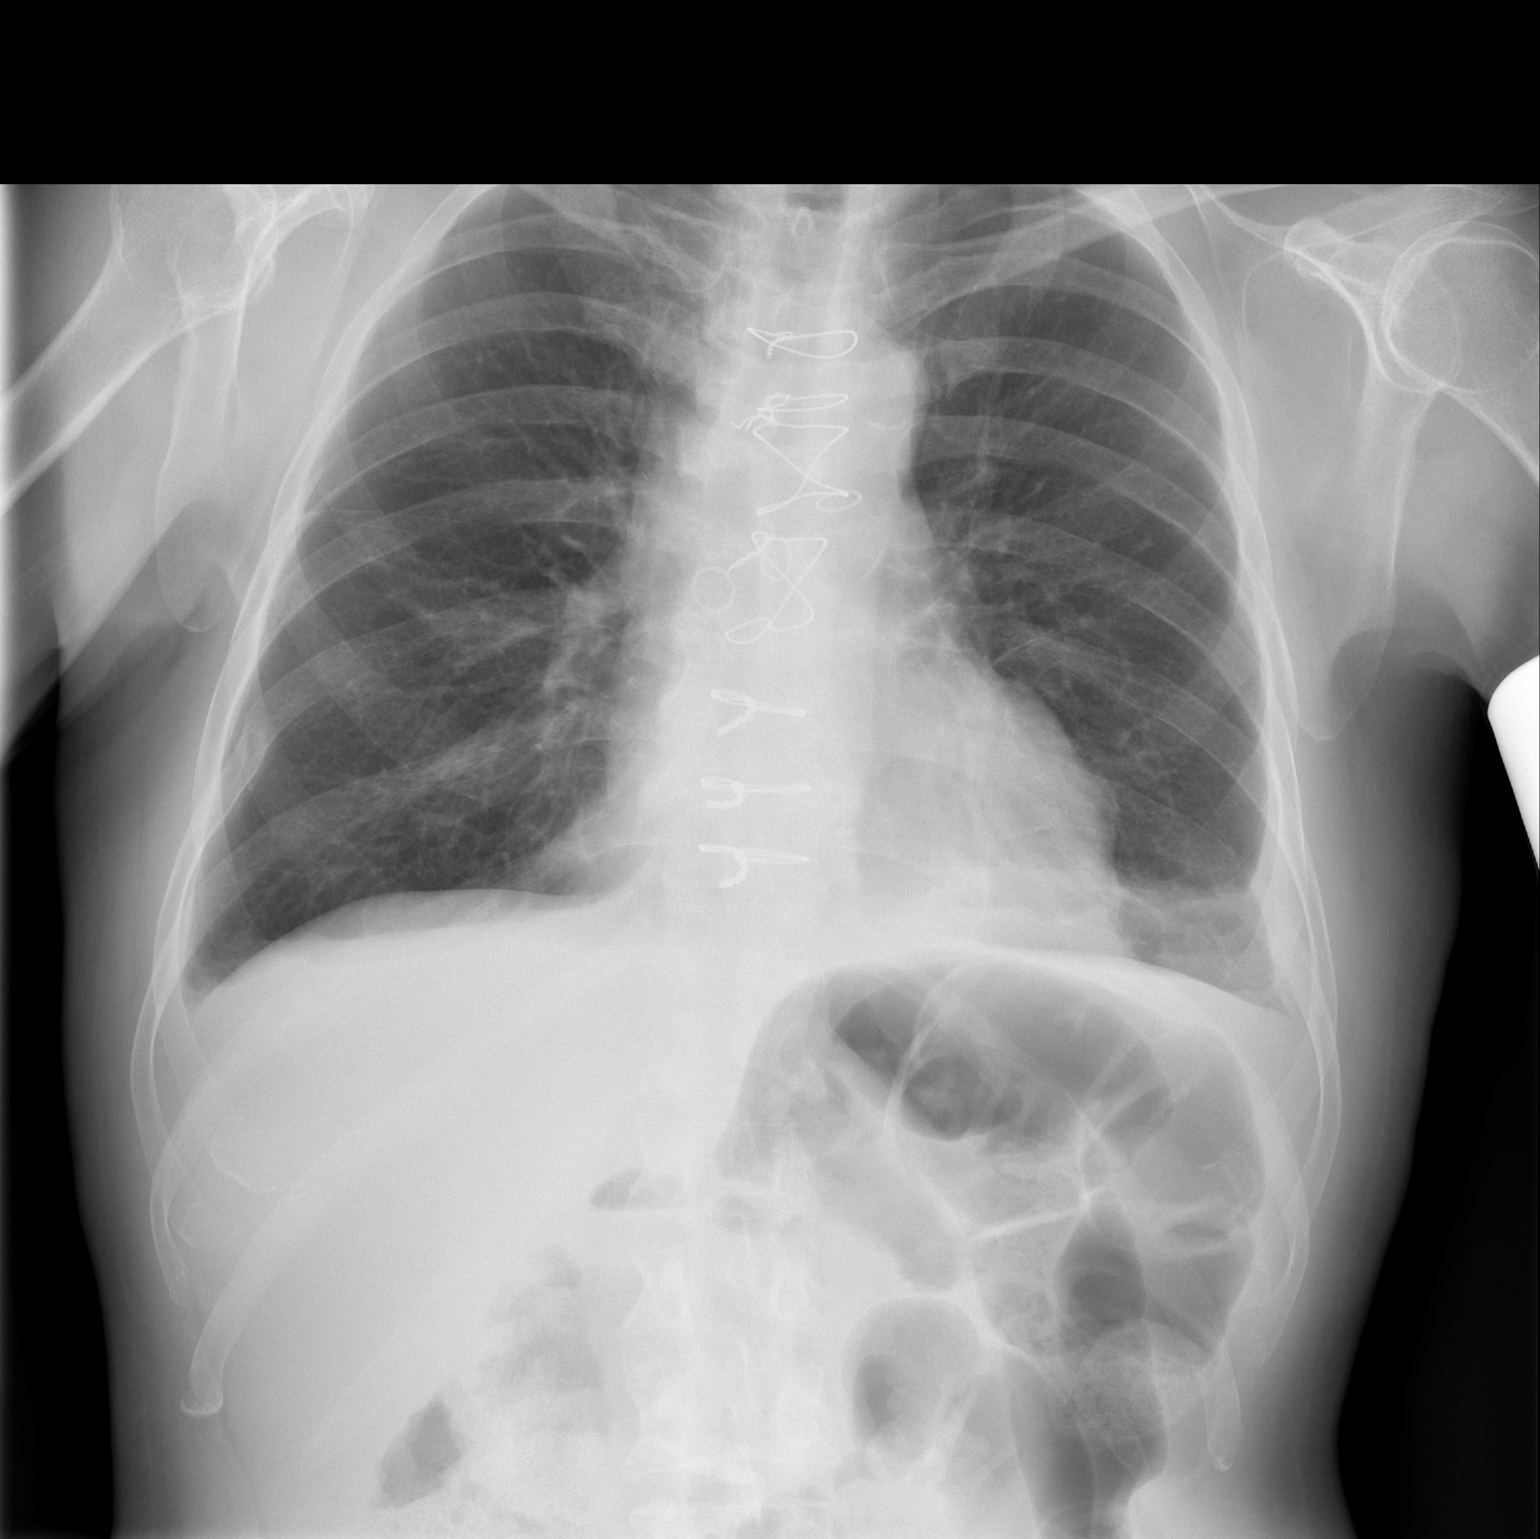

[w chest lat]
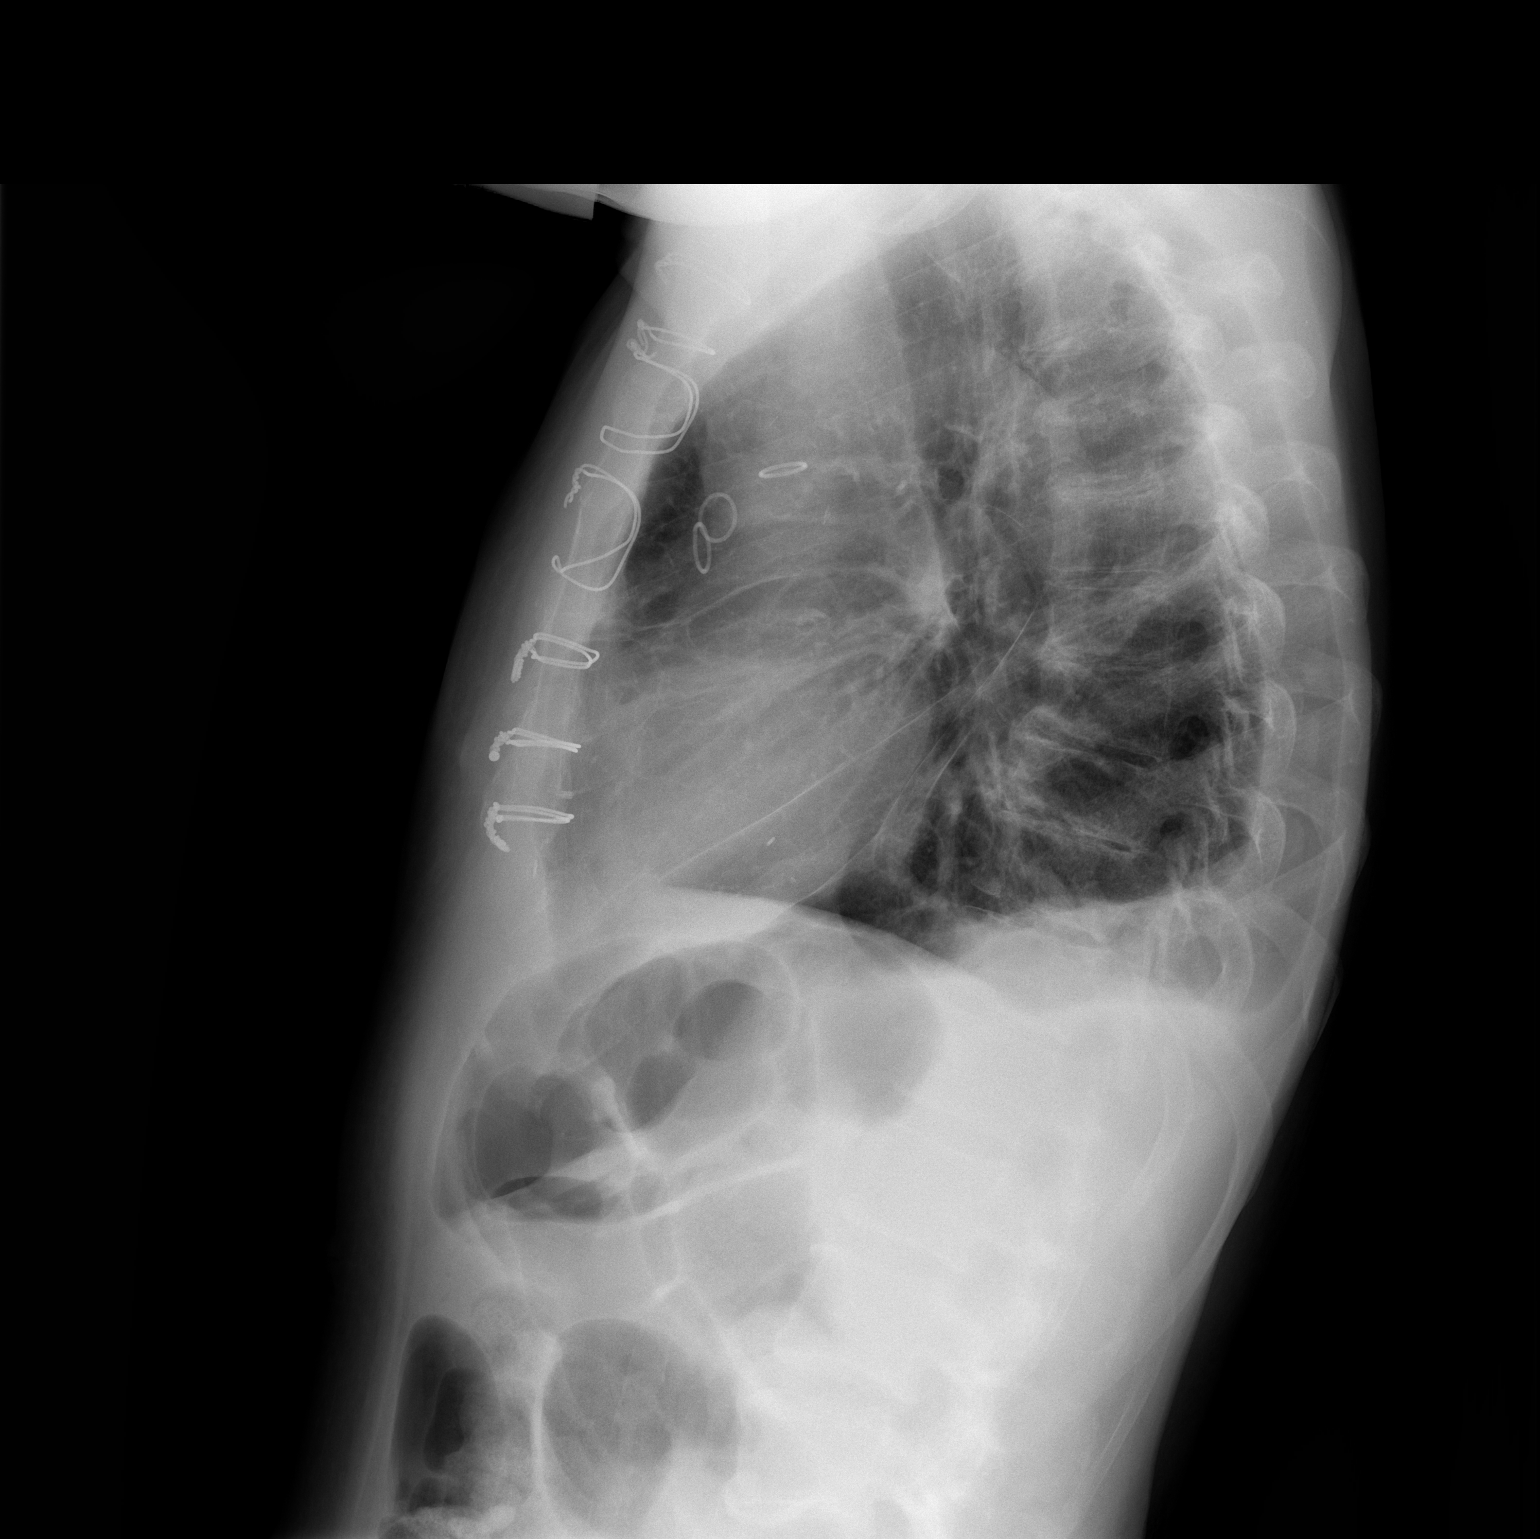

[2 of 2 positions shown; findings below may reference images not displayed]

FINDINGS: Small bilateral pleural effusions. Bibasilar atelectasis, left
greater than right. Heart is normal size. Prior CABG. No edema or
acute bony abnormality.
IMPRESSION: Small bilateral pleural effusions with bibasilar atelectasis, left
greater than right. Findings similar to prior study.

## 2022-06-27 ENCOUNTER — Telehealth: Payer: Self-pay | Admitting: Cardiology

## 2022-06-27 MED ORDER — MIDODRINE HCL 5 MG PO TABS: 5.0000 mg | ORAL_TABLET | Freq: Three times a day (TID) | ORAL | 3 refills | Status: DC

## 2022-06-27 NOTE — Telephone Encounter (Signed)
Spoke with pt per Dr. Vanetta Shawl note. He agreed to increase Midodrine to three times daily. He will call with any issues or problems. Sent to front desk to make follow up appt.

## 2022-06-27 NOTE — Telephone Encounter (Signed)
Spoke with pt. He stated that his blood pressures have been running low this week. This morning it was 78/64. He is feeling tired and sluggish. He is on Midodrine 5mg  1 tablet twice daily. Encouraged to increase fluids.

## 2022-06-27 NOTE — Telephone Encounter (Signed)
Pt c/o BP issue: STAT if pt c/o blurred vision, one-sided weakness or slurred speech  1. What are your last 5 BP readings?  06/27/22 79/64 9:00 am  2. Are you having any other symptoms (ex. Dizziness, headache, blurred vision, passed out)? Sluggish with no energy and lethargic   3. What is your BP issue? Hypotension

## 2022-07-02 DIAGNOSIS — F332 Major depressive disorder, recurrent severe without psychotic features: Secondary | ICD-10-CM | POA: Diagnosis not present

## 2022-07-04 ENCOUNTER — Ambulatory Visit: Payer: Medicare Other | Attending: Cardiology | Admitting: Cardiology

## 2022-07-04 ENCOUNTER — Encounter: Payer: Self-pay | Admitting: Cardiology

## 2022-07-04 VITALS — BP 114/53 | HR 81 | Ht 65.0 in | Wt 155.0 lb

## 2022-07-04 DIAGNOSIS — E782 Mixed hyperlipidemia: Secondary | ICD-10-CM

## 2022-07-04 DIAGNOSIS — Z794 Long term (current) use of insulin: Secondary | ICD-10-CM | POA: Diagnosis not present

## 2022-07-04 DIAGNOSIS — Z951 Presence of aortocoronary bypass graft: Secondary | ICD-10-CM

## 2022-07-04 DIAGNOSIS — I2510 Atherosclerotic heart disease of native coronary artery without angina pectoris: Secondary | ICD-10-CM | POA: Diagnosis not present

## 2022-07-04 DIAGNOSIS — I10 Essential (primary) hypertension: Secondary | ICD-10-CM

## 2022-07-04 DIAGNOSIS — I255 Ischemic cardiomyopathy: Secondary | ICD-10-CM | POA: Diagnosis not present

## 2022-07-04 DIAGNOSIS — E1065 Type 1 diabetes mellitus with hyperglycemia: Secondary | ICD-10-CM | POA: Diagnosis not present

## 2022-07-04 DIAGNOSIS — Z8639 Personal history of other endocrine, nutritional and metabolic disease: Secondary | ICD-10-CM | POA: Diagnosis not present

## 2022-07-04 DIAGNOSIS — I951 Orthostatic hypotension: Secondary | ICD-10-CM

## 2022-07-04 MED ORDER — MIDODRINE HCL 10 MG PO TABS: 10.0000 mg | ORAL_TABLET | Freq: Three times a day (TID) | ORAL | 3 refills | Status: DC

## 2022-07-04 NOTE — Progress Notes (Signed)
Cardiology Office Note:    Date:  07/04/2022   ID:  Jonathan Holder, DOB [birth year], MRN [id number]  PCP:  Jonathan Saupe, MD   Jonathan Holder Providers Cardiologist:  Jonathan Balsam, MD     Referring MD: Jonathan Saupe, MD   CC:  follow up hypotension  History of Present Illness:    Jonathan Holder is a 67 y.o. male with a hx of CAD s/p CABG times 06/24/2019, HFrEF, type I DM, mild bilateral carotid artery stenosis, depression, dyslipidemia, orthostatic hypotension.   Most recently was evaluated by Dr. Bing Holder on 02/12/2022 at that time he was doing well from a cardiac perspective, his main concern was managing his diabetes, he was using an insulin pump.   Echocardiogram 02/22/2022 revealed an EF 40 to 45%, positive RWMA, grade 2 DD, severe akinesis of the LV/apical anterior wall, mild MR.  He contacted our triage line on 06/27/22 with concerns r/t hypotension, his midodrine was increased to TID.   He presents today accompanied by his wife for follow-up of hypotension.  Since his midodrine was increased, he is feeling significantly better.  He presents his blood pressure log, on the days that his readings were in the 70-80 systolic range he had little to no energy.  He did note his blood pressure went slightly too high so he adjusted his third dose of midodrine to 5 mg. He denies chest pain, palpitations, dyspnea, pnd, orthopnea, n, v, dizziness, syncope, edema, weight gain, or early satiety.   Past Medical History:  Diagnosis Date   Anxiety    Arthritis    Carotid artery disease (HCC) 2021   Mild, bilateral   Depression    Diabetes mellitus without complication (HCC)    Essential hypertension 08/20/2019   History of kidney stones    HNP (herniated nucleus pulposus) with myelopathy, cervical 07/31/2016   Hyperlipidemia associated with type 2 diabetes mellitus (HCC) 08/20/2019   Hypertension    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 from [facility]  08/20/2019   Multi-vessel coronary artery stenosis 08/20/2019   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/24/2019   Subsequent ST elevation (STEMI) myocardial infarction of anterior wall within 4 weeks of initial infarction (HCC) 08/20/2019   Type 2 diabetes mellitus with complication, with long-term current use of insulin (HCC) 08/20/2019    Past Surgical History:  Procedure Laterality Date   ANTERIOR CERVICAL DECOMP/DISCECTOMY FUSION N/A 07/31/2016   Procedure: ANTERIOR CERVICAL DECOMPRESSION/DISCECTOMY FUSION CERVICAL FOUR- CERVICAL FIVE, CERVICAL FIVE- CERVICAL SIX, CERVICAL SIX, CERVICAL SEVEN;  Surgeon: Jonathan Kelly, MD;  Location: [facility] OR;  Service: Neurosurgery;  Laterality: N/A;   COLONOSCOPY  04/13/2019   Dr. Jennye Holder, Jonathan Holder. Normal Colonoscopy to the terminal ileum but with a poor prep.   CORONARY ARTERY BYPASS GRAFT N/A 08/24/2019   Procedure: CORONARY ARTERY BYPASS GRAFTING (CABG), ON PUMP, TIMES FIVE, USING BILATERAL INTERNAL MAMMARY ARTERIES, ENDOSCOPICALLY HARVESTED RIGHT GREATER SAPHENOUS VEIN, AND OPEN LEFT RADIAL ARTERY HARVESTING;  Surgeon: Jonathan Dolin, MD;  Location: [facility] OR;  Service: Open Heart Surgery;  Laterality: N/A;   DG THUMB RIGHT HAND ([facility] HX) Left    plate   ESOPHAGOGASTRODUODENOSCOPY  04/13/2019   Dr. Jennye Holder, Jonathan Holder. Mild Scattered gastritis. Small amount of food in the fundus   EYE SURGERY Bilateral    cataract removed   LEFT HEART CATH AND CORONARY ANGIOGRAPHY N/A 08/20/2019   Procedure: LEFT HEART CATH AND CORONARY ANGIOGRAPHY;  Surgeon: Jonathan Lex, MD;  Location: [facility]  INVASIVE CV LAB;  Service: Cardiovascular;  Laterality: N/A;   PENILE PROSTHESIS IMPLANT     RADIAL ARTERY HARVEST Left 08/24/2019   Procedure: RADIAL ARTERY HARVEST;  Surgeon: Jonathan Dolin, MD;  Location: [facility] OR;  Service: Open Heart Surgery;  Laterality: Left;   TEE WITHOUT CARDIOVERSION N/A 08/24/2019   Procedure: TRANSESOPHAGEAL ECHOCARDIOGRAM (TEE);   Surgeon: Jonathan Dolin, MD;  Location: [facility] OR;  Service: Open Heart Surgery;  Laterality: N/A;    Current Medications: Current Meds  Medication Sig   aspirin EC 81 MG EC tablet Take 1 tablet (81 mg total) by mouth daily. Swallow whole.   atorvastatin (LIPITOR) 40 MG tablet Take 40 mg by mouth daily.   clopidogrel (PLAVIX) 75 MG tablet Take 1 tablet (75 mg total) by mouth daily.   desvenlafaxine (PRISTIQ) 100 MG 24 hr tablet Take 100 mg by mouth daily.   famotidine (PEPCID) 20 MG tablet Take 20 mg by mouth daily.   gabapentin (NEURONTIN) 300 MG capsule Take 600 mg by mouth every morning. And 300 mg at night   HUMALOG 100 UNIT/ML injection Inject 1-25 Units into the skin as directed. According to sliding scale   QUEtiapine (SEROQUEL) 200 MG tablet Take 200 mg by mouth at bedtime.   [DISCONTINUED] midodrine (PROAMATINE) 10 MG tablet Take 10 mg by mouth in the morning. And 25 mg at night     Allergies:   Patient has no known allergies.   Social History   Socioeconomic History   Marital status: Married    Spouse name: Not on file   Number of children: Not on file   Years of education: Not on file   Highest education level: Not on file  Occupational History   Occupation: Production designer, theatre/television/film    Comment: [employer]  Tobacco Use   Smoking status: Never   Smokeless tobacco: Never  Vaping Use   Vaping Use: Never used  Substance and Sexual Activity   Alcohol use: No   Drug use: No   Sexual activity: Yes  Other Topics Concern   Not on file  Social History Narrative   Works for [employer] business   Social Determinants of Holder   Financial Resource Strain: Not on file  Food Insecurity: Not on file  Transportation Needs: Not on file  Physical Activity: Not on file  Stress: Not on file  Social Connections: Not on file     Family History: The patient's family history includes Dementia in his mother; Heart disease in his father.  ROS:   Please see the history of  present illness.     All other systems reviewed and are negative.  EKGs/Labs/Other Studies Reviewed:    The following studies were reviewed today: Cardiac Studies & Procedures   CARDIAC CATHETERIZATION  CARDIAC CATHETERIZATION 08/23/2019  Narrative  Mid LAD lesion is 95% stenosed.  Prox Cx lesion is 85% stenosed.  Ramus-1 lesion is 70% stenosed.  Ramus-2 lesion is 80% stenosed.  Prox Cx to Mid Cx lesion is 70% stenosed.  Ramus-3 lesion is 90% stenosed.  Prox RCA-1 lesion is 30% stenosed.  Prox RCA-2 lesion is 30% stenosed.  Mid RCA lesion is 70% stenosed.  RPDA lesion is 70% stenosed.  The left ventricular ejection fraction is 25-35% by visual estimate.  LV end diastolic pressure is mildly elevated.  There is moderate to severe left ventricular systolic dysfunction.  There is no aortic valve stenosis.  Findings Coronary Findings Diagnostic  Dominance: Right  Left Anterior Descending  There is mild diffuse disease throughout the vessel. Mid LAD lesion is 95% stenosed. Vessel is the culprit lesion. The lesion is located proximal to the major branch, focal, discrete and concentric.  First Diagonal Branch Vessel is small in size. There is moderate disease in the vessel.  First Septal Branch Major Septal trunk - several branches  Second Septal Branch Vessel is small in size.  Third Diagonal Branch Vessel is small in size.  Ramus Intermedius Vessel is large. There is moderate diffuse disease throughout the vessel. Ramus-1 lesion is 70% stenosed. The lesion is segmental, eccentric and irregular. Ramus-2 lesion is 80% stenosed. The lesion is segmental, eccentric and irregular. Ramus-3 lesion is 90% stenosed. The lesion is segmental and eccentric.  Left Circumflex Vessel is small. There is mild diffuse disease throughout the vessel. Prox Cx lesion is 85% stenosed. The lesion is located at the bend, focal and discrete. Prox Cx to Mid Cx lesion is 70%  stenosed. The lesion is segmental and concentric.  First Obtuse Marginal Branch Vessel is small in size.  Left Posterior Atrioventricular Artery Vessel is small in size.  Right Coronary Artery There is mild diffuse disease throughout the vessel. Prox RCA-1 lesion is 30% stenosed. Prox RCA-2 lesion is 30% stenosed. Mid RCA lesion is 70% stenosed. The lesion is focal and concentric.  Acute Marginal Branch Vessel is small in size.  Right Ventricular Branch Vessel is small in size.  Right Posterior Descending Artery Vessel is small in size. RPDA lesion is 70% stenosed. The lesion is focal and concentric.  First Right Posterolateral Branch Vessel is small in size.  Intervention  No interventions have been documented.     ECHOCARDIOGRAM  ECHOCARDIOGRAM COMPLETE 02/22/2022  Narrative ECHOCARDIOGRAM REPORT    Patient Name:   JAQUES NADEL Date of Exam: 02/21/2022 Medical Rec #:  [id number]    Height:       65.0 in Accession #:    [id number]   Weight:       161.4 lb Date of Birth:  03/24/1955     BSA:          1.806 m Patient Age:    67 years     BP:           110/72 mmHg Patient Gender: M            HR:           73 bpm. Exam Location:  [place]  Procedure: 2D Echo, Cardiac Doppler, Color Doppler, Strain Analysis and Intracardiac Opacification Agent  Indications:    Dyspnea on exertion [R06.09 (ICD-10-CM)]  History:        Patient has prior history of Echocardiogram examinations, most recent 04/03/2021. CAD and Previous Myocardial Infarction, 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; Risk Factors:Hypertension, Diabetes and Dyslipidemia.  Sonographer:    Louie Boston RDCS Referring Phys: [id number] Marveen Reeks KRASOWSKI  IMPRESSIONS   1. Left ventricular ejection fraction, by estimation, is 45 to 50%. The left ventricle has mildly decreased function. The left ventricle demonstrates regional wall motion abnormalities (see scoring diagram/findings for description). Left ventricular diastolic  parameters are consistent with Grade II diastolic dysfunction (pseudonormalization). There is severe akinesis of the left ventricular, apical anterior wall. 2. Right ventricular systolic function is normal. The right ventricular size is normal. There is normal pulmonary artery systolic pressure. 3. The mitral valve is normal in structure. Mild mitral valve regurgitation. No evidence of mitral stenosis. 4. The aortic valve is normal in structure. Aortic valve  regurgitation is not visualized. Aortic valve sclerosis/calcification is present, without any evidence of aortic stenosis. 5. The inferior vena cava is normal in size with greater than 50% respiratory variability, suggesting right atrial pressure of 3 mmHg.  FINDINGS Left Ventricle: Left ventricular ejection fraction, by estimation, is 45 to 50%. The left ventricle has mildly decreased function. The left ventricle demonstrates regional wall motion abnormalities. Severe akinesis of the left ventricular, apical anterior wall. Definity contrast agent was given IV to delineate the left ventricular endocardial borders. The left ventricular internal cavity size was normal in size. There is no left ventricular hypertrophy. Left ventricular diastolic parameters are consistent with Grade II diastolic dysfunction (pseudonormalization).   LV Wall Scoring: The apex is akinetic.  Right Ventricle: The right ventricular size is normal. No increase in right ventricular wall thickness. Right ventricular systolic function is normal. There is normal pulmonary artery systolic pressure. The tricuspid regurgitant velocity is 2.17 m/s, and with an assumed right atrial pressure of 3 mmHg, the estimated right ventricular systolic pressure is 21.8 mmHg.  Left Atrium: Left atrial size was normal in size.  Right Atrium: Right atrial size was normal in size.  Pericardium: There is no evidence of pericardial effusion.  Mitral Valve: The mitral valve is normal in  structure. Mild mitral valve regurgitation. No evidence of mitral valve stenosis.  Tricuspid Valve: The tricuspid valve is normal in structure. Tricuspid valve regurgitation is mild . No evidence of tricuspid stenosis.  Aortic Valve: The aortic valve is normal in structure. Aortic valve regurgitation is not visualized. Aortic valve sclerosis/calcification is present, without any evidence of aortic stenosis.  Pulmonic Valve: The pulmonic valve was normal in structure. Pulmonic valve regurgitation is not visualized. No evidence of pulmonic stenosis.  Aorta: The aortic root is normal in size and structure.  Venous: The inferior vena cava is normal in size with greater than 50% respiratory variability, suggesting right atrial pressure of 3 mmHg.  IAS/Shunts: No atrial level shunt detected by color flow Doppler.   LEFT VENTRICLE PLAX 2D LVIDd:         5.10 cm     Diastology LVIDs:         4.00 cm     LV e' medial:    3.14 cm/s LV PW:         0.90 cm     LV E/e' medial:  32.2 LV IVS:        0.90 cm     LV e' lateral:   7.27 cm/s LVOT diam:     1.90 cm     LV E/e' lateral: 13.9 LV SV:         64 LV SV Index:   35 LVOT Area:     2.84 cm  LV Volumes (MOD) LV vol d, MOD A2C: 68.9 ml LV vol d, MOD A4C: 71.8 ml LV vol s, MOD A2C: 33.7 ml LV vol s, MOD A4C: 39.4 ml LV SV MOD A2C:     35.2 ml LV SV MOD A4C:     71.8 ml LV SV MOD BP:      36.6 ml  RIGHT VENTRICLE            IVC RV S prime:     6.60 cm/s  IVC diam: 1.50 cm TAPSE (M-mode): 1.0 cm  LEFT ATRIUM             Index        RIGHT ATRIUM  Index LA diam:        3.30 cm 1.83 cm/m   RA Area:     12.10 cm LA Vol (A2C):   50.7 ml 28.08 ml/m  RA Volume:   24.50 ml  13.57 ml/m LA Vol (A4C):   38.0 ml 21.04 ml/m LA Biplane Vol: 45.2 ml 25.03 ml/m AORTIC VALVE LVOT Vmax:   100.00 cm/s LVOT Vmean:  71.600 cm/s LVOT VTI:    0.225 m  AORTA Ao Root diam: 3.10 cm Ao Asc diam:  3.20 cm Ao Desc diam: 2.10 cm  MITRAL  VALVE                TRICUSPID VALVE MV Area (PHT): 5.42 cm     TR Peak grad:   18.8 mmHg MV Decel Time: 140 msec     TR Vmax:        217.00 cm/s MV E velocity: 101.00 cm/s MV A velocity: 84.80 cm/s   SHUNTS MV E/A ratio:  1.19         Systemic VTI:  0.22 m Systemic Diam: 1.90 cm  Jonathan Balsam MD Electronically signed by Jonathan Balsam MD Signature Date/Time: 02/22/2022/10:13:07 AM    Final   TEE  ECHO INTRAOPERATIVE TEE 08/24/2019  Narrative *INTRAOPERATIVE TRANSESOPHAGEAL REPORT *    Patient Name:   DAMARRIUS KULAS   Date of Exam: 08/24/2019 Medical Rec #:  [id number]      Height:       64.0 in Accession #:    [id number]     Weight:       136.9 lb Date of Birth:  09-Sep-1955       BSA:          1.67 m Patient Age:    64 years       BP:           119/50 mmHg Patient Gender: M              HR:           52 bpm. Exam Location:  Anesthesiology  Transesophogeal exam was perform intraoperatively during surgical procedure. Patient was closely monitored under general anesthesia during the entirety of examination.  Indications:     CABG. CAD Sonographer:     Celene Skeen RDCS (AE) Performing Phys: [id number] BROADUS Z ATKINS  Complications: No known complications during this procedure. POST-OP IMPRESSIONS Overall, there were no significant changes from pre-bypass.  PRE-OP FINDINGS Left Ventricle: The left ventricle has mild-moderately reduced systolic function, with an ejection fraction of 40-45%. The cavity size was moderately dilated. The mid papillary region towards the base of the heart has fairly normal function. There is akinesis of the apex of the heart. There is no increase in left ventricular wall thickness.  Right Ventricle: The right ventricle has normal systolic function. The cavity was normal. There is no increase in right ventricular wall thickness.  Left Atrium: Left atrial size was not assessed. The left atrial appendage is well visualized and there is no  evidence of thrombus present.  Right Atrium: Right atrial size is dilated.  Interatrial Septum: No atrial level shunt detected by color flow Doppler.  Pericardium: A small pericardial effusion is present. The pericardial effusion is circumferential. There is no evidence of cardiac tamponade.  Mitral Valve: The mitral valve is normal in structure. No thickening of the mitral valve leaflet. No calcification of the mitral valve leaflet. Mitral valve regurgitation is trivial by color flow Doppler.  Tricuspid Valve: The  tricuspid valve was normal in structure. Tricuspid valve regurgitation is trivial by color flow Doppler.  Aortic Valve: The aortic valve is normal in structure. Aortic valve regurgitation was not visualized by color flow Doppler. There is no evidence of aortic valve stenosis.  Pulmonic Valve: The pulmonic valve was normal in structure. Pulmonic valve regurgitation is not visualized by color flow Doppler.   Compared to previous exam:The pericardial effusion is no longer present. +--------------+--------++ LEFT VENTRICLE         +--------------+--------++ PLAX 2D                +--------------+--------++ LVIDd:        4.93 cm  +--------------+--------++ LVIDs:        3.53 cm  +--------------+--------++ LVOT diam:    2.05 cm  +--------------+--------++ LV SV:        63 ml    +--------------+--------++ LV SV Index:  37.16    +--------------+--------++ LVOT Area:    3.30 cm +--------------+--------++                        +--------------+--------++  +------------------+---------++ LV Volumes (MOD)            +------------------+---------++ LV area d, A4C:   21.80 cm +------------------+---------++ LV area s, A4C:   14.90 cm +------------------+---------++ LV major d, A4C:  6.75 cm   +------------------+---------++ LV major s, A4C:  6.31 cm   +------------------+---------++ LV vol d, MOD A4C:55.1 ml    +------------------+---------++ LV vol s, MOD A4C:28.8 ml   +------------------+---------++ LV SV MOD A4C:    55.1 ml   +------------------+---------++  +------------------+-----------++ AORTIC VALVE                  +------------------+-----------++ AV Area (Vmax):   2.02 cm    +------------------+-----------++ AV Area (Vmean):  2.05 cm    +------------------+-----------++ AV Area (VTI):    2.37 cm    +------------------+-----------++ AV Vmax:          138.00 cm/s +------------------+-----------++ AV Vmean:         93.000 cm/s +------------------+-----------++ AV VTI:           0.273 m     +------------------+-----------++ AV Peak Grad:     7.6 mmHg    +------------------+-----------++ AV Mean Grad:     4.0 mmHg    +------------------+-----------++ LVOT Vmax:        84.40 cm/s  +------------------+-----------++ LVOT Vmean:       57.700 cm/s +------------------+-----------++ LVOT VTI:         0.196 m     +------------------+-----------++ LVOT/AV VTI ratio:0.72        +------------------+-----------++   +--------------+-------+ SHUNTS                +--------------+-------+ Systemic VTI: 0.20 m  +--------------+-------+ Systemic Diam:2.05 cm +--------------+-------+   Gaynelle Adu MD Electronically signed by Gaynelle Adu MD Signature Date/Time: 08/24/2019/2:41:49 PM    Final             EKG:  EKG is not ordered today.    Recent Labs: 10/01/2021: BUN 24; Creatinine, Ser 1.67; Potassium 5.4; Sodium 144 02/21/2022: ALT 30  Recent Lipid Panel    Component Value Date/Time   CHOL 124 02/21/2022 0845   TRIG 95 02/21/2022 0845   HDL 46 02/21/2022 0845   CHOLHDL 2.7 02/21/2022 0845   CHOLHDL 2.9 08/21/2019 0757   VLDL 8 08/21/2019 0757   LDLCALC 60 02/21/2022 0845  Risk Assessment/Calculations:                Physical Exam:    VS:  BP (!) 114/53 (BP Location:  Left Arm, Patient Position: Sitting, Cuff Size: Normal)   Pulse 81   Ht 5\' 5"  (1.651 m)   Wt 155 lb (70.3 kg)   SpO2 97%   BMI 25.79 kg/m     Wt Readings from Last 3 Encounters:  07/04/22 155 lb (70.3 kg)  02/12/22 161 lb 6.4 oz (73.2 kg)  05/21/21 152 lb (68.9 kg)     GEN:  Well nourished, well developed in no acute distress HEENT: Normal NECK: No JVD; No carotid bruits LYMPHATICS: No lymphadenopathy CARDIAC: RRR, no murmurs, rubs, gallops RESPIRATORY:  Clear to auscultation without rales, wheezing or rhonchi  ABDOMEN: Soft, non-tender, non-distended MUSCULOSKELETAL:  No edema; No deformity  SKIN: Warm and dry NEUROLOGIC:  Alert and oriented x 3 PSYCHIATRIC:  Normal affect   ASSESSMENT:    1. Multi-vessel coronary artery stenosis   2. 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 Ischemic cardiomyopathy   4. Mixed hyperlipidemia   5. Essential hypertension   6. Orthostatic hypotension    PLAN:    In order of problems listed above:  CAD-s/p CABG x 5 in 2021. Stable with no anginal symptoms. No indication for ischemic evaluation.  Continue aspirin 81 mg daily, continue Plavix 75 mg daily, continue Lipitor 40 mg daily.  Will check CBC for signs of anemia. Ischemic cardiomyopathy-most recent echocardiogram in January 2024 revealed an EF 45 to 50%, positive RWMA, grade 2 DD.  NYHA class I, euvolemic.  GDMT is prohibited secondary to orthostasis and hypotension.  Beta-blocker previously stopped secondary to hypotension.  He has type I DM so he is not a candidate for SGLT2 inhibitors. Hypotension-blood pressure today is 114/53, he is feeling well, continue midodrine 10 mg 3 times a day--he typically happens the third dose to 5 mg in the evening.  Will repeat BMET. Hyperlipidemia -most recent LDL was 60, will repeat direct LDL. CKD -most recent GFR was 43, will repeat his BMET today since his midodrine dose has been increased. Careful titration of diuretic and antihypertensive.        Disposition-CBC, BMET.  Return in 3 months.   Medication Adjustments/Labs and Tests Ordered: Current medicines are reviewed at length with the patient today.  Concerns regarding medicines are outlined above.  Orders Placed This Encounter  Procedures   Basic metabolic panel   CBC   LDL cholesterol, direct   EKG 12-Lead   Meds ordered this encounter  Medications   midodrine (PROAMATINE) 10 MG tablet    Sig: Take 1 tablet (10 mg total) by mouth 3 (three) times daily.    Dispense:  270 tablet    Refill:  3    Patient Instructions  Medication Instructions:  Your physician has recommended you make the following change in your medication:   Midodrine 10 mg three times daily  *If you need a refill on your cardiac medications before your next appointment, please call your pharmacy*   Lab Work: Your physician recommends that you have labs done in the office today. Your test included a BMP, CBC and direct LDL.  If you have labs (blood work) drawn today and your tests are completely normal, you will receive your results only by: MyChart Message (if you have MyChart) OR A paper copy in the mail If you have any lab test that is abnormal  or we need to change your treatment, we will call you to review the results.   Testing/Procedures: None ordered   Follow-Up: At [facility], you and your Holder needs are our priority.  As part of our continuing mission to provide you with exceptional heart care, we have created designated Provider Care Teams.  These Care Teams include your primary Cardiologist (physician) and Advanced Practice Providers (APPs -  Physician Assistants and Nurse Practitioners) who all work together to provide you with the care you need, when you need it.  We recommend signing up for the patient portal called "MyChart".  Sign up information is provided on this After Visit Summary.  MyChart is used to connect with patients for Virtual Visits (Telemedicine).   Patients are able to view lab/test results, encounter notes, upcoming appointments, etc.  Non-urgent messages can be sent to your provider as well.   To learn more about what you can do with MyChart, go to [email or web address].    Your next appointment:   3 month(s)  The format for your next appointment:   In Person  Provider:   Belva Crome, MD    Other Instructions none  Important Information About Sugar        Signed, Flossie Dibble, NP  07/04/2022 4:09 PM    [place] [facility]

## 2022-07-04 NOTE — Patient Instructions (Addendum)
Medication Instructions:  Your physician has recommended you make the following change in your medication:   Midodrine 10 mg three times daily  *If you need a refill on your cardiac medications before your next appointment, please call your pharmacy*   Lab Work: Your physician recommends that you have labs done in the office today. Your test included a BMP, CBC and direct LDL.  If you have labs (blood work) drawn today and your tests are completely normal, you will receive your results only by: MyChart Message (if you have MyChart) OR A paper copy in the mail If you have any lab test that is abnormal or we need to change your treatment, we will call you to review the results.   Testing/Procedures: None ordered   Follow-Up: At [facility], you and your health needs are our priority.  As part of our continuing mission to provide you with exceptional heart care, we have created designated Provider Care Teams.  These Care Teams include your primary Cardiologist (physician) and Advanced Practice Providers (APPs -  Physician Assistants and Nurse Practitioners) who all work together to provide you with the care you need, when you need it.  We recommend signing up for the patient portal called "MyChart".  Sign up information is provided on this After Visit Summary.  MyChart is used to connect with patients for Virtual Visits (Telemedicine).  Patients are able to view lab/test results, encounter notes, upcoming appointments, etc.  Non-urgent messages can be sent to your provider as well.   To learn more about what you can do with MyChart, go to [email or web address].    Your next appointment:   3 month(s)  The format for your next appointment:   In Person  Provider:   Belva Crome, MD    Other Instructions none  Important Information About Sugar

## 2022-07-05 ENCOUNTER — Other Ambulatory Visit: Payer: Self-pay

## 2022-07-05 ENCOUNTER — Telehealth: Payer: Self-pay

## 2022-07-05 DIAGNOSIS — N1831 Chronic kidney disease, stage 3a: Secondary | ICD-10-CM

## 2022-07-05 LAB — BASIC METABOLIC PANEL WITH GFR
BUN/Creatinine Ratio: 18 (ref 10–24)
BUN: 35 mg/dL — ABNORMAL HIGH (ref 8–27)
CO2: 25 mmol/L (ref 20–29)
Calcium: 9.4 mg/dL (ref 8.6–10.2)
Chloride: 107 mmol/L — ABNORMAL HIGH (ref 96–106)
Creatinine, Ser: 1.93 mg/dL — ABNORMAL HIGH (ref 0.76–1.27)
Glucose: 122 mg/dL — ABNORMAL HIGH (ref 70–99)
Potassium: 5.1 mmol/L (ref 3.5–5.2)
Sodium: 145 mmol/L — ABNORMAL HIGH (ref 134–144)
eGFR: 38 mL/min/1.73 — ABNORMAL LOW

## 2022-07-05 LAB — CBC
Hematocrit: 38.6 % (ref 37.5–51.0)
Hemoglobin: 13 g/dL (ref 13.0–17.7)
MCH: 31.8 pg (ref 26.6–33.0)
MCHC: 33.7 g/dL (ref 31.5–35.7)
MCV: 94 fL (ref 79–97)
Platelets: 214 x10E3/uL (ref 150–450)
RBC: 4.09 x10E6/uL — ABNORMAL LOW (ref 4.14–5.80)
RDW: 13.7 % (ref 11.6–15.4)
WBC: 11.2 x10E3/uL — ABNORMAL HIGH (ref 3.4–10.8)

## 2022-07-05 LAB — LDL CHOLESTEROL, DIRECT: LDL Direct: 61 mg/dL (ref 0–99)

## 2022-07-05 NOTE — Telephone Encounter (Signed)
Results reviewed with pt as per Jennifer Woody PA's note.  Pt verbalized understanding and had no additional questions. Routed to PCP.  

## 2022-07-05 NOTE — Telephone Encounter (Signed)
-----   Message from Flossie Dibble, NP sent at 07/05/2022  7:47 AM EDT ----- Mr. Rossow, Your cholesterol is good! Your kidney function is slightly worse than it previously was, which could happen with the midodrine. I want to recheck it in two weeks. Until then, make sure you are staying well hydrated with water. Aim for at least 64 ounces of water/day. Return in 2 weeks for repeat BMET.  Best, Victorino Dike

## 2022-07-11 DIAGNOSIS — E1065 Type 1 diabetes mellitus with hyperglycemia: Secondary | ICD-10-CM | POA: Diagnosis not present

## 2022-07-11 DIAGNOSIS — Z794 Long term (current) use of insulin: Secondary | ICD-10-CM | POA: Diagnosis not present

## 2022-07-11 DIAGNOSIS — Z8639 Personal history of other endocrine, nutritional and metabolic disease: Secondary | ICD-10-CM | POA: Diagnosis not present

## 2022-07-18 DIAGNOSIS — Z8639 Personal history of other endocrine, nutritional and metabolic disease: Secondary | ICD-10-CM | POA: Diagnosis not present

## 2022-07-18 DIAGNOSIS — E1065 Type 1 diabetes mellitus with hyperglycemia: Secondary | ICD-10-CM | POA: Diagnosis not present

## 2022-07-18 DIAGNOSIS — Z794 Long term (current) use of insulin: Secondary | ICD-10-CM | POA: Diagnosis not present

## 2022-07-23 DIAGNOSIS — E113393 Type 2 diabetes mellitus with moderate nonproliferative diabetic retinopathy without macular edema, bilateral: Secondary | ICD-10-CM | POA: Diagnosis not present

## 2022-07-25 ENCOUNTER — Other Ambulatory Visit: Payer: Self-pay | Admitting: Cardiology

## 2022-07-25 DIAGNOSIS — Z794 Long term (current) use of insulin: Secondary | ICD-10-CM | POA: Diagnosis not present

## 2022-07-25 DIAGNOSIS — Z8639 Personal history of other endocrine, nutritional and metabolic disease: Secondary | ICD-10-CM | POA: Diagnosis not present

## 2022-07-25 DIAGNOSIS — E1065 Type 1 diabetes mellitus with hyperglycemia: Secondary | ICD-10-CM | POA: Diagnosis not present

## 2022-07-30 DIAGNOSIS — E1065 Type 1 diabetes mellitus with hyperglycemia: Secondary | ICD-10-CM | POA: Diagnosis not present

## 2022-08-01 DIAGNOSIS — E1065 Type 1 diabetes mellitus with hyperglycemia: Secondary | ICD-10-CM | POA: Diagnosis not present

## 2022-08-01 DIAGNOSIS — Z8639 Personal history of other endocrine, nutritional and metabolic disease: Secondary | ICD-10-CM | POA: Diagnosis not present

## 2022-08-01 DIAGNOSIS — Z794 Long term (current) use of insulin: Secondary | ICD-10-CM | POA: Diagnosis not present

## 2022-08-02 DIAGNOSIS — E875 Hyperkalemia: Secondary | ICD-10-CM | POA: Diagnosis not present

## 2022-08-08 DIAGNOSIS — Z8639 Personal history of other endocrine, nutritional and metabolic disease: Secondary | ICD-10-CM | POA: Diagnosis not present

## 2022-08-08 DIAGNOSIS — E1065 Type 1 diabetes mellitus with hyperglycemia: Secondary | ICD-10-CM | POA: Diagnosis not present

## 2022-08-08 DIAGNOSIS — Z794 Long term (current) use of insulin: Secondary | ICD-10-CM | POA: Diagnosis not present

## 2022-08-15 DIAGNOSIS — Z8639 Personal history of other endocrine, nutritional and metabolic disease: Secondary | ICD-10-CM | POA: Diagnosis not present

## 2022-08-15 DIAGNOSIS — Z794 Long term (current) use of insulin: Secondary | ICD-10-CM | POA: Diagnosis not present

## 2022-08-15 DIAGNOSIS — E1065 Type 1 diabetes mellitus with hyperglycemia: Secondary | ICD-10-CM | POA: Diagnosis not present

## 2022-08-22 DIAGNOSIS — E1065 Type 1 diabetes mellitus with hyperglycemia: Secondary | ICD-10-CM | POA: Diagnosis not present

## 2022-08-22 DIAGNOSIS — Z794 Long term (current) use of insulin: Secondary | ICD-10-CM | POA: Diagnosis not present

## 2022-08-22 DIAGNOSIS — Z8639 Personal history of other endocrine, nutritional and metabolic disease: Secondary | ICD-10-CM | POA: Diagnosis not present

## 2022-08-26 ENCOUNTER — Telehealth: Payer: Self-pay | Admitting: Cardiology

## 2022-08-26 NOTE — Telephone Encounter (Signed)
Pt c/o BP issue: STAT if pt c/o blurred vision, one-sided weakness or slurred speech  1. What are your last 5 BP readings?  100/50  2. Are you having any other symptoms (ex. Dizziness, headache, blurred vision, passed out)? Weak and lethargic   3. What is your BP issue? Patient says that his bp is too low and would like to know what he needs to do.

## 2022-08-26 NOTE — Telephone Encounter (Signed)
Pt reports that he continues to have times where his BP is 97/60. Pt takes Midodrine. Advised to take in extra salt, drink gatorade zero and plenty of fluids and elevate legs if possible. Advised to callback in a few days to let us know how he is feeling.

## 2022-08-29 DIAGNOSIS — Z8639 Personal history of other endocrine, nutritional and metabolic disease: Secondary | ICD-10-CM | POA: Diagnosis not present

## 2022-08-29 DIAGNOSIS — E1065 Type 1 diabetes mellitus with hyperglycemia: Secondary | ICD-10-CM | POA: Diagnosis not present

## 2022-08-29 DIAGNOSIS — Z794 Long term (current) use of insulin: Secondary | ICD-10-CM | POA: Diagnosis not present

## 2022-09-05 DIAGNOSIS — E1065 Type 1 diabetes mellitus with hyperglycemia: Secondary | ICD-10-CM | POA: Diagnosis not present

## 2022-09-05 DIAGNOSIS — Z794 Long term (current) use of insulin: Secondary | ICD-10-CM | POA: Diagnosis not present

## 2022-09-05 DIAGNOSIS — Z8639 Personal history of other endocrine, nutritional and metabolic disease: Secondary | ICD-10-CM | POA: Diagnosis not present

## 2022-09-11 ENCOUNTER — Telehealth: Payer: Self-pay

## 2022-09-11 ENCOUNTER — Telehealth: Payer: Self-pay | Admitting: Cardiology

## 2022-09-11 DIAGNOSIS — E860 Dehydration: Secondary | ICD-10-CM | POA: Diagnosis not present

## 2022-09-11 DIAGNOSIS — Z7902 Long term (current) use of antithrombotics/antiplatelets: Secondary | ICD-10-CM | POA: Diagnosis not present

## 2022-09-11 DIAGNOSIS — E8729 Other acidosis: Secondary | ICD-10-CM | POA: Diagnosis not present

## 2022-09-11 DIAGNOSIS — R9431 Abnormal electrocardiogram [ECG] [EKG]: Secondary | ICD-10-CM | POA: Diagnosis not present

## 2022-09-11 DIAGNOSIS — R42 Dizziness and giddiness: Secondary | ICD-10-CM | POA: Diagnosis not present

## 2022-09-11 DIAGNOSIS — N179 Acute kidney failure, unspecified: Secondary | ICD-10-CM | POA: Diagnosis not present

## 2022-09-11 DIAGNOSIS — E119 Type 2 diabetes mellitus without complications: Secondary | ICD-10-CM | POA: Diagnosis not present

## 2022-09-11 DIAGNOSIS — Z794 Long term (current) use of insulin: Secondary | ICD-10-CM | POA: Diagnosis not present

## 2022-09-11 DIAGNOSIS — I2510 Atherosclerotic heart disease of native coronary artery without angina pectoris: Secondary | ICD-10-CM | POA: Diagnosis not present

## 2022-09-11 DIAGNOSIS — I10 Essential (primary) hypertension: Secondary | ICD-10-CM | POA: Diagnosis not present

## 2022-09-11 DIAGNOSIS — D509 Iron deficiency anemia, unspecified: Secondary | ICD-10-CM | POA: Diagnosis not present

## 2022-09-11 DIAGNOSIS — J984 Other disorders of lung: Secondary | ICD-10-CM | POA: Diagnosis not present

## 2022-09-11 DIAGNOSIS — Z951 Presence of aortocoronary bypass graft: Secondary | ICD-10-CM | POA: Diagnosis not present

## 2022-09-11 DIAGNOSIS — I951 Orthostatic hypotension: Secondary | ICD-10-CM | POA: Diagnosis not present

## 2022-09-11 DIAGNOSIS — Z79899 Other long term (current) drug therapy: Secondary | ICD-10-CM | POA: Diagnosis not present

## 2022-09-11 DIAGNOSIS — R531 Weakness: Secondary | ICD-10-CM | POA: Diagnosis not present

## 2022-09-11 DIAGNOSIS — Z7982 Long term (current) use of aspirin: Secondary | ICD-10-CM | POA: Diagnosis not present

## 2022-09-11 DIAGNOSIS — I959 Hypotension, unspecified: Secondary | ICD-10-CM | POA: Diagnosis not present

## 2022-09-11 DIAGNOSIS — Z20822 Contact with and (suspected) exposure to covid-19: Secondary | ICD-10-CM | POA: Diagnosis not present

## 2022-09-11 NOTE — Telephone Encounter (Signed)
Received a STAT call from the patient engagement center. Patient reported that his blood pressure was 195/72 and he took it again when he left for work and it was 170/95. Patient is currently at work and feels very dizzy and weak and feels like he is slurring his words when he speaks. Based on these symptoms I recommended that the patient go to the ER to be evaluated. I asked him not to drive and to call EMS. He stated that he would call his wife to come and take him to the ER. I encouraged him to go to the ER as soon as possible. Patient verbalized understanding and had no further questions at this time.

## 2022-09-11 NOTE — Telephone Encounter (Signed)
  STAT if patient feels like he/she is going to faint   1. Are you feeling dizzy, lightheaded, or faint right now?  Yes   2. Have you passed out?  no (If yes move to .[facility])   3. Do you have any other symptoms? Feels like speech is slur and really sleepy   4. Have you checked your HR and BP (record if available)?  No   Pt said, he felt dizzy when he woke up this morning and feel like he is going to faint

## 2022-09-12 DIAGNOSIS — Z8639 Personal history of other endocrine, nutritional and metabolic disease: Secondary | ICD-10-CM | POA: Diagnosis not present

## 2022-09-12 DIAGNOSIS — E1065 Type 1 diabetes mellitus with hyperglycemia: Secondary | ICD-10-CM | POA: Diagnosis not present

## 2022-09-12 DIAGNOSIS — Z794 Long term (current) use of insulin: Secondary | ICD-10-CM | POA: Diagnosis not present

## 2022-09-12 DIAGNOSIS — I951 Orthostatic hypotension: Secondary | ICD-10-CM | POA: Diagnosis not present

## 2022-09-12 DIAGNOSIS — N179 Acute kidney failure, unspecified: Secondary | ICD-10-CM | POA: Diagnosis not present

## 2022-09-12 DIAGNOSIS — E8729 Other acidosis: Secondary | ICD-10-CM | POA: Diagnosis not present

## 2022-09-19 DIAGNOSIS — Z8639 Personal history of other endocrine, nutritional and metabolic disease: Secondary | ICD-10-CM | POA: Diagnosis not present

## 2022-09-19 DIAGNOSIS — K2970 Gastritis, unspecified, without bleeding: Secondary | ICD-10-CM | POA: Diagnosis not present

## 2022-09-19 DIAGNOSIS — Z794 Long term (current) use of insulin: Secondary | ICD-10-CM | POA: Diagnosis not present

## 2022-09-19 DIAGNOSIS — E119 Type 2 diabetes mellitus without complications: Secondary | ICD-10-CM | POA: Diagnosis not present

## 2022-09-19 DIAGNOSIS — E1065 Type 1 diabetes mellitus with hyperglycemia: Secondary | ICD-10-CM | POA: Diagnosis not present

## 2022-09-19 DIAGNOSIS — R7989 Other specified abnormal findings of blood chemistry: Secondary | ICD-10-CM | POA: Diagnosis not present

## 2022-09-19 DIAGNOSIS — K5900 Constipation, unspecified: Secondary | ICD-10-CM | POA: Diagnosis not present

## 2022-09-20 ENCOUNTER — Telehealth: Payer: Self-pay | Admitting: Cardiology

## 2022-09-20 NOTE — Telephone Encounter (Signed)
   Pre-operative Risk Assessment    Patient Name: Jonathan Holder  DOB: 10-Jan-1956 MRN: [id number]      Request for Surgical Clearance    Procedure:   Colonoscopy  Date of Surgery:  Clearance 10/24/22                                 Surgeon:  Dr. Tollie Pizza [place] Surgeon's Group or Practice Name:  [facility]  Phone number:  Unknown Fax number:  [phone]   Type of Clearance Requested:   - Pharmacy:  Hold Aspirin and Clopidogrel (Plavix)     Type of Anesthesia:  Not Indicated   Additional requests/questions:    [name]   09/20/2022, 4:51 PM

## 2022-09-23 NOTE — Telephone Encounter (Signed)
Pt has appt with Dr. Bing Matter 10/10/22. Procedure is 10/24/22. Ok per pre op APP to defer clearance assessment to MD appt 10/10/22. I will update all parties involved.

## 2022-09-23 NOTE — Telephone Encounter (Signed)
   Name: Jonathan Holder  DOB: 1955/05/13  MRN: [id number]  Primary Cardiologist: Gypsy Balsam, MD   Preoperative team, please contact this patient and set up a phone call appointment for further preoperative risk assessment. Please obtain consent and complete medication review. Thank you for your help.  I confirm that guidance regarding antiplatelet and oral anticoagulation therapy has been completed and, if necessary, noted below.  His Plavix may be held for 5 days prior to his procedure.  His aspirin should be continued throughout the perioperative procedure.  Please resume Plavix as soon as hemostasis is achieved.   Ronney Asters, NP 09/23/2022, 8:17 AM [facility]

## 2022-09-24 DIAGNOSIS — I2510 Atherosclerotic heart disease of native coronary artery without angina pectoris: Secondary | ICD-10-CM | POA: Diagnosis not present

## 2022-09-24 DIAGNOSIS — E782 Mixed hyperlipidemia: Secondary | ICD-10-CM | POA: Diagnosis not present

## 2022-09-26 DIAGNOSIS — E1065 Type 1 diabetes mellitus with hyperglycemia: Secondary | ICD-10-CM | POA: Diagnosis not present

## 2022-09-26 DIAGNOSIS — Z794 Long term (current) use of insulin: Secondary | ICD-10-CM | POA: Diagnosis not present

## 2022-09-26 DIAGNOSIS — Z8639 Personal history of other endocrine, nutritional and metabolic disease: Secondary | ICD-10-CM | POA: Diagnosis not present

## 2022-10-10 ENCOUNTER — Ambulatory Visit: Payer: Medicare Other | Attending: Cardiology | Admitting: Cardiology

## 2022-10-14 DIAGNOSIS — N1832 Chronic kidney disease, stage 3b: Secondary | ICD-10-CM | POA: Diagnosis not present

## 2022-10-14 DIAGNOSIS — I2510 Atherosclerotic heart disease of native coronary artery without angina pectoris: Secondary | ICD-10-CM | POA: Diagnosis not present

## 2022-10-14 DIAGNOSIS — E1022 Type 1 diabetes mellitus with diabetic chronic kidney disease: Secondary | ICD-10-CM | POA: Diagnosis not present

## 2022-10-14 DIAGNOSIS — I951 Orthostatic hypotension: Secondary | ICD-10-CM | POA: Diagnosis not present

## 2022-10-15 ENCOUNTER — Telehealth: Payer: Self-pay | Admitting: Cardiology

## 2022-10-15 NOTE — Telephone Encounter (Signed)
Pt has been scheduled for appt 10/17/22 @ 1:55 with Wallis Bamberg, NP. I left a message for Inetta Fermo with Dr. Camila Li office pt has in office appt 10/17/22. Once pt has been cleared we will fax notes.

## 2022-10-15 NOTE — Telephone Encounter (Signed)
Jonathan Holder with the requesting office called in to follow up on clearance. Advised her patient no showed 09/05 appt, so a message is being sent to the preop team contact the patient for a tele-visit to be arranged to clear patient.   Please advise.

## 2022-10-17 ENCOUNTER — Encounter: Payer: Self-pay | Admitting: Cardiology

## 2022-10-17 ENCOUNTER — Ambulatory Visit: Payer: Medicare Other | Attending: Cardiology | Admitting: Cardiology

## 2022-10-17 VITALS — BP 118/80 | HR 86 | Ht 63.0 in | Wt 156.6 lb

## 2022-10-17 DIAGNOSIS — I10 Essential (primary) hypertension: Secondary | ICD-10-CM

## 2022-10-17 DIAGNOSIS — E875 Hyperkalemia: Secondary | ICD-10-CM

## 2022-10-17 DIAGNOSIS — E782 Mixed hyperlipidemia: Secondary | ICD-10-CM | POA: Diagnosis not present

## 2022-10-17 DIAGNOSIS — Z951 Presence of aortocoronary bypass graft: Secondary | ICD-10-CM

## 2022-10-17 DIAGNOSIS — I2510 Atherosclerotic heart disease of native coronary artery without angina pectoris: Secondary | ICD-10-CM

## 2022-10-17 DIAGNOSIS — N1831 Chronic kidney disease, stage 3a: Secondary | ICD-10-CM | POA: Diagnosis not present

## 2022-10-17 DIAGNOSIS — Z01810 Encounter for preprocedural cardiovascular examination: Secondary | ICD-10-CM

## 2022-10-17 NOTE — Progress Notes (Signed)
Cardiology Office Note:  .   Date:  10/17/2022  ID:  Jonathan Holder, DOB 1955-03-19, MRN [id number] PCP: Noni Saupe, MD  [facility] Providers Cardiologist:  Gypsy Balsam, MD    History of Present Illness: .   Jonathan Holder is a 67 y.o. male with a past medical history of hypertension, CAD s/p CABG x 5 in 2021, ischemic cardiomyopathy, CKD stage III A, HLD.   02/22/2022 echo 45 to 50%, grade 2 DD, mild MR, aortic valve sclerosis is present without stenosis 08/24/2019 CABG x 5 08/20/2019 left heart cath severe multivessel CAD  He presents today for follow-up of his CAD and also for preoperative evaluation for an upcoming colonoscopy.  He states he has been doing okay from a cardiac perspective, he has noticed that his blood pressure continues to run low and he has associated fatigue with this.  He was recently evaluated by nephrology for management of his CKD, potassium was elevated at 5.7 and he was given medication to bring his potassium back down. He denies chest pain, palpitations, dyspnea, pnd, orthopnea, n, v, dizziness, syncope, edema, weight gain, or early satiety.   ROS: Review of Systems  Constitutional: Negative.   HENT: Negative.    Eyes: Negative.   Respiratory: Negative.    Cardiovascular: Negative.   Gastrointestinal: Negative.   Musculoskeletal: Negative.   Skin: Negative.   Neurological:  Positive for dizziness.  Endo/Heme/Allergies: Negative.   Psychiatric/Behavioral: Negative.       Studies Reviewed: Marland Kitchen   EKG Interpretation Date/Time:  Thursday October 17 2022 13:58:02 EDT Ventricular Rate:  86 PR Interval:  148 QRS Duration:  72 QT Interval:  332 QTC Calculation: 397 R Axis:   -4  Text Interpretation: Normal sinus rhythm Possible Left atrial enlargement Anterolateral infarct (cited on or before 24-Aug-2019)-- no changes Abnormal ECG When compared with ECG of 25-Aug-2019 06:36, Left anterior fascicular block is no longer Present Minimal  criteria for Inferior infarct are no longer Present Questionable change in initial forces of Lateral leads Nonspecific T wave abnormality, improved in Anterolateral leads Confirmed by Wallis Bamberg [phone]) on 10/17/2022 8:28:08 PM    Cardiac Studies & Procedures   CARDIAC CATHETERIZATION  CARDIAC CATHETERIZATION 08/20/2019  Narrative  Mid LAD lesion is 95% stenosed.  Prox Cx lesion is 85% stenosed.  Ramus-1 lesion is 70% stenosed.  Ramus-2 lesion is 80% stenosed.  Prox Cx to Mid Cx lesion is 70% stenosed.  Ramus-3 lesion is 90% stenosed.  Prox RCA-1 lesion is 30% stenosed.  Prox RCA-2 lesion is 30% stenosed.  Mid RCA lesion is 70% stenosed.  RPDA lesion is 70% stenosed.  The left ventricular ejection fraction is 25-35% by visual estimate.  LV end diastolic pressure is mildly elevated.  There is moderate to severe left ventricular systolic dysfunction.  There is no aortic valve stenosis.  Findings Coronary Findings Diagnostic  Dominance: Right  Left Anterior Descending There is mild diffuse disease throughout the vessel. Mid LAD lesion is 95% stenosed. Vessel is the culprit lesion. The lesion is located proximal to the major branch, focal, discrete and concentric.  First Diagonal Branch Vessel is small in size. There is moderate disease in the vessel.  First Septal Branch Major Septal trunk - several branches  Second Septal Branch Vessel is small in size.  Third Diagonal Branch Vessel is small in size.  Ramus Intermedius Vessel is large. There is moderate diffuse disease throughout the vessel. Ramus-1 lesion is 70% stenosed. The lesion is segmental, eccentric  and irregular. Ramus-2 lesion is 80% stenosed. The lesion is segmental, eccentric and irregular. Ramus-3 lesion is 90% stenosed. The lesion is segmental and eccentric.  Left Circumflex Vessel is small. There is mild diffuse disease throughout the vessel. Prox Cx lesion is 85% stenosed. The lesion  is located at the bend, focal and discrete. Prox Cx to Mid Cx lesion is 70% stenosed. The lesion is segmental and concentric.  First Obtuse Marginal Branch Vessel is small in size.  Left Posterior Atrioventricular Artery Vessel is small in size.  Right Coronary Artery There is mild diffuse disease throughout the vessel. Prox RCA-1 lesion is 30% stenosed. Prox RCA-2 lesion is 30% stenosed. Mid RCA lesion is 70% stenosed. The lesion is focal and concentric.  Acute Marginal Branch Vessel is small in size.  Right Ventricular Branch Vessel is small in size.  Right Posterior Descending Artery Vessel is small in size. RPDA lesion is 70% stenosed. The lesion is focal and concentric.  First Right Posterolateral Branch Vessel is small in size.  Intervention  No interventions have been documented.     ECHOCARDIOGRAM  ECHOCARDIOGRAM COMPLETE 02/21/2022  Narrative ECHOCARDIOGRAM REPORT    Patient Name:   Jonathan Holder Date of Exam: 02/21/2022 Medical Rec #:  [id number]    Height:       65.0 in Accession #:    [id number]   Weight:       161.4 lb Date of Birth:  [birth year]     BSA:          1.806 m Patient Age:    67 years     BP:           110/72 mmHg Patient Gender: M            HR:           73 bpm. Exam Location:  [facility]  Procedure: 2D Echo, Cardiac Doppler, Color Doppler, Strain Analysis and Intracardiac Opacification Agent  Indications:    Dyspnea on exertion [R06.09 (ICD-10-CM)]  History:        Patient has prior history of Echocardiogram examinations, most recent 04/03/2021. CAD and Previous Myocardial Infarction, 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; Risk Factors:Hypertension, Diabetes and Dyslipidemia.  Sonographer:    Louie Boston RDCS Referring Phys: [phone] Marveen Reeks KRASOWSKI  IMPRESSIONS   1. Left ventricular ejection fraction, by estimation, is 45 to 50%. The left ventricle has mildly decreased function. The left ventricle demonstrates regional wall motion  abnormalities (see scoring diagram/findings for description). Left ventricular diastolic parameters are consistent with Grade II diastolic dysfunction (pseudonormalization). There is severe akinesis of the left ventricular, apical anterior wall. 2. Right ventricular systolic function is normal. The right ventricular size is normal. There is normal pulmonary artery systolic pressure. 3. The mitral valve is normal in structure. Mild mitral valve regurgitation. No evidence of mitral stenosis. 4. The aortic valve is normal in structure. Aortic valve regurgitation is not visualized. Aortic valve sclerosis/calcification is present, without any evidence of aortic stenosis. 5. The inferior vena cava is normal in size with greater than 50% respiratory variability, suggesting right atrial pressure of 3 mmHg.  FINDINGS Left Ventricle: Left ventricular ejection fraction, by estimation, is 45 to 50%. The left ventricle has mildly decreased function. The left ventricle demonstrates regional wall motion abnormalities. Severe akinesis of the left ventricular, apical anterior wall. Definity contrast agent was given IV to delineate the left ventricular endocardial borders. The left ventricular internal cavity size was normal in size. There is  no left ventricular hypertrophy. Left ventricular diastolic parameters are consistent with Grade II diastolic dysfunction (pseudonormalization).   LV Wall Scoring: The apex is akinetic.  Right Ventricle: The right ventricular size is normal. No increase in right ventricular wall thickness. Right ventricular systolic function is normal. There is normal pulmonary artery systolic pressure. The tricuspid regurgitant velocity is 2.17 m/s, and with an assumed right atrial pressure of 3 mmHg, the estimated right ventricular systolic pressure is 21.8 mmHg.  Left Atrium: Left atrial size was normal in size.  Right Atrium: Right atrial size was normal in size.  Pericardium: There is  no evidence of pericardial effusion.  Mitral Valve: The mitral valve is normal in structure. Mild mitral valve regurgitation. No evidence of mitral valve stenosis.  Tricuspid Valve: The tricuspid valve is normal in structure. Tricuspid valve regurgitation is mild . No evidence of tricuspid stenosis.  Aortic Valve: The aortic valve is normal in structure. Aortic valve regurgitation is not visualized. Aortic valve sclerosis/calcification is present, without any evidence of aortic stenosis.  Pulmonic Valve: The pulmonic valve was normal in structure. Pulmonic valve regurgitation is not visualized. No evidence of pulmonic stenosis.  Aorta: The aortic root is normal in size and structure.  Venous: The inferior vena cava is normal in size with greater than 50% respiratory variability, suggesting right atrial pressure of 3 mmHg.  IAS/Shunts: No atrial level shunt detected by color flow Doppler.   LEFT VENTRICLE PLAX 2D LVIDd:         5.10 cm     Diastology LVIDs:         4.00 cm     LV e' medial:    3.14 cm/s LV PW:         0.90 cm     LV E/e' medial:  32.2 LV IVS:        0.90 cm     LV e' lateral:   7.27 cm/s LVOT diam:     1.90 cm     LV E/e' lateral: 13.9 LV SV:         64 LV SV Index:   35 LVOT Area:     2.84 cm  LV Volumes (MOD) LV vol d, MOD A2C: 68.9 ml LV vol d, MOD A4C: 71.8 ml LV vol s, MOD A2C: 33.7 ml LV vol s, MOD A4C: 39.4 ml LV SV MOD A2C:     35.2 ml LV SV MOD A4C:     71.8 ml LV SV MOD BP:      36.6 ml  RIGHT VENTRICLE            IVC RV S prime:     6.60 cm/s  IVC diam: 1.50 cm TAPSE (M-mode): 1.0 cm  LEFT ATRIUM             Index        RIGHT ATRIUM           Index LA diam:        3.30 cm 1.83 cm/m   RA Area:     12.10 cm LA Vol (A2C):   50.7 ml 28.08 ml/m  RA Volume:   24.50 ml  13.57 ml/m LA Vol (A4C):   38.0 ml 21.04 ml/m LA Biplane Vol: 45.2 ml 25.03 ml/m AORTIC VALVE LVOT Vmax:   100.00 cm/s LVOT Vmean:  71.600 cm/s LVOT VTI:    0.225  m  AORTA Ao Root diam: 3.10 cm Ao Asc diam:  3.20 cm Ao Desc diam:  2.10 cm  MITRAL VALVE                TRICUSPID VALVE MV Area (PHT): 5.42 cm     TR Peak grad:   18.8 mmHg MV Decel Time: 140 msec     TR Vmax:        217.00 cm/s MV E velocity: 101.00 cm/s MV A velocity: 84.80 cm/s   SHUNTS MV E/A ratio:  1.19         Systemic VTI:  0.22 m Systemic Diam: 1.90 cm  Gypsy Balsam MD Electronically signed by Gypsy Balsam MD Signature Date/Time: 02/22/2022/10:13:07 AM    Final   TEE  ECHO INTRAOPERATIVE TEE 08/24/2019  Narrative *INTRAOPERATIVE TRANSESOPHAGEAL REPORT *    Patient Name:   CHAYTON ACERO   Date of Exam: 08/24/2019 Medical Rec #:  [id number]      Height:       64.0 in Accession #:    [id number]     Weight:       136.9 lb Date of Birth:  18-Sep-1955       BSA:          1.67 m Patient Age:    64 years       BP:           119/50 mmHg Patient Gender: M              HR:           52 bpm. Exam Location:  Anesthesiology  Transesophogeal exam was perform intraoperatively during surgical procedure. Patient was closely monitored under general anesthesia during the entirety of examination.  Indications:     CABG. CAD Sonographer:     Celene Skeen RDCS (AE) Performing Phys: [id number] BROADUS Z ATKINS  Complications: No known complications during this procedure. POST-OP IMPRESSIONS Overall, there were no significant changes from pre-bypass.  PRE-OP FINDINGS Left Ventricle: The left ventricle has mild-moderately reduced systolic function, with an ejection fraction of 40-45%. The cavity size was moderately dilated. The mid papillary region towards the base of the heart has fairly normal function. There is akinesis of the apex of the heart. There is no increase in left ventricular wall thickness.  Right Ventricle: The right ventricle has normal systolic function. The cavity was normal. There is no increase in right ventricular wall thickness.  Left Atrium: Left atrial  size was not assessed. The left atrial appendage is well visualized and there is no evidence of thrombus present.  Right Atrium: Right atrial size is dilated.  Interatrial Septum: No atrial level shunt detected by color flow Doppler.  Pericardium: A small pericardial effusion is present. The pericardial effusion is circumferential. There is no evidence of cardiac tamponade.  Mitral Valve: The mitral valve is normal in structure. No thickening of the mitral valve leaflet. No calcification of the mitral valve leaflet. Mitral valve regurgitation is trivial by color flow Doppler.  Tricuspid Valve: The tricuspid valve was normal in structure. Tricuspid valve regurgitation is trivial by color flow Doppler.  Aortic Valve: The aortic valve is normal in structure. Aortic valve regurgitation was not visualized by color flow Doppler. There is no evidence of aortic valve stenosis.  Pulmonic Valve: The pulmonic valve was normal in structure. Pulmonic valve regurgitation is not visualized by color flow Doppler.   Compared to previous exam:The pericardial effusion is no longer present. +--------------+--------++ LEFT VENTRICLE         +--------------+--------++ PLAX 2D                +--------------+--------++  LVIDd:        4.93 cm  +--------------+--------++ LVIDs:        3.53 cm  +--------------+--------++ LVOT diam:    2.05 cm  +--------------+--------++ LV SV:        63 ml    +--------------+--------++ LV SV Index:  37.16    +--------------+--------++ LVOT Area:    3.30 cm +--------------+--------++                        +--------------+--------++  +------------------+---------++ LV Volumes (MOD)            +------------------+---------++ LV area d, A4C:   21.80 cm +------------------+---------++ LV area s, A4C:   14.90 cm +------------------+---------++ LV major d, A4C:  6.75 cm   +------------------+---------++ LV major  s, A4C:  6.31 cm   +------------------+---------++ LV vol d, MOD A4C:55.1 ml   +------------------+---------++ LV vol s, MOD A4C:28.8 ml   +------------------+---------++ LV SV MOD A4C:    55.1 ml   +------------------+---------++  +------------------+-----------++ AORTIC VALVE                  +------------------+-----------++ AV Area (Vmax):   2.02 cm    +------------------+-----------++ AV Area (Vmean):  2.05 cm    +------------------+-----------++ AV Area (VTI):    2.37 cm    +------------------+-----------++ AV Vmax:          138.00 cm/s +------------------+-----------++ AV Vmean:         93.000 cm/s +------------------+-----------++ AV VTI:           0.273 m     +------------------+-----------++ AV Peak Grad:     7.6 mmHg    +------------------+-----------++ AV Mean Grad:     4.0 mmHg    +------------------+-----------++ LVOT Vmax:        84.40 cm/s  +------------------+-----------++ LVOT Vmean:       57.700 cm/s +------------------+-----------++ LVOT VTI:         0.196 m     +------------------+-----------++ LVOT/AV VTI ratio:0.72        +------------------+-----------++   +--------------+-------+ SHUNTS                +--------------+-------+ Systemic VTI: 0.20 m  +--------------+-------+ Systemic Diam:2.05 cm +--------------+-------+   Gaynelle Adu MD Electronically signed by Gaynelle Adu MD Signature Date/Time: 08/24/2019/2:41:49 PM    Final            Risk Assessment/Calculations:             Physical Exam:   VS:  BP 118/80 (BP Location: Left Arm, Patient Position: Sitting, Cuff Size: Normal)   Pulse 86   Ht 5\' 3"  (1.6 m)   Wt 156 lb 9.6 oz (71 kg)   SpO2 97%   BMI 27.74 kg/m    Wt Readings from Last 3 Encounters:  10/17/22 156 lb 9.6 oz (71 kg)  07/04/22 155 lb (70.3 kg)  02/12/22 161 lb 6.4 oz (73.2 kg)    GEN: Well nourished, well developed  in no acute distress NECK: No JVD; No carotid bruits CARDIAC: RRR, no murmurs, rubs, gallops RESPIRATORY:  Clear to auscultation without rales, wheezing or rhonchi  ABDOMEN: Soft, non-tender, non-distended EXTREMITIES:  No edema; No deformity   ASSESSMENT AND PLAN: .   Coronary artery disease-s/p CABG times 06/24/2019, Stable with no anginal symptoms. No indication for ischemic evaluation.  Continue aspirin 81 mg daily, continue Plavix 75 mg daily, continue Lipitor 40 mg daily. Hypotension- blood pressure today is 118/80 however he  does endorse times when it is lower at home, continue midodrine 10 mg 3 times daily.  Hyperkalemia-potassium was 5.7 earlier this week, being managed by his nephrologist--he was given " a packet to take daily for 11 days", he has an upcoming colonoscopy so we will repeat his BMET on Monday to ensure that his potassium is coming down. CKD-being managed per nephrology per above. Careful titration of diuretic and antihypertensive.   Preoperative cardiovascular evaluation-has an upcoming colonoscopy, according to the Revised Cardiac Risk Index (RCRI), his Perioperative Risk of Major Cardiac Event is (%): 6.6 His Functional Capacity in METs is: 6.61 according to the [employer] Activity Status Index (DASI).  Therefore, based on ACC/AHA guidelines, patient would be at acceptable risk for the planned procedure without further cardiovascular testing. I will route this recommendation to the requesting party via Epic fax function.  Regarding his Plavix, he can hold this for 5 days prior to the colonoscopy and resume once cleared to by GI MD.  Regarding his aspirin, we would prefer this continues throughout the perioperative process however can be held 7 days prior to if needed.       Dispo: Repeat BMET, return in 6 weeks.  Signed, Flossie Dibble, NP

## 2022-10-17 NOTE — Patient Instructions (Signed)
Medication Instructions:  Your physician recommends that you continue on your current medications as directed. Please refer to the Current Medication list given to you today.  *If you need a refill on your cardiac medications before your next appointment, please call your pharmacy*   Lab Work: Your physician recommends that you return for lab work in: Monday Sept. 16th  Lab opens at 8am. You DO NOT NEED an appointment. Best time to come is between 8am and 12noon and between 1:30 and 4:30. If you have been asked to fast for your blood work please have nothing to eat or drink after midnight. You may have water.   If you have labs (blood work) drawn today and your tests are completely normal, you will receive your results only by: MyChart Message (if you have MyChart) OR A paper copy in the mail If you have any lab test that is abnormal or we need to change your treatment, we will call you to review the results.   Testing/Procedures: NONE   Follow-Up: At [facility], you and your health needs are our priority.  As part of our continuing mission to provide you with exceptional heart care, we have created designated Provider Care Teams.  These Care Teams include your primary Cardiologist (physician) and Advanced Practice Providers (APPs -  Physician Assistants and Nurse Practitioners) who all work together to provide you with the care you need, when you need it.  We recommend signing up for the patient portal called "MyChart".  Sign up information is provided on this After Visit Summary.  MyChart is used to connect with patients for Virtual Visits (Telemedicine).  Patients are able to view lab/test results, encounter notes, upcoming appointments, etc.  Non-urgent messages can be sent to your provider as well.   To learn more about what you can do with MyChart, go to [email or web address].    Your next appointment:   6-8 week(s)  Provider:   Gypsy Balsam, MD    Other  Instructions

## 2022-10-18 DIAGNOSIS — N189 Chronic kidney disease, unspecified: Secondary | ICD-10-CM | POA: Diagnosis not present

## 2022-10-18 DIAGNOSIS — E1022 Type 1 diabetes mellitus with diabetic chronic kidney disease: Secondary | ICD-10-CM | POA: Diagnosis not present

## 2022-10-18 DIAGNOSIS — I2510 Atherosclerotic heart disease of native coronary artery without angina pectoris: Secondary | ICD-10-CM | POA: Diagnosis not present

## 2022-10-18 DIAGNOSIS — N1832 Chronic kidney disease, stage 3b: Secondary | ICD-10-CM | POA: Diagnosis not present

## 2022-10-22 ENCOUNTER — Other Ambulatory Visit (HOSPITAL_COMMUNITY): Payer: Self-pay | Admitting: Nephrology

## 2022-10-22 DIAGNOSIS — Z951 Presence of aortocoronary bypass graft: Secondary | ICD-10-CM | POA: Diagnosis not present

## 2022-10-22 DIAGNOSIS — E782 Mixed hyperlipidemia: Secondary | ICD-10-CM | POA: Diagnosis not present

## 2022-10-22 DIAGNOSIS — D351 Benign neoplasm of parathyroid gland: Secondary | ICD-10-CM

## 2022-10-22 DIAGNOSIS — N1831 Chronic kidney disease, stage 3a: Secondary | ICD-10-CM | POA: Diagnosis not present

## 2022-10-22 DIAGNOSIS — I2510 Atherosclerotic heart disease of native coronary artery without angina pectoris: Secondary | ICD-10-CM | POA: Diagnosis not present

## 2022-10-23 ENCOUNTER — Telehealth: Payer: Self-pay

## 2022-10-23 LAB — BASIC METABOLIC PANEL WITH GFR
BUN/Creatinine Ratio: 13 (ref 10–24)
BUN: 23 mg/dL (ref 8–27)
CO2: 28 mmol/L (ref 20–29)
Calcium: 10 mg/dL (ref 8.6–10.2)
Chloride: 103 mmol/L (ref 96–106)
Creatinine, Ser: 1.81 mg/dL — ABNORMAL HIGH (ref 0.76–1.27)
Glucose: 164 mg/dL — ABNORMAL HIGH (ref 70–99)
Potassium: 4.8 mmol/L (ref 3.5–5.2)
Sodium: 142 mmol/L (ref 134–144)
eGFR: 41 mL/min/1.73 — ABNORMAL LOW

## 2022-10-23 NOTE — Telephone Encounter (Signed)
Unable to reach or LM ?

## 2022-10-23 NOTE — Telephone Encounter (Signed)
Patient notified of results and verbalized understanding.

## 2022-10-23 NOTE — Telephone Encounter (Signed)
-----   Message from Flossie Dibble sent at 10/23/2022 11:22 AM EDT ----- Kidney dysfunction is essentially the same, potassium is better and back in normal range.

## 2022-10-23 NOTE — Telephone Encounter (Signed)
Error

## 2022-10-24 DIAGNOSIS — K644 Residual hemorrhoidal skin tags: Secondary | ICD-10-CM | POA: Diagnosis not present

## 2022-10-24 DIAGNOSIS — Z1211 Encounter for screening for malignant neoplasm of colon: Secondary | ICD-10-CM | POA: Diagnosis not present

## 2022-10-24 DIAGNOSIS — E119 Type 2 diabetes mellitus without complications: Secondary | ICD-10-CM | POA: Diagnosis not present

## 2022-10-25 DIAGNOSIS — Z8639 Personal history of other endocrine, nutritional and metabolic disease: Secondary | ICD-10-CM | POA: Diagnosis not present

## 2022-10-25 DIAGNOSIS — Z794 Long term (current) use of insulin: Secondary | ICD-10-CM | POA: Diagnosis not present

## 2022-10-25 DIAGNOSIS — E1065 Type 1 diabetes mellitus with hyperglycemia: Secondary | ICD-10-CM | POA: Diagnosis not present

## 2022-10-31 DIAGNOSIS — E875 Hyperkalemia: Secondary | ICD-10-CM | POA: Diagnosis not present

## 2022-10-31 DIAGNOSIS — E1065 Type 1 diabetes mellitus with hyperglycemia: Secondary | ICD-10-CM | POA: Diagnosis not present

## 2022-11-07 ENCOUNTER — Encounter (HOSPITAL_COMMUNITY)
Admission: RE | Admit: 2022-11-07 | Discharge: 2022-11-07 | Disposition: A | Discharge status: Home / Self Care | Payer: Medicare Other | Source: Ambulatory Visit | Attending: Nephrology | Admitting: Nephrology

## 2022-11-07 ENCOUNTER — Ambulatory Visit (HOSPITAL_COMMUNITY)
Admission: RE | Admit: 2022-11-07 | Discharge: 2022-11-07 | Disposition: A | Discharge status: Home / Self Care | Payer: Medicare Other | Source: Ambulatory Visit | Attending: Nephrology | Admitting: Nephrology

## 2022-11-07 DIAGNOSIS — D351 Benign neoplasm of parathyroid gland: Secondary | ICD-10-CM | POA: Insufficient documentation

## 2022-11-07 MED ORDER — TECHNETIUM TC 99M SESTAMIBI - CARDIOLITE
25.0000 | Freq: Once | INTRAVENOUS | Status: AC | PRN
  Administered 2022-11-07: 25 via INTRAVENOUS

## 2022-11-11 DIAGNOSIS — H40013 Open angle with borderline findings, low risk, bilateral: Secondary | ICD-10-CM | POA: Diagnosis not present

## 2022-11-24 DIAGNOSIS — Z794 Long term (current) use of insulin: Secondary | ICD-10-CM | POA: Diagnosis not present

## 2022-11-24 DIAGNOSIS — E1065 Type 1 diabetes mellitus with hyperglycemia: Secondary | ICD-10-CM | POA: Diagnosis not present

## 2022-11-27 ENCOUNTER — Encounter: Payer: Self-pay | Admitting: Cardiology

## 2022-11-28 ENCOUNTER — Ambulatory Visit: Payer: Medicare Other | Attending: Cardiology | Admitting: Cardiology

## 2022-11-28 ENCOUNTER — Encounter: Payer: Self-pay | Admitting: Cardiology

## 2022-11-28 VITALS — BP 108/72 | HR 84 | Ht 64.0 in | Wt 161.6 lb

## 2022-11-28 DIAGNOSIS — E785 Hyperlipidemia, unspecified: Secondary | ICD-10-CM

## 2022-11-28 DIAGNOSIS — I10 Essential (primary) hypertension: Secondary | ICD-10-CM | POA: Diagnosis not present

## 2022-11-28 DIAGNOSIS — R0609 Other forms of dyspnea: Secondary | ICD-10-CM

## 2022-11-28 DIAGNOSIS — Z794 Long term (current) use of insulin: Secondary | ICD-10-CM

## 2022-11-28 DIAGNOSIS — E118 Type 2 diabetes mellitus with unspecified complications: Secondary | ICD-10-CM | POA: Diagnosis not present

## 2022-11-28 DIAGNOSIS — Z951 Presence of aortocoronary bypass graft: Secondary | ICD-10-CM

## 2022-11-28 DIAGNOSIS — I255 Ischemic cardiomyopathy: Secondary | ICD-10-CM

## 2022-11-28 DIAGNOSIS — E1169 Type 2 diabetes mellitus with other specified complication: Secondary | ICD-10-CM

## 2022-11-28 MED ORDER — ATORVASTATIN CALCIUM 80 MG PO TABS
80.0000 mg | ORAL_TABLET | Freq: Every day | ORAL | 3 refills | Status: DC
Start: 2022-11-28 — End: 2023-12-30

## 2022-11-28 NOTE — Addendum Note (Signed)
Addended by: Baldo Ash D on: 11/28/2022 10:37 AM   Modules accepted: Orders

## 2022-11-28 NOTE — Patient Instructions (Addendum)
Medication Instructions:   INCREASE: Lipitor to 80mg  daily-You may double your current dose and your next refill will reflect your new dose.   Lab Work: Your physician recommends that you return for lab work in: 6 weeks You need to have labs done when you are fasting.  You can come Monday through Friday 8:30 am to 12:00 pm and 1:15 to 4:30. You do not need to make an appointment as the order has already been placed. The labs you are going to have done are AST, ALT  Lipids.    Testing/Procedures: Your physician has requested that you have an echocardiogram. Echocardiography is a painless test that uses sound waves to create images of your heart. It provides your doctor with information about the size and shape of your heart and how well your heart's chambers and valves are working. This procedure takes approximately one hour. There are no restrictions for this procedure. Please do NOT wear cologne, perfume, aftershave, or lotions (deodorant is allowed). Please arrive 15 minutes prior to your appointment time.    Follow-Up: At [facility], you and your health needs are our priority.  As part of our continuing mission to provide you with exceptional heart care, we have created designated Provider Care Teams.  These Care Teams include your primary Cardiologist (physician) and Advanced Practice Providers (APPs -  Physician Assistants and Nurse Practitioners) who all work together to provide you with the care you need, when you need it.  We recommend signing up for the patient portal called "MyChart".  Sign up information is provided on this After Visit Summary.  MyChart is used to connect with patients for Virtual Visits (Telemedicine).  Patients are able to view lab/test results, encounter notes, upcoming appointments, etc.  Non-urgent messages can be sent to your provider as well.   To learn more about what you can do with MyChart, go to [email or web address].    Your next appointment:   6  month(s)  The format for your next appointment:   In Person  Provider:   Gypsy Balsam, MD    Other Instructions NA

## 2022-11-28 NOTE — Progress Notes (Signed)
Cardiology Office Note:    Date:  11/28/2022   ID:  Jonathan Holder, DOB [birth year], MRN [id number]  PCP:  Noni Saupe, MD  Cardiologist:  Gypsy Balsam, MD    Referring MD: Noni Saupe, MD   Chief Complaint  Patient presents with   low BP    History of Present Illness:    Jonathan Holder is a 67 y.o. male  e with past medical history significant for coronary artery disease in 2021 he end up coming to the hospital episode of chest pain he was transferred to [facility], cardiac catheterization has been performed which was fine 90% mid LAD 90% intermediate branch, 80% stenosis of the circumflex as well as 70 external percent stenosis of the right coronary artery he was also found to have ischemic cardiomyopathy with ejection fraction 25 to 30%.  He end up having coronary bypass graft done 5 grafts after that ejection fraction improved.  Additional problem include diabetes which appears to be difficult to control, depression, dyslipidemia, orthostatic hypotension.  Comes today to months for follow-up overall seems to be doing well he described to have episode of weakness fatigue and dizziness.  I suspect the problem is blood pressure being low and we have been struggling this for a while I did discover today that he takes midodrine the wrong way he takes 1 tablet in the morning and 2 tablets before going to bed I told him not to do that I instructed him to take midodrine 1 tablet in the morning second 1 in the afternoon and 31 4 hours before he goes to bed.  Denies have any chest pain tightness squeezing pressure burning chest and overall majority of time he is doing well  Past Medical History:  Diagnosis Date   Anxiety    Arteriosclerosis of coronary artery bypass graft 09/18/2020   Arthritis    Carotid artery disease (HCC) 2021   Mild, bilateral   Chronic kidney disease, stage 3a (HCC) 02/12/2022   Depression    Diabetes mellitus without complication (HCC)    Diabetic  polyneuropathy (HCC) 09/18/2020   Essential hypertension 08/20/2019   History of endocrine disorder 09/18/2020   History of kidney stones    HNP (herniated nucleus pulposus) with myelopathy, 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/31/2016   Hyperglycemia due to type 1 diabetes mellitus (HCC) 09/18/2020   Hyperlipidemia associated with type 2 diabetes mellitus (HCC) 08/20/2019   Hyperlipidemia, unspecified 09/18/2020   Hypertension    Ischemic cardiomyopathy 09/19/2020   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 from [facility] 08/20/2019   Long term (current) use of insulin (HCC) 09/18/2020   Multi-vessel coronary artery stenosis 08/20/2019   Orthostatic hypotension 05/21/2021   Presence of insulin pump (external) (internal) 09/18/2020   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/24/2019   Subsequent ST elevation (STEMI) myocardial infarction of anterior wall within 4 weeks of initial infarction (HCC) 08/20/2019   Type 2 diabetes mellitus with complication, with long-term current use of insulin (HCC) 08/20/2019    Past Surgical History:  Procedure Laterality Date   ANTERIOR CERVICAL DECOMP/DISCECTOMY FUSION N/A 07/31/2016   Procedure: ANTERIOR CERVICAL DECOMPRESSION/DISCECTOMY FUSION CERVICAL FOUR- CERVICAL FIVE, CERVICAL FIVE- CERVICAL SIX, CERVICAL SIX, CERVICAL SEVEN;  Surgeon: Shirlean Kelly, MD;  Location: [facility] OR;  Service: Neurosurgery;  Laterality: N/A;   COLONOSCOPY  04/13/2019   Dr. Jennye Boroughs, [facility]. Normal Colonoscopy to the terminal ileum but with a poor prep.   CORONARY ARTERY BYPASS GRAFT N/A 08/24/2019   Procedure: CORONARY ARTERY  BYPASS GRAFTING (CABG), ON PUMP, TIMES FIVE, USING BILATERAL INTERNAL MAMMARY ARTERIES, ENDOSCOPICALLY HARVESTED RIGHT GREATER SAPHENOUS VEIN, AND OPEN LEFT RADIAL ARTERY HARVESTING;  Surgeon: Linden Dolin, MD;  Location: [facility] OR;  Service: Open Heart Surgery;  Laterality: N/A;   DG THUMB RIGHT HAND ([facility] HX) Left    plate   ESOPHAGOGASTRODUODENOSCOPY   04/13/2019   Dr. Jennye Boroughs, [facility]. Mild Scattered gastritis. Small amount of food in the fundus   EYE SURGERY Bilateral    cataract removed   LEFT HEART CATH AND CORONARY ANGIOGRAPHY N/A 08/20/2019   Procedure: LEFT HEART CATH AND CORONARY ANGIOGRAPHY;  Surgeon: Marykay Lex, MD;  Location: [facility] INVASIVE CV LAB;  Service: Cardiovascular;  Laterality: N/A;   PENILE PROSTHESIS IMPLANT     RADIAL ARTERY HARVEST Left 08/24/2019   Procedure: RADIAL ARTERY HARVEST;  Surgeon: Linden Dolin, MD;  Location: [facility] OR;  Service: Open Heart Surgery;  Laterality: Left;   TEE WITHOUT CARDIOVERSION N/A 08/24/2019   Procedure: TRANSESOPHAGEAL ECHOCARDIOGRAM (TEE);  Surgeon: Linden Dolin, MD;  Location: [facility] OR;  Service: Open Heart Surgery;  Laterality: N/A;    Current Medications: Current Meds  Medication Sig   aspirin EC 81 MG EC tablet Take 1 tablet (81 mg total) by mouth daily. Swallow whole.   atorvastatin (LIPITOR) 40 MG tablet Take 40 mg by mouth daily.   clopidogrel (PLAVIX) 75 MG tablet Take 1 tablet (75 mg total) by mouth daily.   desvenlafaxine (PRISTIQ) 100 MG 24 hr tablet Take 100 mg by mouth daily.   famotidine (PEPCID) 20 MG tablet Take 20 mg by mouth daily.   gabapentin (NEURONTIN) 300 MG capsule Take 600 mg by mouth every morning. And 300 mg at night   HUMALOG 100 UNIT/ML injection Inject 1-25 Units into the skin as directed. According to sliding scale   midodrine (PROAMATINE) 10 MG tablet Take 1 tablet (10 mg total) by mouth 3 (three) times daily.   QUEtiapine (SEROQUEL) 200 MG tablet Take 200 mg by mouth at bedtime.     Allergies:   Patient has no known allergies.   Social History   Socioeconomic History   Marital status: Married    Spouse name: Not on file   Number of children: Not on file   Years of education: Not on file   Highest education level: Not on file  Occupational History   Occupation: Production designer, theatre/television/film    Comment: [employer]  Tobacco Use    Smoking status: Never   Smokeless tobacco: Never  Vaping Use   Vaping status: Never Used  Substance and Sexual Activity   Alcohol use: No   Drug use: No   Sexual activity: Yes  Other Topics Concern   Not on file  Social History Narrative   Works for [employer] business   Social Determinants of Health   Financial Resource Strain: Not on file  Food Insecurity: Not on file  Transportation Needs: Not on file  Physical Activity: Not on file  Stress: Not on file  Social Connections: Not on file     Family History: The patient's family history includes Dementia in his mother; Heart disease in his father. ROS:   Please see the history of present illness.    All 14 point review of systems negative except as described per history of present illness  EKGs/Labs/Other Studies Reviewed:         Recent Labs: 02/21/2022: ALT 30 07/04/2022: Hemoglobin 13.0; Platelets 214 10/22/2022: BUN 23; Creatinine,  Ser 1.81; Potassium 4.8; Sodium 142  Recent Lipid Panel    Component Value Date/Time   CHOL 124 02/21/2022 0845   TRIG 95 02/21/2022 0845   HDL 46 02/21/2022 0845   CHOLHDL 2.7 02/21/2022 0845   CHOLHDL 2.9 08/21/2019 0757   VLDL 8 08/21/2019 0757   LDLCALC 60 02/21/2022 0845   LDLDIRECT 61 07/04/2022 1522    Physical Exam:    VS:  BP 108/72 (BP Location: Left Arm, Patient Position: Sitting)   Pulse 84   Ht 5\' 4"  (1.626 m)   Wt 161 lb 9.6 oz (73.3 kg)   SpO2 98%   BMI 27.74 kg/m     Wt Readings from Last 3 Encounters:  11/28/22 161 lb 9.6 oz (73.3 kg)  10/17/22 156 lb 9.6 oz (71 kg)  07/04/22 155 lb (70.3 kg)     GEN:  Well nourished, well developed in no acute distress HEENT: Normal NECK: No JVD; No carotid bruits LYMPHATICS: No lymphadenopathy CARDIAC: RRR, no murmurs, no rubs, no gallops RESPIRATORY:  Clear to auscultation without rales, wheezing or rhonchi  ABDOMEN: Soft, non-tender, non-distended MUSCULOSKELETAL:  No edema; No deformity  SKIN: Warm and  dry LOWER EXTREMITIES: no swelling NEUROLOGIC:  Alert and oriented x 3 PSYCHIATRIC:  Normal affect   ASSESSMENT:    1. Essential hypertension   2. Ischemic cardiomyopathy   3. Type 2 diabetes mellitus with complication, with long-term current use of insulin (HCC)   4. 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  Stable from that point review we will continue dual antiplatelet therapy because of high risk.  Denies have any symptoms that would suggest reactivation of the problem. History of ischemic cardiomyopathy difficulty tolerating medication to improve the function secondary to hypotension.  Will schedule him to have echocardiogram done to reassess left ventricular ejection fraction. Diabetes mellitus followed by antimedicine team his hemoglobin A1c from summer of this year was 8.1 which is high now he gets CGM as well as an insulin pump which helps significantly. Dyslipidemia I did review K PN which show me his LDL of 86 a HDL 65.  He is on Lipitor 40 will increase Lipitor to 80.  Fasting lipid profile will be done   Medication Adjustments/Labs and Tests Ordered: Current medicines are reviewed at length with the patient today.  Concerns regarding medicines are outlined above.  No orders of the defined types were placed in this encounter.  Medication changes: No orders of the defined types were placed in this encounter.   Signed, Georgeanna Lea, MD, [facility] 11/28/2022 10:24 AM    [facility]

## 2022-11-29 DIAGNOSIS — M5030 Other cervical disc degeneration, unspecified cervical region: Secondary | ICD-10-CM | POA: Diagnosis not present

## 2022-11-29 DIAGNOSIS — G959 Disease of spinal cord, unspecified: Secondary | ICD-10-CM | POA: Diagnosis not present

## 2022-11-29 DIAGNOSIS — Z6827 Body mass index (BMI) 27.0-27.9, adult: Secondary | ICD-10-CM | POA: Diagnosis not present

## 2022-12-12 ENCOUNTER — Ambulatory Visit: Payer: Medicare Other | Attending: Cardiology | Admitting: Cardiology

## 2022-12-12 VITALS — BP 163/74 | HR 73 | Ht 64.0 in | Wt 161.0 lb

## 2022-12-12 DIAGNOSIS — N179 Acute kidney failure, unspecified: Secondary | ICD-10-CM | POA: Diagnosis not present

## 2022-12-12 DIAGNOSIS — Z79899 Other long term (current) drug therapy: Secondary | ICD-10-CM | POA: Diagnosis not present

## 2022-12-12 DIAGNOSIS — Z7982 Long term (current) use of aspirin: Secondary | ICD-10-CM | POA: Diagnosis not present

## 2022-12-12 DIAGNOSIS — E782 Mixed hyperlipidemia: Secondary | ICD-10-CM

## 2022-12-12 DIAGNOSIS — I255 Ischemic cardiomyopathy: Secondary | ICD-10-CM

## 2022-12-12 DIAGNOSIS — Z7902 Long term (current) use of antithrombotics/antiplatelets: Secondary | ICD-10-CM | POA: Diagnosis not present

## 2022-12-12 DIAGNOSIS — E785 Hyperlipidemia, unspecified: Secondary | ICD-10-CM | POA: Diagnosis not present

## 2022-12-12 DIAGNOSIS — X58XXXA Exposure to other specified factors, initial encounter: Secondary | ICD-10-CM | POA: Diagnosis not present

## 2022-12-12 DIAGNOSIS — E860 Dehydration: Secondary | ICD-10-CM | POA: Diagnosis not present

## 2022-12-12 DIAGNOSIS — R9431 Abnormal electrocardiogram [ECG] [EKG]: Secondary | ICD-10-CM | POA: Diagnosis not present

## 2022-12-12 DIAGNOSIS — I444 Left anterior fascicular block: Secondary | ICD-10-CM | POA: Diagnosis not present

## 2022-12-12 DIAGNOSIS — Z794 Long term (current) use of insulin: Secondary | ICD-10-CM

## 2022-12-12 DIAGNOSIS — Z951 Presence of aortocoronary bypass graft: Secondary | ICD-10-CM | POA: Diagnosis not present

## 2022-12-12 DIAGNOSIS — E118 Type 2 diabetes mellitus with unspecified complications: Secondary | ICD-10-CM | POA: Diagnosis not present

## 2022-12-12 DIAGNOSIS — T50905A Adverse effect of unspecified drugs, medicaments and biological substances, initial encounter: Secondary | ICD-10-CM | POA: Diagnosis not present

## 2022-12-12 DIAGNOSIS — G928 Other toxic encephalopathy: Secondary | ICD-10-CM | POA: Diagnosis not present

## 2022-12-12 DIAGNOSIS — I10 Essential (primary) hypertension: Secondary | ICD-10-CM

## 2022-12-12 DIAGNOSIS — R5383 Other fatigue: Secondary | ICD-10-CM | POA: Diagnosis not present

## 2022-12-12 NOTE — Progress Notes (Signed)
Cardiology Office Note:    Date:  12/12/2022   ID:  Micha Dosanjh, DOB January 17, 1956, MRN [id number]  PCP:  Noni Saupe, MD  Cardiologist:  Gypsy Balsam, MD    Referring MD: Noni Saupe, MD   Chief Complaint  Patient presents with   Fatigue    History of Present Illness:    Kavon Valenza is a 67 y.o. male  with past medical history significant for coronary artery disease in 2021 he end up coming to the hospital episode of chest pain he was transferred to [facility], cardiac catheterization has been performed which was fine 90% mid LAD 90% intermediate branch, 80% stenosis of the circumflex as well as 70 external percent stenosis of the right coronary artery he was also found to have ischemic cardiomyopathy with ejection fraction 25 to 30%. He end up having coronary bypass graft done 5 grafts after that ejection fraction improved. Additional problem include diabetes which appears to be difficult to control, depression, dyslipidemia, orthostatic hypotension.  He walking today to my office complaining of being weak tired exhausted.  He said he woke up in the morning have absolutely no energy it took him some time to get out of the bed.  He checked his sugar which was 160, he checked his blood pressure his systolic blood pressure was about 105.  He looks very pale in the office and somewhat sleepy.  We tried to check his blood pressure however will have difficulty checking it.  With automatic machine with getting systolic blood pressure 160 which I doubt is correct.  He denies have any chest pain tightness squeezing pressure burning chest but the morning sensation he had he said this is exactly same thing happened when he was having problem before.  Past Medical History:  Diagnosis Date   Anxiety    Arteriosclerosis of coronary artery bypass graft 09/18/2020   Arthritis    Carotid artery disease (HCC) 2021   Mild, bilateral   Chronic kidney disease, stage 3a (HCC) 02/12/2022    Depression    Diabetes mellitus without complication (HCC)    Diabetic polyneuropathy (HCC) 09/18/2020   Essential hypertension 08/20/2019   History of endocrine disorder 09/18/2020   History of kidney stones    HNP (herniated nucleus pulposus) with myelopathy, cervical 07/31/2016   Hyperglycemia due to type 1 diabetes mellitus (HCC) 09/18/2020   Hyperlipidemia associated with type 2 diabetes mellitus (HCC) 08/20/2019   Hyperlipidemia, unspecified 09/18/2020   Hypertension    Ischemic cardiomyopathy 09/19/2020   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 from [facility] 08/20/2019   Long term (current) use of insulin (HCC) 09/18/2020   Multi-vessel coronary artery stenosis 08/20/2019   Orthostatic hypotension 05/21/2021   Presence of insulin pump (external) (internal) 09/18/2020   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/24/2019   Subsequent ST elevation (STEMI) myocardial infarction of anterior wall within 4 weeks of initial infarction (HCC) 08/20/2019   Type 2 diabetes mellitus with complication, with long-term current use of insulin (HCC) 08/20/2019    Past Surgical History:  Procedure Laterality Date   ANTERIOR CERVICAL DECOMP/DISCECTOMY FUSION N/A 07/31/2016   Procedure: ANTERIOR CERVICAL DECOMPRESSION/DISCECTOMY FUSION CERVICAL FOUR- CERVICAL FIVE, CERVICAL FIVE- CERVICAL SIX, CERVICAL SIX, CERVICAL SEVEN;  Surgeon: Shirlean Kelly, MD;  Location: [facility] OR;  Service: Neurosurgery;  Laterality: N/A;   COLONOSCOPY  04/13/2019   Dr. Jennye Boroughs, [facility]. Normal Colonoscopy to the terminal ileum but with a poor prep.   CORONARY ARTERY BYPASS GRAFT N/A  08/24/2019   Procedure: CORONARY ARTERY BYPASS GRAFTING (CABG), ON PUMP, TIMES FIVE, USING BILATERAL INTERNAL MAMMARY ARTERIES, ENDOSCOPICALLY HARVESTED RIGHT GREATER SAPHENOUS VEIN, AND OPEN LEFT RADIAL ARTERY HARVESTING;  Surgeon: Linden Dolin, MD;  Location: [facility] OR;  Service: Open Heart Surgery;  Laterality: N/A;   DG THUMB  RIGHT HAND ([facility] HX) Left    plate   ESOPHAGOGASTRODUODENOSCOPY  04/13/2019   Dr. Jennye Boroughs, [facility]. Mild Scattered gastritis. Small amount of food in the fundus   EYE SURGERY Bilateral    cataract removed   LEFT HEART CATH AND CORONARY ANGIOGRAPHY N/A 08/20/2019   Procedure: LEFT HEART CATH AND CORONARY ANGIOGRAPHY;  Surgeon: Marykay Lex, MD;  Location: [facility] INVASIVE CV LAB;  Service: Cardiovascular;  Laterality: N/A;   PENILE PROSTHESIS IMPLANT     RADIAL ARTERY HARVEST Left 08/24/2019   Procedure: RADIAL ARTERY HARVEST;  Surgeon: Linden Dolin, MD;  Location: [facility] OR;  Service: Open Heart Surgery;  Laterality: Left;   TEE WITHOUT CARDIOVERSION N/A 08/24/2019   Procedure: TRANSESOPHAGEAL ECHOCARDIOGRAM (TEE);  Surgeon: Linden Dolin, MD;  Location: [facility] OR;  Service: Open Heart Surgery;  Laterality: N/A;    Current Medications: Current Meds  Medication Sig   aspirin EC 81 MG EC tablet Take 1 tablet (81 mg total) by mouth daily. Swallow whole.   atorvastatin (LIPITOR) 80 MG tablet Take 1 tablet (80 mg total) by mouth daily.   clopidogrel (PLAVIX) 75 MG tablet Take 1 tablet (75 mg total) by mouth daily.   desvenlafaxine (PRISTIQ) 100 MG 24 hr tablet Take 100 mg by mouth daily.   famotidine (PEPCID) 20 MG tablet Take 20 mg by mouth daily.   gabapentin (NEURONTIN) 300 MG capsule Take 600 mg by mouth every morning. And 300 mg at night   HUMALOG 100 UNIT/ML injection Inject 1-25 Units into the skin as directed. According to sliding scale   midodrine (PROAMATINE) 10 MG tablet Take 1 tablet (10 mg total) by mouth 3 (three) times daily.   QUEtiapine (SEROQUEL) 200 MG tablet Take 200 mg by mouth at bedtime.     Allergies:   Patient has no known allergies.   Social History   Socioeconomic History   Marital status: Married    Spouse name: Not on file   Number of children: Not on file   Years of education: Not on file   Highest education level: Not on file  Occupational  History   Occupation: Production designer, theatre/television/film    Comment: [employer]  Tobacco Use   Smoking status: Never   Smokeless tobacco: Never  Vaping Use   Vaping status: Never Used  Substance and Sexual Activity   Alcohol use: No   Drug use: No   Sexual activity: Yes  Other Topics Concern   Not on file  Social History Narrative   Works for [employer] business   Social Determinants of Health   Financial Resource Strain: Not on file  Food Insecurity: Not on file  Transportation Needs: Not on file  Physical Activity: Not on file  Stress: Not on file  Social Connections: Not on file     Family History: The patient's family history includes Dementia in his mother; Heart disease in his father. ROS:   Please see the history of present illness.    All 14 point review of systems negative except as described per history of present illness  EKGs/Labs/Other Studies Reviewed:    EKG Interpretation Date/Time:  Thursday December 12 2022 10:13:50 EST  Ventricular Rate:  73 PR Interval:  146 QRS Duration:  74 QT Interval:  360 QTC Calculation: 396 R Axis:   -67  Text Interpretation: Normal sinus rhythm Left axis deviation Inferior infarct , age undetermined Anteroseptal infarct (cited on or before 21-Aug-2019) T wave abnormality, consider lateral ischemia Abnormal ECG When compared with ECG of 17-Oct-2022 13:58, QRS axis Shifted left Inferior infarct is now Present Confirmed by Gypsy Balsam [phone]) on 12/12/2022 10:40:17 AM    Recent Labs: 02/21/2022: ALT 30 07/04/2022: Hemoglobin 13.0; Platelets 214 10/22/2022: BUN 23; Creatinine, Ser 1.81; Potassium 4.8; Sodium 142  Recent Lipid Panel    Component Value Date/Time   CHOL 124 02/21/2022 0845   TRIG 95 02/21/2022 0845   HDL 46 02/21/2022 0845   CHOLHDL 2.7 02/21/2022 0845   CHOLHDL 2.9 08/21/2019 0757   VLDL 8 08/21/2019 0757   LDLCALC 60 02/21/2022 0845   LDLDIRECT 61 07/04/2022 1522    Physical Exam:    VS:  BP (!) 163/74    Pulse 73   Ht 5\' 4"  (1.626 m)   Wt 161 lb (73 kg)   BMI 27.64 kg/m     Wt Readings from Last 3 Encounters:  12/12/22 161 lb (73 kg)  11/28/22 161 lb 9.6 oz (73.3 kg)  10/17/22 156 lb 9.6 oz (71 kg)     GEN:  Well nourished, well developed in no acute distress HEENT: Normal NECK: No JVD; No carotid bruits LYMPHATICS: No lymphadenopathy CARDIAC: RRR, no murmurs, no rubs, no gallops RESPIRATORY:  Clear to auscultation without rales, wheezing or rhonchi  ABDOMEN: Soft, non-tender, non-distended MUSCULOSKELETAL:  No edema; No deformity  SKIN: Warm and dry LOWER EXTREMITIES: no swelling NEUROLOGIC:  Alert and oriented x 3 PSYCHIATRIC:  Normal affect   ASSESSMENT:    1. Essential hypertension   2. 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 Mixed hyperlipidemia   4. Type 2 diabetes mellitus with complication, with long-term current use of insulin (HCC)   5. Ischemic cardiomyopathy    PLAN:    In order of problems listed above:  Coronary artery disease status post coronary bypass graft.  Get some symptoms that are worrisome.  He has longstanding diabetes and his presentation before was also atypical.  I worry about potentially having coronary event.  I offered him an ambulance and driving to the hospital he refused that he prefer his wife to take him we had a long discussion about this he promised me that his wife will take him straight from our office to emergency room which is just across the street.  He need to be evaluated for acute coronary event.  He may require stress testing after that if biochemical markers were negative. Low blood pressure.  He will require some fluids. Dyslipidemia taking cholesterol medication recently increased dose of Lipitor to 80 mg daily. History of ischemic cardiomyopathy, last echocardiogram done in January of this year showed ejection fraction 45 to 50%.  New echocardiogram is pending and is scheduled on December 3.  Disposition is to transfer him to the  emergency room for evaluation   Medication Adjustments/Labs and Tests Ordered: Current medicines are reviewed at length with the patient today.  Concerns regarding medicines are outlined above.  Orders Placed This Encounter  Procedures   EKG 12-Lead   Medication changes: No orders of the defined types were placed in this encounter.   Signed, Georgeanna Lea, MD, [facility] 12/12/2022 10:41 AM    [facility]

## 2022-12-13 DIAGNOSIS — E860 Dehydration: Secondary | ICD-10-CM | POA: Diagnosis not present

## 2022-12-13 DIAGNOSIS — T50905A Adverse effect of unspecified drugs, medicaments and biological substances, initial encounter: Secondary | ICD-10-CM | POA: Diagnosis not present

## 2022-12-13 DIAGNOSIS — N179 Acute kidney failure, unspecified: Secondary | ICD-10-CM | POA: Diagnosis not present

## 2022-12-13 DIAGNOSIS — G928 Other toxic encephalopathy: Secondary | ICD-10-CM | POA: Diagnosis not present

## 2022-12-16 DIAGNOSIS — M4802 Spinal stenosis, cervical region: Secondary | ICD-10-CM | POA: Diagnosis not present

## 2022-12-24 DIAGNOSIS — E1065 Type 1 diabetes mellitus with hyperglycemia: Secondary | ICD-10-CM | POA: Diagnosis not present

## 2022-12-24 DIAGNOSIS — Z794 Long term (current) use of insulin: Secondary | ICD-10-CM | POA: Diagnosis not present

## 2023-01-07 ENCOUNTER — Ambulatory Visit: Payer: Medicare Other

## 2023-01-10 ENCOUNTER — Ambulatory Visit: Payer: Medicare Other | Attending: Cardiology

## 2023-01-10 DIAGNOSIS — R0609 Other forms of dyspnea: Secondary | ICD-10-CM | POA: Diagnosis not present

## 2023-01-10 LAB — ECHOCARDIOGRAM COMPLETE
Area-P 1/2: 3.65 cm2
S' Lateral: 3.5 cm

## 2023-01-10 MED ORDER — PERFLUTREN LIPID MICROSPHERE
1.0000 mL | INTRAVENOUS | Status: AC | PRN
  Administered 2023-01-10: 8 mL via INTRAVENOUS

## 2023-01-13 DIAGNOSIS — M47812 Spondylosis without myelopathy or radiculopathy, cervical region: Secondary | ICD-10-CM | POA: Diagnosis not present

## 2023-01-13 DIAGNOSIS — M5412 Radiculopathy, cervical region: Secondary | ICD-10-CM | POA: Diagnosis not present

## 2023-01-13 DIAGNOSIS — Z6827 Body mass index (BMI) 27.0-27.9, adult: Secondary | ICD-10-CM | POA: Diagnosis not present

## 2023-01-13 DIAGNOSIS — M542 Cervicalgia: Secondary | ICD-10-CM | POA: Diagnosis not present

## 2023-01-15 DIAGNOSIS — E1022 Type 1 diabetes mellitus with diabetic chronic kidney disease: Secondary | ICD-10-CM | POA: Diagnosis not present

## 2023-01-15 DIAGNOSIS — I951 Orthostatic hypotension: Secondary | ICD-10-CM | POA: Diagnosis not present

## 2023-01-15 DIAGNOSIS — N2581 Secondary hyperparathyroidism of renal origin: Secondary | ICD-10-CM | POA: Diagnosis not present

## 2023-01-15 DIAGNOSIS — N1832 Chronic kidney disease, stage 3b: Secondary | ICD-10-CM | POA: Diagnosis not present

## 2023-01-16 DIAGNOSIS — Z794 Long term (current) use of insulin: Secondary | ICD-10-CM | POA: Diagnosis not present

## 2023-01-16 DIAGNOSIS — E1065 Type 1 diabetes mellitus with hyperglycemia: Secondary | ICD-10-CM | POA: Diagnosis not present

## 2023-01-16 DIAGNOSIS — Z9641 Presence of insulin pump (external) (internal): Secondary | ICD-10-CM | POA: Diagnosis not present

## 2023-01-24 DIAGNOSIS — E1065 Type 1 diabetes mellitus with hyperglycemia: Secondary | ICD-10-CM | POA: Diagnosis not present

## 2023-01-24 DIAGNOSIS — Z794 Long term (current) use of insulin: Secondary | ICD-10-CM | POA: Diagnosis not present

## 2023-01-30 ENCOUNTER — Telehealth: Payer: Self-pay | Admitting: Cardiology

## 2023-01-30 ENCOUNTER — Telehealth: Payer: Self-pay

## 2023-01-30 NOTE — Telephone Encounter (Signed)
Pt c/o medication issue:  1. Name of Medication:   midodrine (PROAMATINE) 10 MG tablet    2. How are you currently taking this medication (dosage and times per day)? Take 1 tablet (10 mg total) by mouth 3 (three) times daily.   3. Are you having a reaction (difficulty breathing--STAT)? No  4. What is your medication issue? Pt would like to know if dosage of medication is able to be increase due to BP being low in the mornings. Pt also wants to f/u on Echo results. Please advise

## 2023-01-30 NOTE — Telephone Encounter (Signed)
Patient notified of results and verbalized understanding.  

## 2023-01-30 NOTE — Telephone Encounter (Signed)
-----   Message from Gypsy Balsam sent at 01/21/2023  8:26 PM EST ----- Echocardiogram showed ejection fraction of being normal, overall looks good

## 2023-01-31 NOTE — Telephone Encounter (Signed)
Spoke with pt about Midodrine dose. He wanted to know if there is anything else he can take. He stated that early in the day his BP is as low as 74 systolic and then 120 systolic after lunch.

## 2023-02-07 ENCOUNTER — Telehealth: Payer: Self-pay

## 2023-02-07 MED ORDER — DROXIDOPA 100 MG PO CAPS: 100.0000 mg | ORAL_CAPSULE | Freq: Three times a day (TID) | ORAL | 0 refills | Status: DC

## 2023-02-07 NOTE — Telephone Encounter (Signed)
 Per Dr. Bing Matter - Rx for Northera 100mg  1 tablet tid in place of Midodrine for low BP. Unable to leave reach pt or leave VM.

## 2023-02-07 NOTE — Telephone Encounter (Signed)
 Spoke with pt. Sent Northera 100mg  tid to [facility].

## 2023-02-10 ENCOUNTER — Telehealth: Payer: Self-pay | Admitting: Pharmacy Technician

## 2023-02-10 ENCOUNTER — Telehealth: Payer: Self-pay | Admitting: Cardiology

## 2023-02-10 ENCOUNTER — Other Ambulatory Visit (HOSPITAL_COMMUNITY): Payer: Self-pay

## 2023-02-10 ENCOUNTER — Other Ambulatory Visit: Payer: Self-pay | Admitting: Cardiology

## 2023-02-10 NOTE — Telephone Encounter (Signed)
 Pharmacy Patient Advocate Encounter   Received notification from Pt Calls Messages that prior authorization for northera  is required/requested.   Insurance verification completed.   The patient is insured through [facility] .   Per test claim: PA required; PA submitted to above mentioned insurance via CoverMyMeds Key/confirmation #/EOC [id number] Status is pending

## 2023-02-10 NOTE — Telephone Encounter (Signed)
 Northera needs a PA done.

## 2023-02-10 NOTE — Telephone Encounter (Signed)
 Pt c/o medication issue:  1. Name of Medication: Northera  100mg    2. How are you currently taking this medication (dosage and times per day)?   3. Are you having a reaction (difficulty breathing--STAT)?   4. What is your medication issue?  Patient is requesting call back to discuss this medication and cost. States that he is having issues with this being covered. Please advise.

## 2023-02-11 ENCOUNTER — Telehealth: Payer: Self-pay | Admitting: Pharmacy Technician

## 2023-02-11 NOTE — Telephone Encounter (Signed)
 Pharmacy Patient Advocate Encounter   Received notification from Physician's Office that prior authorization for droxidopa  is required/requested.   Insurance verification completed.   The patient is insured through [facility] .   Per test claim: PA required; PA submitted to above mentioned insurance via CoverMyMeds Key/confirmation #/EOC [id number] Status is pending

## 2023-02-11 NOTE — Telephone Encounter (Signed)
*  STAT* If patient is at the pharmacy, call can be transferred to refill team.   1. Which medications need to be refilled? (please list name of each medication and dose if known) needs prior authorization for Droxidopa    2. Would you like to learn more about the convenience, safety, & potential cost savings by using the [facility]?      3. Are you open to using the [facility] (Type [facility].    4. Which pharmacy/location (including street and city if local pharmacy) is medication to be sent to?   5. Do they need a 30 day or 90 day supply?

## 2023-02-11 NOTE — Telephone Encounter (Signed)
-----   Message from Nurse Emmit Alexanders sent at 02/10/2023  5:13 PM EST ----- Prior Auth for Droxidopa 100mg   Key [id number]- Cover My Meds TY Dede

## 2023-02-12 ENCOUNTER — Telehealth: Payer: Self-pay

## 2023-02-12 NOTE — Telephone Encounter (Signed)
 Sent appeal with extra documentation

## 2023-02-12 NOTE — Telephone Encounter (Signed)
 duplicate

## 2023-02-12 NOTE — Telephone Encounter (Signed)
 Date: 02/12/2023 Time:7:09 AM Phone: [phone]  MSG: Patient stated that he needs to reschedule his appointment for today due to his blood pressure being too low to drive. He said his blood pressure is 98/58. He said that he will call back later to reschedule.  Looks like his appointment is not until 02/19/2023. Can a nurse please triage him.

## 2023-02-12 NOTE — Telephone Encounter (Signed)
 Patient called. Unable to reach patient. Voicemail is full.

## 2023-02-13 NOTE — Telephone Encounter (Signed)
 Marland Kitchen

## 2023-02-14 ENCOUNTER — Other Ambulatory Visit (HOSPITAL_COMMUNITY): Payer: Self-pay

## 2023-02-14 ENCOUNTER — Telehealth: Payer: Self-pay | Admitting: Cardiology

## 2023-02-14 NOTE — Telephone Encounter (Signed)
 Pharmacy Patient Advocate Encounter  Received notification from [facility] that Prior Authorization for droxidopa  has been APPROVED from 02/14/23 to 02/14/24. Ran test claim, Copay is $36.40 one month. This test claim was processed through [facility]- copay amounts may vary at other pharmacies due to pharmacy/plan contracts, or as the patient moves through the different stages of their insurance plan.   PA #/Case ID/Reference #: [id number]

## 2023-02-14 NOTE — Telephone Encounter (Signed)
 Calling with Jonathan Holder #[id number] medication has been approve. Please advise

## 2023-02-16 ENCOUNTER — Other Ambulatory Visit: Payer: Self-pay | Admitting: Cardiology

## 2023-02-17 NOTE — Telephone Encounter (Signed)
 Spoke with pt regarding New medication. Advised that the PA was obtained and it has been approved through [facility].

## 2023-02-19 ENCOUNTER — Ambulatory Visit: Payer: Medicare Other | Admitting: Family Medicine

## 2023-02-20 ENCOUNTER — Other Ambulatory Visit (HOSPITAL_BASED_OUTPATIENT_CLINIC_OR_DEPARTMENT_OTHER): Payer: Self-pay

## 2023-02-20 MED ORDER — DROXIDOPA 100 MG PO CAPS
100.0000 mg | ORAL_CAPSULE | Freq: Three times a day (TID) | ORAL | 12 refills | Status: DC
  Filled 2023-02-20: qty 90, 30d supply, fill #0
  Filled 2023-03-25: qty 90, 30d supply, fill #1
  Filled 2023-04-28: qty 90, 30d supply, fill #2

## 2023-02-21 ENCOUNTER — Other Ambulatory Visit (HOSPITAL_BASED_OUTPATIENT_CLINIC_OR_DEPARTMENT_OTHER): Payer: Self-pay

## 2023-02-23 DIAGNOSIS — Z794 Long term (current) use of insulin: Secondary | ICD-10-CM | POA: Diagnosis not present

## 2023-02-23 DIAGNOSIS — E1065 Type 1 diabetes mellitus with hyperglycemia: Secondary | ICD-10-CM | POA: Diagnosis not present

## 2023-03-25 ENCOUNTER — Other Ambulatory Visit (HOSPITAL_BASED_OUTPATIENT_CLINIC_OR_DEPARTMENT_OTHER): Payer: Self-pay

## 2023-03-25 DIAGNOSIS — Z794 Long term (current) use of insulin: Secondary | ICD-10-CM | POA: Diagnosis not present

## 2023-03-25 DIAGNOSIS — E1065 Type 1 diabetes mellitus with hyperglycemia: Secondary | ICD-10-CM | POA: Diagnosis not present

## 2023-03-26 ENCOUNTER — Other Ambulatory Visit (HOSPITAL_BASED_OUTPATIENT_CLINIC_OR_DEPARTMENT_OTHER): Payer: Self-pay

## 2023-03-29 DIAGNOSIS — M549 Dorsalgia, unspecified: Secondary | ICD-10-CM | POA: Diagnosis not present

## 2023-04-16 DIAGNOSIS — E875 Hyperkalemia: Secondary | ICD-10-CM | POA: Diagnosis not present

## 2023-04-16 DIAGNOSIS — N1832 Chronic kidney disease, stage 3b: Secondary | ICD-10-CM | POA: Diagnosis not present

## 2023-04-16 DIAGNOSIS — E1022 Type 1 diabetes mellitus with diabetic chronic kidney disease: Secondary | ICD-10-CM | POA: Diagnosis not present

## 2023-04-16 DIAGNOSIS — N2581 Secondary hyperparathyroidism of renal origin: Secondary | ICD-10-CM | POA: Diagnosis not present

## 2023-04-16 DIAGNOSIS — I951 Orthostatic hypotension: Secondary | ICD-10-CM | POA: Diagnosis not present

## 2023-04-16 DIAGNOSIS — F332 Major depressive disorder, recurrent severe without psychotic features: Secondary | ICD-10-CM | POA: Diagnosis not present

## 2023-04-17 ENCOUNTER — Ambulatory Visit (HOSPITAL_BASED_OUTPATIENT_CLINIC_OR_DEPARTMENT_OTHER)
Admission: RE | Admit: 2023-04-17 | Discharge: 2023-04-17 | Disposition: A | Discharge status: Home / Self Care | Source: Ambulatory Visit | Attending: Student | Admitting: Student

## 2023-04-17 ENCOUNTER — Ambulatory Visit (HOSPITAL_BASED_OUTPATIENT_CLINIC_OR_DEPARTMENT_OTHER): Payer: Medicare Other | Admitting: Student

## 2023-04-17 ENCOUNTER — Encounter (HOSPITAL_BASED_OUTPATIENT_CLINIC_OR_DEPARTMENT_OTHER): Payer: Self-pay | Admitting: Student

## 2023-04-17 VITALS — BP 127/72 | HR 76 | Temp 98.0°F | Ht 63.78 in | Wt 156.4 lb

## 2023-04-17 DIAGNOSIS — M5116 Intervertebral disc disorders with radiculopathy, lumbar region: Secondary | ICD-10-CM | POA: Diagnosis not present

## 2023-04-17 DIAGNOSIS — N1831 Chronic kidney disease, stage 3a: Secondary | ICD-10-CM | POA: Diagnosis not present

## 2023-04-17 DIAGNOSIS — E1065 Type 1 diabetes mellitus with hyperglycemia: Secondary | ICD-10-CM

## 2023-04-17 DIAGNOSIS — M545 Low back pain, unspecified: Secondary | ICD-10-CM | POA: Diagnosis not present

## 2023-04-17 DIAGNOSIS — W19XXXA Unspecified fall, initial encounter: Secondary | ICD-10-CM

## 2023-04-17 DIAGNOSIS — M5441 Lumbago with sciatica, right side: Secondary | ICD-10-CM | POA: Diagnosis not present

## 2023-04-17 DIAGNOSIS — M810 Age-related osteoporosis without current pathological fracture: Secondary | ICD-10-CM | POA: Diagnosis not present

## 2023-04-17 DIAGNOSIS — I951 Orthostatic hypotension: Secondary | ICD-10-CM

## 2023-04-17 DIAGNOSIS — I2510 Atherosclerotic heart disease of native coronary artery without angina pectoris: Secondary | ICD-10-CM

## 2023-04-17 DIAGNOSIS — M79604 Pain in right leg: Secondary | ICD-10-CM | POA: Diagnosis not present

## 2023-04-17 DIAGNOSIS — Z9641 Presence of insulin pump (external) (internal): Secondary | ICD-10-CM

## 2023-04-17 DIAGNOSIS — M160 Bilateral primary osteoarthritis of hip: Secondary | ICD-10-CM | POA: Diagnosis not present

## 2023-04-17 DIAGNOSIS — Z794 Long term (current) use of insulin: Secondary | ICD-10-CM

## 2023-04-17 DIAGNOSIS — Z7689 Persons encountering health services in other specified circumstances: Secondary | ICD-10-CM

## 2023-04-17 HISTORY — DX: Lumbago with sciatica, right side: M54.41

## 2023-04-17 HISTORY — DX: Unspecified fall, initial encounter: W19.XXXA

## 2023-04-17 MED ORDER — DEXAMETHASONE SODIUM PHOSPHATE 10 MG/ML IJ SOLN
10.0000 mg | Freq: Once | INTRAMUSCULAR | Status: AC
  Administered 2023-04-17: 10 mg via INTRAMUSCULAR

## 2023-04-17 NOTE — Assessment & Plan Note (Signed)
 Continue to follow with endocrinology

## 2023-04-17 NOTE — Assessment & Plan Note (Addendum)
 Hip and Pelvic Xrays appear to be negative for any acute fractures. Spinal column appears intact. No apparent femoral neck fractures, though femoral head appears to have major changes present- likely arthritic.  Discussed OTC pain relief. Discussed following up with Ortho or going to an Ortho urgent care.

## 2023-04-17 NOTE — Assessment & Plan Note (Signed)
 Followed by cardiology. Now taking droxidopa.

## 2023-04-17 NOTE — Assessment & Plan Note (Signed)
 Continue to follow with cardiology.

## 2023-04-17 NOTE — Patient Instructions (Addendum)
 It was nice to see you today!  As we discussed in clinic I highly recommend that you keep a close eye on your blood sugar due to the effects of steroids.  I will let you know the results of your x-ray when they come back.  For your back pain I also recommend a topical pain reliever such as Voltaren gel or a topical menthol gel which can be obtained over-the-counter.  Apply that to the painful area as needed. You can also alternate ice and heat to the area.  If you cannot get in with your ortho doctor, I would recommend heading to [facility] in [place] to be checked out.   If you have any problems before your next visit feel free to message me via MyChart (minor issues or questions) or call the office, otherwise you may reach out to schedule an office visit.  Thank you! Gerilyn Pilgrim Adiba Fargnoli, PA-C

## 2023-04-17 NOTE — Progress Notes (Signed)
 New Patient Office Visit  Subjective    Patient ID: Jonathan Holder, male    DOB: 1955/08/21  Age: 68 y.o. MRN: [id number]  CC:  Chief Complaint  Patient presents with   Establish Care    Here to establish care. Normally has low BP and has to take meds to bring it up or feels like he will fall asleep.    Back Pain    Lower back pain from left to right side that runs down to buttocks. Had cortisone shot last week at an UC and felt better immediately but it wore off and feels worse now. Was told to talk to orthopedic.     HPI Jonathan Holder presents to establish care. Prior PCP was Dr. Jeanie Sewer at [facility]. Last physical was early last year.  Follows with neph, cards, endo, psych, and ophtho (Dr. Renae Fickle in [place]- low vision specialist).   Type 1 diabetes with hyperglycemia and presence of insulin pump- Follows with endocrinology. Getting foot checks with endocrine. States that things are going well with endocrinology.  Hypotension (w history of hypertension)-follows with cardiology with Dr. Kirtland Bouchard. Has been tried on midodrine. 5/7 days he feels that he is okay. States that it was 96/46- states that he does not drive when it is below 409. Now taking droxidopa.  01/10/2023 ECHO left ventricular ejection fraction is 55 to 60%. 02/22/2022 ECHO 45 to 50%, grade 2 DD, mild MR, aortic valve sclerosis is present without stenosis 08/24/2019 CABG x 5- BP began bottoming out after this. 08/20/2019 left heart cath severe multivessel CAD  CKD 3a- Follows Doctor Hiouchi with [facility]. No issues noted. Knows to abstain from NSAID products.  Screenings:  Colon Cancer: Dr. Braulio Conte- had one in the past year. 10 year follow up. Lung Cancer: no Diabetes: indicated  Ophthalmology: has ophtho in [place]  Foot: exams done at endocrine HLD: Lipitor 80 mg.   Acute Problems: Back pain- a couple of weeks ago he noticed sharp pain in his back and across his butt. Going down R leg with  "electrical" pain. Has to slowly raise up. Went to urgent care and was provided with a shot of toradol- did not get steroid shot because they were concerned about his blood glucose. States that steroid shots tend to go well for him in regards to his blood sugar. Patient remembered that he did have a fall recently- in which he fell on his right side. Denies hitting his head.  No pain when he is laying down. No muscle relaxer due to high fall risk. Using heating pad.  Patient is agreeable to a steroid shot today and agrees with risk on blood sugar.  Outpatient Encounter Medications as of 04/17/2023  Medication Sig   aspirin EC 81 MG EC tablet Take 1 tablet (81 mg total) by mouth daily. Swallow whole.   atorvastatin (LIPITOR) 80 MG tablet Take 1 tablet (80 mg total) by mouth daily.   clopidogrel (PLAVIX) 75 MG tablet TAKE 1 TABLET(75 MG) BY MOUTH DAILY   Continuous Glucose Sensor MISC by Does not apply route.   droxidopa (NORTHERA) 100 MG CAPS Take 1 capsule (100 mg total) by mouth 3 (three) times daily with meals.   famotidine (PEPCID) 20 MG tablet Take 20 mg by mouth daily.   gabapentin (NEURONTIN) 300 MG capsule Take 300 mg by mouth daily. And 300 mg at night   HUMALOG 100 UNIT/ML injection Inject 1-25 Units into the skin as directed. According to sliding scale  QUEtiapine (SEROQUEL) 300 MG tablet Take 200 mg by mouth at bedtime.   sodium chloride 1 g tablet Take 1 g by mouth daily.   VELTASSA 8.4 g packet Take 1 packet by mouth daily.   desvenlafaxine (PRISTIQ) 100 MG 24 hr tablet Take 100 mg by mouth daily. (Patient not taking: Reported on 04/17/2023)   [EXPIRED] dexamethasone (DECADRON) injection 10 mg    No facility-administered encounter medications on file as of 04/17/2023.    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   Hyperglycemia due to type 1 diabetes mellitus (HCC) 09/18/2020   Hyperlipidemia associated with type 2 diabetes mellitus (HCC) 08/20/2019   Hyperlipidemia, unspecified 09/18/2020   Hypertension    Ischemic cardiomyopathy 09/19/2020   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 [facility] 08/20/2019   Long term (current) use of insulin (HCC) 09/18/2020   Multi-vessel coronary artery stenosis 08/20/2019   Orthostatic hypotension 05/21/2021   Presence of insulin pump (external) (internal) 09/18/2020   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/24/2019   Subsequent ST elevation (STEMI) myocardial infarction of anterior wall within 4 weeks of initial infarction (HCC) 08/20/2019   Type 2 diabetes mellitus with complication, with long-term current use of insulin (HCC) 08/20/2019    Past Surgical History:  Procedure Laterality Date   ANTERIOR CERVICAL DECOMP/DISCECTOMY FUSION N/A 07/31/2016   Procedure: ANTERIOR CERVICAL DECOMPRESSION/DISCECTOMY FUSION CERVICAL FOUR- CERVICAL FIVE, CERVICAL FIVE- CERVICAL SIX, CERVICAL SIX, CERVICAL SEVEN;  Surgeon: Shirlean Kelly, MD;  Location: [facility] OR;  Service: Neurosurgery;  Laterality: N/A;   COLONOSCOPY  04/13/2019   Dr. Jennye Boroughs, [facility]. Normal Colonoscopy to the terminal ileum but with a poor prep.   CORONARY ARTERY BYPASS GRAFT N/A 08/24/2019   Procedure: CORONARY ARTERY BYPASS GRAFTING (CABG), ON PUMP, TIMES FIVE, USING BILATERAL INTERNAL MAMMARY ARTERIES, ENDOSCOPICALLY HARVESTED RIGHT GREATER SAPHENOUS VEIN, AND OPEN LEFT RADIAL ARTERY HARVESTING;  Surgeon: Linden Dolin, MD;  Location: [facility] OR;  Service: Open Heart Surgery;   Laterality: N/A;   DG THUMB RIGHT HAND ([facility] HX) Left    plate   ESOPHAGOGASTRODUODENOSCOPY  04/13/2019   Dr. Jennye Boroughs, [facility]. Mild Scattered gastritis. Small amount of food in the fundus   EYE SURGERY Bilateral    cataract removed   LEFT HEART CATH AND CORONARY ANGIOGRAPHY N/A 08/20/2019   Procedure: LEFT HEART CATH AND CORONARY ANGIOGRAPHY;  Surgeon: Marykay Lex, MD;  Location: [facility] INVASIVE CV LAB;  Service: Cardiovascular;  Laterality: N/A;   PENILE PROSTHESIS IMPLANT     RADIAL ARTERY HARVEST Left 08/24/2019   Procedure: RADIAL ARTERY HARVEST;  Surgeon: Linden Dolin, MD;  Location: [facility] OR;  Service: Open Heart Surgery;  Laterality: Left;   TEE WITHOUT CARDIOVERSION N/A 08/24/2019   Procedure: TRANSESOPHAGEAL ECHOCARDIOGRAM (TEE);  Surgeon: Linden Dolin, MD;  Location: [facility] OR;  Service: Open Heart Surgery;  Laterality: N/A;    Family History  Problem Relation Age of Onset   Dementia Mother    Heart disease Father    Kidney  disease Father     Social History   Socioeconomic History   Marital status: Married    Spouse name: Not on file   Number of children: Not on file   Years of education: Not on file   Highest education level: Not on file  Occupational History   Occupation: Production designer, theatre/television/film    Comment: [employer]  Tobacco Use   Smoking status: Never    Passive exposure: Never   Smokeless tobacco: Never  Vaping Use   Vaping status: Never Used  Substance and Sexual Activity   Alcohol use: No   Drug use: No   Sexual activity: Yes  Other Topics Concern   Not on file  Social History Narrative   1 step daughter    Works for [employer] business   Social Drivers of Health   Financial Resource Strain: Not on file  Food Insecurity: No Food Insecurity (04/17/2023)   Hunger Vital Sign    Worried About Running Out of Food in the Last Year: Never true    Ran Out of Food in the Last Year: Never true  Transportation Needs: No Transportation  Needs (04/17/2023)   PRAPARE - Administrator, Civil Service (Medical): No    Lack of Transportation (Non-Medical): No  Physical Activity: Not on file  Stress: Not on file  Social Connections: Not on file  Intimate Partner Violence: Not At Risk (04/17/2023)   Humiliation, Afraid, Rape, and Kick questionnaire    Fear of Current or Ex-Partner: No    Emotionally Abused: No    Physically Abused: No    Sexually Abused: No    ROS  Per HPI      Objective    BP 127/72 (BP Location: Left Arm, Patient Position: Sitting, Cuff Size: Normal)   Pulse 76   Temp 98 F (36.7 C) (Oral)   Ht 5' 3.78" (1.62 m)   Wt 156 lb 6.4 oz (70.9 kg)   SpO2 96%   BMI 27.03 kg/m   Physical Exam Constitutional:      General: He is not in acute distress.    Appearance: Normal appearance. He is not ill-appearing.  HENT:     Head: Normocephalic and atraumatic.     Right Ear: External ear normal.     Left Ear: External ear normal.     Nose: Nose normal.     Mouth/Throat:     Mouth: Mucous membranes are moist.     Pharynx: Oropharynx is clear.  Eyes:     General: No scleral icterus.    Extraocular Movements: Extraocular movements intact.     Conjunctiva/sclera: Conjunctivae normal.     Pupils: Pupils are equal, round, and reactive to light.  Neck:     Vascular: No carotid bruit.  Cardiovascular:     Rate and Rhythm: Normal rate and regular rhythm.     Pulses: Normal pulses.     Heart sounds: Normal heart sounds. No murmur heard.    No friction rub.  Pulmonary:     Effort: Pulmonary effort is normal. No respiratory distress.     Breath sounds: Normal breath sounds. No wheezing, rhonchi or rales.  Musculoskeletal:        General: Normal range of motion.     Cervical back: Neck supple.     Right lower leg: Edema present.     Left lower leg: Edema present.     Comments: Pain to palpation of lower muscular portion of lumbar spine to  upper gluteal region.  Skin:    General: Skin is  warm and dry.     Coloration: Skin is not jaundiced or pale.  Neurological:     General: No focal deficit present.     Mental Status: He is alert.  Psychiatric:        Mood and Affect: Mood normal.        Behavior: Behavior normal.         Assessment & Plan:   Encounter to establish care  Multi-vessel coronary artery stenosis Assessment & Plan: Continue to follow with cardiology.    Presence of insulin pump (external) (internal) Assessment & Plan: Continue to follow with endocrinology.   Long term (current) use of insulin (HCC) Assessment & Plan: Continue to follow with endocrinology.   Chronic kidney disease, stage 3a [facility]) Assessment & Plan: Continue to follow with nephrology. Avoid unnecessary NSAIDs and contrast.   Hyperglycemia due to type 1 diabetes mellitus [facility]) Assessment & Plan: Continue to follow with endocrine. Discussed watching blood sugar closely due to risk of hypoglycemia concurrent with steroids.   Fall, initial encounter Assessment & Plan: Ordered x-ray-findings associated with acute bilateral low back pain.  Orders: -     DG Lumbar Spine 2-3 Views -     DG Pelvis 1-2 Views  Acute bilateral low back pain with right-sided sciatica Assessment & Plan: Hip and Pelvic Xrays appear to be negative for any acute fractures. Spinal column appears intact. No apparent femoral neck fractures, though femoral head appears to have major changes present- likely arthritic.  Discussed OTC pain relief. Discussed following up with Ortho or going to an Ortho urgent care.  Orders: -     DG Lumbar Spine 2-3 Views -     DG Pelvis 1-2 Views -     dexAMETHasone Sodium Phosphate  Orthostatic hypotension Assessment & Plan: Followed by cardiology. Now taking droxidopa.    I have spent greater than 60 minutes charting, educating, diagnosing and managing this patient for this visit.   Return in about 3 months (around 07/18/2023), or if symptoms worsen or  fail to improve, for Chronic Followup.   Teryl Lucy Jouri Threat, PA-C

## 2023-04-17 NOTE — Assessment & Plan Note (Signed)
 Continue to follow with endocrine. Discussed watching blood sugar closely due to risk of hypoglycemia concurrent with steroids.

## 2023-04-17 NOTE — Assessment & Plan Note (Signed)
 Continue to follow with nephrology. Avoid unnecessary NSAIDs and contrast.

## 2023-04-17 NOTE — Assessment & Plan Note (Signed)
 Ordered x-ray-findings associated with acute bilateral low back pain.

## 2023-04-18 DIAGNOSIS — K5900 Constipation, unspecified: Secondary | ICD-10-CM | POA: Diagnosis not present

## 2023-04-21 DIAGNOSIS — E1065 Type 1 diabetes mellitus with hyperglycemia: Secondary | ICD-10-CM | POA: Diagnosis not present

## 2023-04-28 ENCOUNTER — Other Ambulatory Visit (HOSPITAL_BASED_OUTPATIENT_CLINIC_OR_DEPARTMENT_OTHER): Payer: Self-pay

## 2023-04-29 ENCOUNTER — Other Ambulatory Visit (HOSPITAL_BASED_OUTPATIENT_CLINIC_OR_DEPARTMENT_OTHER): Payer: Self-pay

## 2023-05-07 NOTE — Progress Notes (Signed)
 Called patient and discussed results of both xrays- pelvic and lumbar. He is still having quite a bit of pain and stiffness in his lower back. He is planning to make an appointment with an ortho doc soon that he has already been established with.

## 2023-05-16 DIAGNOSIS — H353131 Nonexudative age-related macular degeneration, bilateral, early dry stage: Secondary | ICD-10-CM | POA: Diagnosis not present

## 2023-05-16 DIAGNOSIS — E113393 Type 2 diabetes mellitus with moderate nonproliferative diabetic retinopathy without macular edema, bilateral: Secondary | ICD-10-CM | POA: Diagnosis not present

## 2023-05-28 ENCOUNTER — Telehealth: Payer: Self-pay | Admitting: Cardiology

## 2023-05-28 NOTE — Telephone Encounter (Signed)
 Pt c/o medication issue:  1. Name of Medication: droxidopa  (NORTHERA ) 100 MG CAPS   2. How are you currently taking this medication (dosage and times per day)? As written, 3x times daily  3. Are you having a reaction (difficulty breathing--STAT)? No   4. What is your medication issue?  Pt called in stating he does not think new medication is working. He is very weak and his BP is still low. He asked if he can take 4 daily, instead of 3 daily. Please advise.   Today: 86/68  4/22:104/63  4/21: 16/10 4/20: 96/04 5/40: 981/19 1/47: 82/95 4/17: 104/60 4/16: 97/56

## 2023-05-29 ENCOUNTER — Other Ambulatory Visit (HOSPITAL_BASED_OUTPATIENT_CLINIC_OR_DEPARTMENT_OTHER): Payer: Self-pay

## 2023-05-29 MED ORDER — DROXIDOPA 100 MG PO CAPS
200.0000 mg | ORAL_CAPSULE | Freq: Three times a day (TID) | ORAL | 12 refills | Status: DC
  Filled 2023-05-29: qty 90, 15d supply, fill #0
  Filled 2023-06-25: qty 90, 15d supply, fill #1
  Filled 2023-07-11: qty 90, 15d supply, fill #2
  Filled 2023-07-23: qty 90, 15d supply, fill #3
  Filled 2023-08-12: qty 90, 15d supply, fill #4
  Filled 2023-08-28 (×2): qty 180, 30d supply, fill #5
  Filled 2023-09-17: qty 180, 30d supply, fill #6
  Filled 2023-10-31: qty 180, 30d supply, fill #7

## 2023-05-29 NOTE — Telephone Encounter (Signed)
Recommendations reviewed with pt as per Dr. Krasowski's note.  Pt verbalized understanding and had no additional questions.  

## 2023-05-30 ENCOUNTER — Other Ambulatory Visit (HOSPITAL_BASED_OUTPATIENT_CLINIC_OR_DEPARTMENT_OTHER): Payer: Self-pay

## 2023-05-30 DIAGNOSIS — G542 Cervical root disorders, not elsewhere classified: Secondary | ICD-10-CM

## 2023-05-30 HISTORY — DX: Cervical root disorders, not elsewhere classified: G54.2

## 2023-06-02 ENCOUNTER — Ambulatory Visit: Attending: Cardiology | Admitting: Cardiology

## 2023-06-06 ENCOUNTER — Ambulatory Visit: Admitting: Cardiology

## 2023-06-09 DIAGNOSIS — Z794 Long term (current) use of insulin: Secondary | ICD-10-CM | POA: Diagnosis not present

## 2023-06-09 DIAGNOSIS — E1065 Type 1 diabetes mellitus with hyperglycemia: Secondary | ICD-10-CM | POA: Diagnosis not present

## 2023-06-11 DIAGNOSIS — E1065 Type 1 diabetes mellitus with hyperglycemia: Secondary | ICD-10-CM | POA: Diagnosis not present

## 2023-06-20 DIAGNOSIS — H47233 Glaucomatous optic atrophy, bilateral: Secondary | ICD-10-CM | POA: Diagnosis not present

## 2023-06-25 ENCOUNTER — Other Ambulatory Visit (HOSPITAL_BASED_OUTPATIENT_CLINIC_OR_DEPARTMENT_OTHER): Payer: Self-pay

## 2023-07-09 DIAGNOSIS — E1022 Type 1 diabetes mellitus with diabetic chronic kidney disease: Secondary | ICD-10-CM | POA: Diagnosis not present

## 2023-07-09 DIAGNOSIS — N2581 Secondary hyperparathyroidism of renal origin: Secondary | ICD-10-CM | POA: Diagnosis not present

## 2023-07-09 DIAGNOSIS — I951 Orthostatic hypotension: Secondary | ICD-10-CM | POA: Diagnosis not present

## 2023-07-09 DIAGNOSIS — N1832 Chronic kidney disease, stage 3b: Secondary | ICD-10-CM | POA: Diagnosis not present

## 2023-07-09 DIAGNOSIS — Z794 Long term (current) use of insulin: Secondary | ICD-10-CM | POA: Diagnosis not present

## 2023-07-09 DIAGNOSIS — E1065 Type 1 diabetes mellitus with hyperglycemia: Secondary | ICD-10-CM | POA: Diagnosis not present

## 2023-07-11 ENCOUNTER — Other Ambulatory Visit (HOSPITAL_BASED_OUTPATIENT_CLINIC_OR_DEPARTMENT_OTHER): Payer: Self-pay

## 2023-07-16 ENCOUNTER — Ambulatory Visit (HOSPITAL_BASED_OUTPATIENT_CLINIC_OR_DEPARTMENT_OTHER): Admitting: Student

## 2023-07-23 ENCOUNTER — Other Ambulatory Visit (HOSPITAL_BASED_OUTPATIENT_CLINIC_OR_DEPARTMENT_OTHER): Payer: Self-pay

## 2023-08-08 DIAGNOSIS — Z794 Long term (current) use of insulin: Secondary | ICD-10-CM | POA: Diagnosis not present

## 2023-08-08 DIAGNOSIS — E1065 Type 1 diabetes mellitus with hyperglycemia: Secondary | ICD-10-CM | POA: Diagnosis not present

## 2023-08-12 ENCOUNTER — Other Ambulatory Visit (HOSPITAL_BASED_OUTPATIENT_CLINIC_OR_DEPARTMENT_OTHER): Payer: Self-pay

## 2023-08-14 ENCOUNTER — Other Ambulatory Visit (HOSPITAL_BASED_OUTPATIENT_CLINIC_OR_DEPARTMENT_OTHER): Payer: Self-pay

## 2023-08-28 ENCOUNTER — Other Ambulatory Visit: Payer: Self-pay

## 2023-08-28 ENCOUNTER — Other Ambulatory Visit (HOSPITAL_BASED_OUTPATIENT_CLINIC_OR_DEPARTMENT_OTHER): Payer: Self-pay

## 2023-08-28 ENCOUNTER — Telehealth: Payer: Self-pay | Admitting: Cardiology

## 2023-08-28 NOTE — Telephone Encounter (Signed)
 Pt c/o BP issue: STAT if pt c/o blurred vision, one-sided weakness or slurred speech.  STAT if BP is GREATER than 180/120 TODAY.  STAT if BP is LESS than 90/60 and SYMPTOMATIC TODAY  1. What is your BP concern? low  2. Have you taken any BP medication today?yes  3. What are your last 5 BP readings?88/55  4. Are you having any other symptoms (ex. Dizziness, headache, blurred vision, passed out)? fatigue

## 2023-08-28 NOTE — Telephone Encounter (Signed)
 Called the patient and he reported that his systolic blood pressure had been running less than 100 for the past week in the morning and throughout the day.. Earlier this morning his blood pressure was 88/40. He took it while he was on the phone with me and it was 123/71. He also reported that he has been feeling very tired and fatigued. Patient is asking if he needs to increase his medication to help increase his blood pressure? Please advise.

## 2023-08-29 ENCOUNTER — Other Ambulatory Visit (HOSPITAL_BASED_OUTPATIENT_CLINIC_OR_DEPARTMENT_OTHER): Payer: Self-pay

## 2023-09-01 ENCOUNTER — Ambulatory Visit: Attending: Cardiology

## 2023-09-01 NOTE — Progress Notes (Signed)
   Nurse Visit   Date of Encounter: 09/01/2023 ID: Camran Keady, DOB [birth year], MRN [id number]  PCP:  Iven Lang ONEIDA DEVONNA   [facility] Providers Cardiologist:  Lamar Fitch, MD      Visit Details   VS:  BP 120/72 (BP Location: Left Arm, Patient Position: Sitting, Cuff Size: Normal)   Pulse 76   Resp 20   Wt 160 lb (72.6 kg)   SpO2 98%   BMI 27.65 kg/m  , BMI Body mass index is 27.65 kg/m.  Wt Readings from Last 3 Encounters:  09/01/23 160 lb (72.6 kg)  04/17/23 156 lb 6.4 oz (70.9 kg)  12/12/22 161 lb (73 kg)     Reason for visit: Compare his blood pressure device to our blood pressures and patient states that his blood pressures are low Performed today: Vitals, Education and Provider consulted Changes (medications, testing, etc.) : Patient instructed by the provider to drink a glass of water  when he first wakes up then take his Northera .  Length of Visit: 25 minutes    Medications Adjustments/Labs and Tests Ordered: No orders of the defined types were placed in this encounter.  No orders of the defined types were placed in this encounter.    Signed, Charlie LILLETTE Free, RN  09/01/2023 3:19 PM

## 2023-09-10 DIAGNOSIS — E1065 Type 1 diabetes mellitus with hyperglycemia: Secondary | ICD-10-CM | POA: Diagnosis not present

## 2023-09-17 ENCOUNTER — Other Ambulatory Visit (HOSPITAL_BASED_OUTPATIENT_CLINIC_OR_DEPARTMENT_OTHER): Payer: Self-pay

## 2023-09-17 DIAGNOSIS — Z8639 Personal history of other endocrine, nutritional and metabolic disease: Secondary | ICD-10-CM | POA: Diagnosis not present

## 2023-09-17 DIAGNOSIS — E1065 Type 1 diabetes mellitus with hyperglycemia: Secondary | ICD-10-CM | POA: Diagnosis not present

## 2023-09-17 DIAGNOSIS — E785 Hyperlipidemia, unspecified: Secondary | ICD-10-CM | POA: Diagnosis not present

## 2023-09-26 DIAGNOSIS — Z794 Long term (current) use of insulin: Secondary | ICD-10-CM | POA: Diagnosis not present

## 2023-09-26 DIAGNOSIS — Z8639 Personal history of other endocrine, nutritional and metabolic disease: Secondary | ICD-10-CM | POA: Diagnosis not present

## 2023-09-26 DIAGNOSIS — E1065 Type 1 diabetes mellitus with hyperglycemia: Secondary | ICD-10-CM | POA: Diagnosis not present

## 2023-10-10 ENCOUNTER — Telehealth: Payer: Self-pay

## 2023-10-10 DIAGNOSIS — M5412 Radiculopathy, cervical region: Secondary | ICD-10-CM | POA: Diagnosis not present

## 2023-10-10 DIAGNOSIS — Z6827 Body mass index (BMI) 27.0-27.9, adult: Secondary | ICD-10-CM | POA: Diagnosis not present

## 2023-10-10 DIAGNOSIS — G959 Disease of spinal cord, unspecified: Secondary | ICD-10-CM | POA: Diagnosis not present

## 2023-10-10 NOTE — Telephone Encounter (Signed)
   Name: Jonathan Holder  DOB: Nov 11, 1955  MRN: [id number]  Primary Cardiologist: Lamar Fitch, MD   Preoperative team, please contact this patient and set up a phone call appointment for further preoperative risk assessment. Please obtain consent and complete medication review. Thank you for your help.  I confirm that guidance regarding antiplatelet and oral anticoagulation therapy has been completed and, if necessary, noted below.  Per office protocol, if patient is without any new symptoms or concerns at the time of their virtual visit, he may hold Plavix  for 5 days prior to procedure. Please resume Plavix  as soon as possible postprocedure, at the discretion of the surgeon.    I also confirmed the patient resides in the state of [place] . As per [facility] telemedicine laws, the patient must reside in the state in which the provider is licensed.   Lamarr Satterfield, NP 10/10/2023, 1:46 PM [facility]

## 2023-10-10 NOTE — Telephone Encounter (Signed)
 Med Rec and Consent done     Patient Consent for Virtual Visit        Jonathan Holder has provided verbal consent on 10/10/2023 for a virtual visit (video or telephone).   CONSENT FOR VIRTUAL VISIT FOR:  Jonathan Holder  By participating in this virtual visit I agree to the following:  I hereby voluntarily request, consent and authorize [facility] and its employed or contracted physicians, physician assistants, nurse practitioners or other licensed health care professionals (the Practitioner), to provide me with telemedicine health care services (the "Services) as deemed necessary by the treating Practitioner. I acknowledge and consent to receive the Services by the Practitioner via telemedicine. I understand that the telemedicine visit will involve communicating with the Practitioner through live audiovisual communication technology and the disclosure of certain medical information by electronic transmission. I acknowledge that I have been given the opportunity to request an in-person assessment or other available alternative prior to the telemedicine visit and am voluntarily participating in the telemedicine visit.  I understand that I have the right to withhold or withdraw my consent to the use of telemedicine in the course of my care at any time, without affecting my right to future care or treatment, and that the Practitioner or I may terminate the telemedicine visit at any time. I understand that I have the right to inspect all information obtained and/or recorded in the course of the telemedicine visit and may receive copies of available information for a reasonable fee.  I understand that some of the potential risks of receiving the Services via telemedicine include:  Delay or interruption in medical evaluation due to technological equipment failure or disruption; Information transmitted may not be sufficient (e.g. poor resolution of images) to allow for appropriate medical decision  making by the Practitioner; and/or  In rare instances, security protocols could fail, causing a breach of personal health information.  Furthermore, I acknowledge that it is my responsibility to provide information about my medical history, conditions and care that is complete and accurate to the best of my ability. I acknowledge that Practitioner's advice, recommendations, and/or decision may be based on factors not within their control, such as incomplete or inaccurate data provided by me or distortions of diagnostic images or specimens that may result from electronic transmissions. I understand that the practice of medicine is not an exact science and that Practitioner makes no warranties or guarantees regarding treatment outcomes. I acknowledge that a copy of this consent can be made available to me via my patient portal [facility] MyChart), or I can request a printed copy by calling the office of [facility].    I understand that my insurance will be billed for this visit.   I have read or had this consent read to me. I understand the contents of this consent, which adequately explains the benefits and risks of the Services being provided via telemedicine.  I have been provided ample opportunity to ask questions regarding this consent and the Services and have had my questions answered to my satisfaction. I give my informed consent for the services to be provided through the use of telemedicine in my medical care

## 2023-10-10 NOTE — Telephone Encounter (Signed)
 S/W pt and scheduled TELE preop appt 10/22/23. Med Rec and Consent done   Will update the surgeons office.

## 2023-10-10 NOTE — Telephone Encounter (Signed)
   Pre-operative Risk Assessment    Patient Name: Jonathan Holder  DOB: 06-24-1955 MRN: [id number]   Date of last office visit: 12/12/2022 Date of next office visit: N/A   Request for Surgical Clearance    Procedure:  Cervical Fusion  Date of Surgery:  Clearance TBD                                Surgeon:  Dr. Dorn Glade Surgeon's Group or Practice Name:  [facility] Phone number:  [phone] Fax number:  [phone] Attn Nikki   Type of Clearance Requested:   - Medical    Type of Anesthesia:  General    Additional requests/questions:    [name]   10/10/2023, 1:38 PM

## 2023-10-22 ENCOUNTER — Ambulatory Visit

## 2023-10-22 DIAGNOSIS — G959 Disease of spinal cord, unspecified: Secondary | ICD-10-CM | POA: Diagnosis not present

## 2023-10-22 NOTE — Telephone Encounter (Signed)
   Name: Jonathan Holder  DOB: December 12, 1955  MRN: [id number]  Primary Cardiologist: Lamar Fitch, MD  Chart reviewed as part of pre-operative protocol coverage. Because of [name] past medical history and time since last visit, he will require a follow-up in-office visit in order to better assess preoperative cardiovascular risk.  Patient was last seen in office on 12/12/2022, found to have significantly low blood pressure and was sent to the emergency department.  On chart review patient has not been seen in office following this.  Patient will require an office visit in order for preoperative cardiac evaluation to be addressed.  Pre-op covering staff: - Please schedule appointment and call patient to inform them. If patient already had an upcoming appointment within acceptable timeframe, please add pre-op clearance to the appointment notes so provider is aware. - Please contact requesting surgeon's office via preferred method (i.e, phone, fax) to inform them of need for appointment prior to surgery.   Cato Liburd D Yalena Colon, NP  10/22/2023, 9:51 AM

## 2023-10-22 NOTE — Telephone Encounter (Signed)
 Per Katlyn West, NP pt needs IN OFFICE appt for preop clearance.  S/W patient and scheduled IN OFFICE preop appt for 10/28/23 with Delon Hoover, NP  Will update the surgeons office.

## 2023-10-22 NOTE — Progress Notes (Deleted)
 Virtual Visit via Telephone Note   Because of Mung Rinker co-morbid illnesses, he is at least at moderate risk for complications without adequate follow up.  This format is felt to be most appropriate for this patient at this time.  Due to technical limitations with video connection (technology), today's appointment will be conducted as an audio only telehealth visit, and Jonathan Holder verbally agreed to proceed in this manner.   All issues noted in this document were discussed and addressed.  No physical exam could be performed with this format.  Evaluation Performed:  Preoperative cardiovascular risk assessment _____________   Date:  10/22/2023   Patient ID:  Jonathan Holder, DOB 1955/02/24, MRN [id number] Patient Location:  Home Provider location:   Office  Primary Care Provider:  Iven Lang DASEN, PA-C Primary Cardiologist:  Lamar Fitch, MD  Chief Complaint / Patient Profile   68 y.o. y/o male with a h/o *** who is pending cervical fusion and presents today for telephonic preoperative cardiovascular risk assessment.  History of Present Illness    Jonathan Holder is a 68 y.o. male who presents via audio/video conferencing for a telehealth visit today.  Pt was last seen in cardiology clinic on *** by ***.  At that time Jonathan Holder was doing well ***.  The patient is now pending procedure as outlined above. Since his last visit, he ***has remained stable from cardiac standpoint.  *** denies chest pain, shortness of breath, lower extremity edema, fatigue, palpitations, melena, hematuria, hemoptysis, diaphoresis, weakness, presyncope, syncope, orthopnea, and PND.   Past Medical History    Past Medical History:  Diagnosis Date   Anxiety    Arteriosclerosis of coronary artery bypass graft 09/18/2020   Arthritis    Carotid artery disease (HCC) 2021   Mild, bilateral   Chronic kidney disease, stage 3a (HCC) 02/12/2022   Depression    Diabetes mellitus without complication (HCC)     Diabetic polyneuropathy (HCC) 09/18/2020   Essential hypertension 08/20/2019   History of endocrine disorder 09/18/2020   History of kidney stones    HNP (herniated nucleus pulposus) with myelopathy, cervical 07/31/2016   Hyperglycemia due to type 1 diabetes mellitus (HCC) 09/18/2020   Hyperlipidemia associated with type 2 diabetes mellitus (HCC) 08/20/2019   Hyperlipidemia, unspecified 09/18/2020   Hypertension    Ischemic cardiomyopathy 09/19/2020   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 from [facility] 08/20/2019   Long term (current) use of insulin  (HCC) 09/18/2020   Multi-vessel coronary artery stenosis 08/20/2019   Orthostatic hypotension 05/21/2021   Presence of insulin  pump (external) (internal) 09/18/2020   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/24/2019   Subsequent ST elevation (STEMI) myocardial infarction of anterior wall within 4 weeks of initial infarction (HCC) 08/20/2019   Type 2 diabetes mellitus with complication, with long-term current use of insulin  (HCC) 08/20/2019   Past Surgical History:  Procedure Laterality Date   ANTERIOR CERVICAL DECOMP/DISCECTOMY FUSION N/A 07/31/2016   Procedure: ANTERIOR CERVICAL DECOMPRESSION/DISCECTOMY FUSION CERVICAL FOUR- CERVICAL FIVE, CERVICAL FIVE- CERVICAL SIX, CERVICAL SIX, CERVICAL SEVEN;  Surgeon: Alix Lamar, MD;  Location: [facility] OR;  Service: Neurosurgery;  Laterality: N/A;   COLONOSCOPY  04/13/2019   Dr. Larene, [facility]. Normal Colonoscopy to the terminal ileum but with a poor prep.   CORONARY ARTERY BYPASS GRAFT N/A 08/24/2019   Procedure: CORONARY ARTERY BYPASS GRAFTING (CABG), ON PUMP, TIMES FIVE, USING BILATERAL INTERNAL MAMMARY ARTERIES, ENDOSCOPICALLY HARVESTED RIGHT GREATER SAPHENOUS VEIN, AND OPEN LEFT RADIAL ARTERY HARVESTING;  Surgeon: German Mallard  Z, MD;  Location: [facility] OR;  Service: Open Heart Surgery;  Laterality: N/A;   DG THUMB RIGHT HAND ([facility] HX) Left    plate    ESOPHAGOGASTRODUODENOSCOPY  04/13/2019   Dr. Larene, [facility]. Mild Scattered gastritis. Small amount of food in the fundus   EYE SURGERY Bilateral    cataract removed   LEFT HEART CATH AND CORONARY ANGIOGRAPHY N/A 08/20/2019   Procedure: LEFT HEART CATH AND CORONARY ANGIOGRAPHY;  Surgeon: Anner Alm ORN, MD;  Location: [facility] INVASIVE CV LAB;  Service: Cardiovascular;  Laterality: N/A;   PENILE PROSTHESIS IMPLANT     RADIAL ARTERY HARVEST Left 08/24/2019   Procedure: RADIAL ARTERY HARVEST;  Surgeon: German Bartlett PEDLAR, MD;  Location: [facility] OR;  Service: Open Heart Surgery;  Laterality: Left;   TEE WITHOUT CARDIOVERSION N/A 08/24/2019   Procedure: TRANSESOPHAGEAL ECHOCARDIOGRAM (TEE);  Surgeon: German Bartlett PEDLAR, MD;  Location: [facility] OR;  Service: Open Heart Surgery;  Laterality: N/A;    Allergies  No Known Allergies  Home Medications    Prior to Admission medications   Medication Sig Start Date End Date Taking? Authorizing Provider  aspirin  EC 81 MG EC tablet Take 1 tablet (81 mg total) by mouth daily. Swallow whole. 08/30/19   Gold, Wayne E, PA-C  atorvastatin  (LIPITOR ) 80 MG tablet Take 1 tablet (80 mg total) by mouth daily. 11/28/22 10/10/23  Krasowski, Robert J, MD  clopidogrel  (PLAVIX ) 75 MG tablet TAKE 1 TABLET(75 MG) BY MOUTH DAILY Patient taking differently: Take 75 mg by mouth daily. 02/20/23   Krasowski, Robert J, MD  Continuous Glucose Sensor MISC 1 each by Does not apply route daily.    [provider]  desvenlafaxine  (PRISTIQ ) 100 MG 24 hr tablet Take 100 mg by mouth daily.    [provider]  droxidopa  (NORTHERA ) 100 MG CAPS Take 2 capsules (200 mg total) by mouth 3 (three) times daily with meals. 05/29/23   Krasowski, Robert J, MD  famotidine  (PEPCID ) 20 MG tablet Take 20 mg by mouth daily.    [provider]  gabapentin  (NEURONTIN ) 300 MG capsule Take 300 mg by mouth daily. And 300 mg at night    [provider]  HUMALOG 100 UNIT/ML  injection Inject 1-25 Units into the skin as directed. According to sliding scale 05/25/22   [provider]  QUEtiapine (SEROQUEL) 300 MG tablet Take 200 mg by mouth at bedtime. 02/21/21   [provider]  sodium chloride  1 g tablet Take 1 g by mouth daily. 03/22/23   [provider]  VELTASSA 8.4 g packet Take 1 packet by mouth daily. 01/20/23   [provider]    Physical Exam  Vital Signs:  Jonathan Holder does not have vital signs available for review today. Given telephonic nature of communication, physical exam is limited. AAOx3. NAD. Normal affect.  Speech and respirations are unlabored. Accessory Clinical Findings  None Assessment & Plan    1.  Preoperative Cardiovascular Risk Assessment: {Select to add RCRI Risk (<1%=LOW; >/=1%=HIGH) (Optional):21036017}  {Select if HIGH (RCRI >/=1%) Risk (Optional):21036030} Recommendations: {2014 ACC/AHA Perioperative Guidelines  :21036001} Antiplatelet and/or Anticoagulation Recommendations: {Antiplatelet Recommendations                  :21036016} {Anticoagulation Recommendations           :78963980}   The patient was advised that if he develops new symptoms prior to surgery to contact our office to arrange for a follow-up visit, and he verbalized understanding.  A copy of this note will be routed to requesting surgeon.  Time:   Today, I have spent *** minutes with the patient with telehealth technology discussing medical history, symptoms, and management plan.     Salisha Bardsley D Eliud Polo, NP  10/22/2023, 8:23 AM

## 2023-10-24 NOTE — Progress Notes (Signed)
 " Cardiology Office Note:  .   Date:  10/28/2023  ID:  Nimrod Wendt, DOB 1955-09-17, MRN [id number] PCP: Iven Lang ONEIDA RIGGERS  [facility] Providers Cardiologist:  Lamar Fitch, MD    History of Present Illness: .   Ola Fawver is a 68 y.o. male with a past medical history of hypertension, type I DM, CAD s/p CABG x 5 in 2021, ischemic cardiomyopathy, CKD stage III A, HLD.   01/10/2023 echo EF 55 to 60% apical hypokinesis noted, grade 1 DD 02/22/2022 echo 45 to 50%, grade 2 DD, mild MR, aortic valve sclerosis is present without stenosis 08/24/2019 CABG x 5 08/20/2019 left heart cath severe multivessel CAD   Mr. Hamza initially established care with Dr. Fitch in 2021 for the evaluation and management of his CAD, he was s/p CABG x 5 in July of that same year.  In 2024, repeat echocardiogram revealed HFmrEF.  Most recently evaluated by Dr. Fitch on 12/12/2022, concerned with fatigue and weakness, his blood pressure was low and suggested that he go to the emergency department but was not amenable to going via EMS so he was advised to present to the emergency department for further evaluation.  He presents today for preoperative evaluation, is overall doing stable, has been dealing with neck pain, plans to undergo cervical fusion.  He follows with endocrinology regarding his diabetes, most recent A1c was elevated at 7.8%. He denies chest pain, palpitations, dyspnea, pnd, orthopnea, n, v, dizziness, syncope, edema, weight gain, or early satiety.    ROS: Review of Systems  Constitutional: Negative.   HENT: Negative.    Eyes: Negative.   Respiratory: Negative.    Cardiovascular: Negative.   Gastrointestinal: Negative.   Musculoskeletal: Negative.   Skin: Negative.   Neurological:  Positive for dizziness.  Endo/Heme/Allergies: Negative.   Psychiatric/Behavioral: Negative.       Studies Reviewed: SABRA   EKG Interpretation Date/Time:  Tuesday October 28 2023 14:25:21  EDT Ventricular Rate:  77 PR Interval:  150 QRS Duration:  72 QT Interval:  344 QTC Calculation: 389 R Axis:   -54  Text Interpretation: Normal sinus rhythm Low voltage QRS Left anterior fascicular block Cannot rule out Inferior infarct (cited on or before 24-Aug-2019) Cannot rule out Anteroseptal infarct (cited on or before 21-Aug-2019) When compared with ECG of 12-Dec-2022 10:13, No significant change was found Confirmed by Carlin Nest [phone]) on 10/28/2023 2:29:42 PM    Cardiac Studies & Procedures   ______________________________________________________________________________________________ CARDIAC CATHETERIZATION  CARDIAC CATHETERIZATION 08/20/2019  Conclusion  Mid LAD lesion is 95% stenosed.  Prox Cx lesion is 85% stenosed.  Ramus-1 lesion is 70% stenosed.  Ramus-2 lesion is 80% stenosed.  Prox Cx to Mid Cx lesion is 70% stenosed.  Ramus-3 lesion is 90% stenosed.  Prox RCA-1 lesion is 30% stenosed.  Prox RCA-2 lesion is 30% stenosed.  Mid RCA lesion is 70% stenosed.  RPDA lesion is 70% stenosed.  The left ventricular ejection fraction is 25-35% by visual estimate.  LV end diastolic pressure is mildly elevated.  There is moderate to severe left ventricular systolic dysfunction.  There is no aortic valve stenosis.  Findings Coronary Findings Diagnostic  Dominance: Right  Left Anterior Descending There is mild diffuse disease throughout the vessel. Mid LAD lesion is 95% stenosed. Vessel is the culprit lesion. The lesion is located proximal to the major branch, focal, discrete and concentric.  First Diagonal Branch Vessel is small in size. There is moderate disease in the vessel.  First  Septal Branch Major Septal trunk - several branches  Second Septal Branch Vessel is small in size.  Third Diagonal Branch Vessel is small in size.  Ramus Intermedius Vessel is large. There is moderate diffuse disease throughout the vessel. Ramus-1 lesion is 70%  stenosed. The lesion is segmental, eccentric and irregular. Ramus-2 lesion is 80% stenosed. The lesion is segmental, eccentric and irregular. Ramus-3 lesion is 90% stenosed. The lesion is segmental and eccentric.  Left Circumflex Vessel is small. There is mild diffuse disease throughout the vessel. Prox Cx lesion is 85% stenosed. The lesion is located at the bend, focal and discrete. Prox Cx to Mid Cx lesion is 70% stenosed. The lesion is segmental and concentric.  First Obtuse Marginal Branch Vessel is small in size.  Left Posterior Atrioventricular Artery Vessel is small in size.  Right Coronary Artery There is mild diffuse disease throughout the vessel. Prox RCA-1 lesion is 30% stenosed. Prox RCA-2 lesion is 30% stenosed. Mid RCA lesion is 70% stenosed. The lesion is focal and concentric.  Acute Marginal Branch Vessel is small in size.  Right Ventricular Branch Vessel is small in size.  Right Posterior Descending Artery Vessel is small in size. RPDA lesion is 70% stenosed. The lesion is focal and concentric.  First Right Posterolateral Branch Vessel is small in size.  Intervention  No interventions have been documented.     ECHOCARDIOGRAM  ECHOCARDIOGRAM COMPLETE 01/10/2023  Narrative ECHOCARDIOGRAM REPORT    Patient Name:   KREIG PARSON Date of Exam: 01/10/2023 Medical Rec #:  [id number]    Height:       64.0 in Accession #:    [id number]   Weight:       161.0 lb Date of Birth:  06/19/1955     BSA:          1.784 m Patient Age:    67 years     BP:           163/74 mmHg Patient Gender: M            HR:           76 bpm. Exam Location:  [facility]  Procedure: 2D Echo, Intracardiac Opacification Agent, Cardiac Doppler and Color Doppler  Indications:    Dyspnea on exertion [R06.09 (ICD-10-CM)]  History:        Patient has prior history of Echocardiogram examinations, most recent 02/22/2022. Cardiomyopathy, Prior CABG, Signs/Symptoms:Fatigue; Risk  Factors:Hypertension, Dyslipidemia and Diabetes.  Sonographer:    Charlie Jointer RDCS Referring Phys: [id number] ROBERT J KRASOWSKI  IMPRESSIONS   1. Left ventricular ejection fraction, by estimation, is 55 to 60%. The left ventricle has normal function. Apical cap hykinesis noted. Left ventricular diastolic parameters are consistent with Grade I diastolic dysfunction (impaired relaxation). 2. Right ventricular systolic function is normal. The right ventricular size is normal. 3. The mitral valve is normal in structure. No evidence of mitral valve regurgitation. No evidence of mitral stenosis. 4. The aortic valve is normal in structure. Aortic valve regurgitation is not visualized. No aortic stenosis is present. 5. The inferior vena cava is normal in size with greater than 50% respiratory variability, suggesting right atrial pressure of 3 mmHg.  FINDINGS Left Ventricle: Left ventricular ejection fraction, by estimation, is 55 to 60%. The left ventricle has normal function. The left ventricle has no regional wall motion abnormalities. Definity  contrast agent was given IV to delineate the left ventricular endocardial borders. The left ventricular internal cavity size was normal in size. There is  no left ventricular hypertrophy. Left ventricular diastolic parameters are consistent with Grade I diastolic dysfunction (impaired relaxation).  Right Ventricle: The right ventricular size is normal. No increase in right ventricular wall thickness. Right ventricular systolic function is normal.  Left Atrium: Left atrial size was normal in size.  Right Atrium: Right atrial size was normal in size.  Pericardium: There is no evidence of pericardial effusion.  Mitral Valve: The mitral valve is normal in structure. No evidence of mitral valve regurgitation. No evidence of mitral valve stenosis.  Tricuspid Valve: The tricuspid valve is normal in structure. Tricuspid valve regurgitation is not demonstrated.  No evidence of tricuspid stenosis.  Aortic Valve: The aortic valve is normal in structure. Aortic valve regurgitation is not visualized. No aortic stenosis is present.  Pulmonic Valve: The pulmonic valve was normal in structure. Pulmonic valve regurgitation is not visualized. No evidence of pulmonic stenosis.  Aorta: The aortic root is normal in size and structure.  Venous: The inferior vena cava is normal in size with greater than 50% respiratory variability, suggesting right atrial pressure of 3 mmHg.  IAS/Shunts: No atrial level shunt detected by color flow Doppler.   LEFT VENTRICLE PLAX 2D LVIDd:         4.60 cm   Diastology LVIDs:         3.50 cm   LV e' medial:    3.92 cm/s LV PW:         1.00 cm   LV E/e' medial:  17.6 LV IVS:        1.10 cm   LV e' lateral:   8.59 cm/s LVOT diam:     1.90 cm   LV E/e' lateral: 8.0 LV SV:         55 LV SV Index:   31 LVOT Area:     2.84 cm   RIGHT VENTRICLE            IVC RV Basal diam:  3.10 cm    IVC diam: 1.80 cm RV Mid diam:    3.10 cm RV S prime:     8.49 cm/s TAPSE (M-mode): 1.2 cm  LEFT ATRIUM             Index        RIGHT ATRIUM          Index LA diam:        2.90 cm 1.63 cm/m   RA Area:     9.33 cm LA Vol (A2C):   44.7 ml 25.05 ml/m  RA Volume:   14.10 ml 7.90 ml/m LA Vol (A4C):   30.6 ml 17.15 ml/m LA Biplane Vol: 37.5 ml 21.02 ml/m AORTIC VALVE LVOT Vmax:   91.20 cm/s LVOT Vmean:  58.950 cm/s LVOT VTI:    0.193 m  AORTA Ao Root diam: 3.20 cm Ao Asc diam:  2.90 cm  MITRAL VALVE MV Area (PHT): 3.65 cm    SHUNTS MV Decel Time: 208 msec    Systemic VTI:  0.19 m MV E velocity: 69.03 cm/s  Systemic Diam: 1.90 cm MV A velocity: 89.97 cm/s MV E/A ratio:  0.77  Lyzette Reinhardt Crape MD Electronically signed by Jacia Sickman Crape MD Signature Date/Time: 01/10/2023/3:39:51 PM    Final   TEE  ECHO INTRAOPERATIVE TEE 08/24/2019  Narrative *INTRAOPERATIVE TRANSESOPHAGEAL REPORT *    Patient Name:   KESHAV WINEGAR    Date of Exam: 08/24/2019 Medical Rec #:  [id number]      Height:  64.0 in Accession #:    [id number]     Weight:       136.9 lb Date of Birth:  [birth year]       BSA:          1.67 m Patient Age:    64 years       BP:           119/50 mmHg Patient Gender: M              HR:           52 bpm. Exam Location:  Anesthesiology  Transesophogeal exam was perform intraoperatively during surgical procedure. Patient was closely monitored under general anesthesia during the entirety of examination.  Indications:     CABG. CAD Sonographer:     Elinor Fresh RDCS (AE) Performing Phys: [id number] BROADUS Z ATKINS  Complications: No known complications during this procedure. POST-OP IMPRESSIONS Overall, there were no significant changes from pre-bypass.  PRE-OP FINDINGS Left Ventricle: The left ventricle has mild-moderately reduced systolic function, with an ejection fraction of 40-45%. The cavity size was moderately dilated. The mid papillary region towards the base of the heart has fairly normal function. There is akinesis of the apex of the heart. There is no increase in left ventricular wall thickness.  Right Ventricle: The right ventricle has normal systolic function. The cavity was normal. There is no increase in right ventricular wall thickness.  Left Atrium: Left atrial size was not assessed. The left atrial appendage is well visualized and there is no evidence of thrombus present.  Right Atrium: Right atrial size is dilated.  Interatrial Septum: No atrial level shunt detected by color flow Doppler.  Pericardium: A small pericardial effusion is present. The pericardial effusion is circumferential. There is no evidence of cardiac tamponade.  Mitral Valve: The mitral valve is normal in structure. No thickening of the mitral valve leaflet. No calcification of the mitral valve leaflet. Mitral valve regurgitation is trivial by color flow Doppler.  Tricuspid Valve: The tricuspid valve was normal  in structure. Tricuspid valve regurgitation is trivial by color flow Doppler.  Aortic Valve: The aortic valve is normal in structure. Aortic valve regurgitation was not visualized by color flow Doppler. There is no evidence of aortic valve stenosis.  Pulmonic Valve: The pulmonic valve was normal in structure. Pulmonic valve regurgitation is not visualized by color flow Doppler.   Compared to previous exam:The pericardial effusion is no longer present. +--------------+--------++ LEFT VENTRICLE         +--------------+--------++ PLAX 2D                +--------------+--------++ LVIDd:        4.93 cm  +--------------+--------++ LVIDs:        3.53 cm  +--------------+--------++ LVOT diam:    2.05 cm  +--------------+--------++ LV SV:        63 ml    +--------------+--------++ LV SV Index:  37.16    +--------------+--------++ LVOT Area:    3.30 cm +--------------+--------++                        +--------------+--------++  +------------------+---------++ LV Volumes (MOD)            +------------------+---------++ LV area d, A4C:   21.80 cm +------------------+---------++ LV area s, A4C:   14.90 cm +------------------+---------++ LV major d, A4C:  6.75 cm   +------------------+---------++ LV major s, A4C:  6.31 cm   +------------------+---------++ LV vol d, MOD  A4C:55.1 ml   +------------------+---------++ LV vol s, MOD A4C:28.8 ml   +------------------+---------++ LV SV MOD A4C:    55.1 ml   +------------------+---------++  +------------------+-----------++ AORTIC VALVE                  +------------------+-----------++ AV Area (Vmax):   2.02 cm    +------------------+-----------++ AV Area (Vmean):  2.05 cm    +------------------+-----------++ AV Area (VTI):    2.37 cm    +------------------+-----------++ AV Vmax:          138.00  cm/s +------------------+-----------++ AV Vmean:         93.000 cm/s +------------------+-----------++ AV VTI:           0.273 m     +------------------+-----------++ AV Peak Grad:     7.6 mmHg    +------------------+-----------++ AV Mean Grad:     4.0 mmHg    +------------------+-----------++ LVOT Vmax:        84.40 cm/s  +------------------+-----------++ LVOT Vmean:       57.700 cm/s +------------------+-----------++ LVOT VTI:         0.196 m     +------------------+-----------++ LVOT/AV VTI ratio:0.72        +------------------+-----------++   +--------------+-------+ SHUNTS                +--------------+-------+ Systemic VTI: 0.20 m  +--------------+-------+ Systemic Diam:2.05 cm +--------------+-------+   Elsie Needle MD Electronically signed by Elsie Needle MD Signature Date/Time: 08/24/2019/2:41:49 PM    Final        ______________________________________________________________________________________________      Risk Assessment/Calculations:             Physical Exam:   VS:  BP 122/68 (BP Location: Left Arm, Patient Position: Sitting, Cuff Size: Normal)   Pulse 78   Resp 16   Ht 5' 3 (1.6 m)   Wt 159 lb (72.1 kg)   SpO2 96%   BMI 28.17 kg/m    Wt Readings from Last 3 Encounters:  10/28/23 159 lb (72.1 kg)  09/01/23 160 lb (72.6 kg)  04/17/23 156 lb 6.4 oz (70.9 kg)    GEN: Well nourished, well developed in no acute distress NECK: No JVD; No carotid bruits CARDIAC: RRR, no murmurs, rubs, gallops RESPIRATORY:  Clear to auscultation without rales, wheezing or rhonchi  ABDOMEN: Soft, non-tender, non-distended EXTREMITIES:  No edema; No deformity   ASSESSMENT AND PLAN: .   Coronary artery disease-s/p CABG times 06/24/2019.  Continue aspirin  81 mg daily, continue Plavix  75 mg daily, continue Lipitor  40 mg daily.  He has an upcoming cervical surgery, we will arrange for repeat stress  evaluation.  Hypotension- blood pressure today is 122/68, typically much lower in the morning, wife reports it is in the 80s when he first wakes up and continues to rise throughout the day.  Increase salt supplementation to 1 g in the evening before he goes to bed.  Continue droxidopa  200 mg 3 times a day.  CKD - follows with nephrology. Careful titration of diuretic and antihypertensive.    Dyslipidemia-severe to be formally monitored by his PCP, continue Lipitor  80 mg daily.  Prefer his LDL to be less than 70.  Preoperative cardiovascular evaluation-he is able to meet greater than 4 METS of physical activity however he has not had a stress evaluation since his bypass surgery so we will arrange for a Lexiscan  as well as an echocardiogram.  If both of these are normal he can proceed with surgery at an acceptable risk.  Dispo: Lexiscan , echocardiogram, increase salt supplementation to 1 g in the evening, follow-up in 6 months with Dr. Krasowski.  Informed Consent   Shared Decision Making/Informed Consent The risks [chest pain, shortness of breath, cardiac arrhythmias, dizziness, blood pressure fluctuations, myocardial infarction, stroke/transient ischemic attack, nausea, vomiting, allergic reaction, radiation exposure, metallic taste sensation and life-threatening complications (estimated to be 1 in 10,000)], benefits (risk stratification, diagnosing coronary artery disease, treatment guidance) and alternatives of a nuclear stress test were discussed in detail with Mr. Ziemann and he agrees to proceed.     Signed, Delon JAYSON Hoover, NP  "

## 2023-10-26 DIAGNOSIS — Z794 Long term (current) use of insulin: Secondary | ICD-10-CM | POA: Diagnosis not present

## 2023-10-26 DIAGNOSIS — E1065 Type 1 diabetes mellitus with hyperglycemia: Secondary | ICD-10-CM | POA: Diagnosis not present

## 2023-10-27 DIAGNOSIS — E1342 Other specified diabetes mellitus with diabetic polyneuropathy: Secondary | ICD-10-CM | POA: Insufficient documentation

## 2023-10-28 ENCOUNTER — Ambulatory Visit: Attending: Cardiology | Admitting: Cardiology

## 2023-10-28 ENCOUNTER — Encounter: Payer: Self-pay | Admitting: Cardiology

## 2023-10-28 VITALS — BP 122/68 | HR 78 | Resp 16 | Ht 63.0 in | Wt 159.0 lb

## 2023-10-28 DIAGNOSIS — E782 Mixed hyperlipidemia: Secondary | ICD-10-CM

## 2023-10-28 DIAGNOSIS — Z951 Presence of aortocoronary bypass graft: Secondary | ICD-10-CM | POA: Diagnosis not present

## 2023-10-28 DIAGNOSIS — I2510 Atherosclerotic heart disease of native coronary artery without angina pectoris: Secondary | ICD-10-CM

## 2023-10-28 DIAGNOSIS — I951 Orthostatic hypotension: Secondary | ICD-10-CM | POA: Diagnosis not present

## 2023-10-28 DIAGNOSIS — Z01818 Encounter for other preprocedural examination: Secondary | ICD-10-CM | POA: Diagnosis not present

## 2023-10-28 DIAGNOSIS — N1831 Chronic kidney disease, stage 3a: Secondary | ICD-10-CM

## 2023-10-28 NOTE — Patient Instructions (Signed)
 Medication Instructions:  Your physician recommends that you continue on your current medications as directed. Please refer to the Current Medication list given to you today.  *If you need a refill on your cardiac medications before your next appointment, please call your pharmacy*   Lab Work: None Ordered If you have labs (blood work) drawn today and your tests are completely normal, you will receive your results only by: MyChart Message (if you have MyChart) OR A paper copy in the mail If you have any lab test that is abnormal or we need to change your treatment, we will call you to review the results.   Testing/Procedures: Your physician has requested that you have an echocardiogram. Echocardiography is a painless test that uses sound waves to create images of your heart. It provides your doctor with information about the size and shape of your heart and how well your heart's chambers and valves are working. This procedure takes approximately one hour. There are no restrictions for this procedure. Please do NOT wear cologne, perfume, aftershave, or lotions (deodorant is allowed). Please arrive 15 minutes prior to your appointment time.  Please note: We ask at that you not bring children with you during ultrasound (echo/ vascular) testing. Due to room size and safety concerns, children are not allowed in the ultrasound rooms during exams. Our front office staff cannot provide observation of children in our lobby area while testing is being conducted. An adult accompanying a patient to their appointment will only be allowed in the ultrasound room at the discretion of the ultrasound technician under special circumstances. We apologize for any inconvenience.   Your physician has requested that you have a lexiscan myoview. For further information please visit [email or web address] Please follow instruction sheet, as given.  The test will take approximately 3 to 4 hours to complete; you may bring  reading material.  If someone comes with you to your appointment, they will need to remain in the main lobby due to limited space in the testing area.    How to prepare for your Myocardial Perfusion Test: Do not eat or drink 3 hours prior to your test, except you may have water. Do not consume products containing caffeine (regular or decaffeinated) 12 hours prior to your test. (ex: coffee, chocolate, sodas, tea). Do bring a list of your current medications with you.  If not listed below, you may take your medications as normal. Do wear comfortable clothes (no dresses or overalls) and walking shoes, tennis shoes preferred (No heels or open toe shoes are allowed). Do NOT wear cologne, perfume, aftershave, or lotions (deodorant is allowed). If these instructions are not followed, your test will have to be rescheduled.     Follow-Up: At [facility], you and your health needs are our priority.  As part of our continuing mission to provide you with exceptional heart care, we have created designated Provider Care Teams.  These Care Teams include your primary Cardiologist (physician) and Advanced Practice Providers (APPs -  Physician Assistants and Nurse Practitioners) who all work together to provide you with the care you need, when you need it.  We recommend signing up for the patient portal called "MyChart".  Sign up information is provided on this After Visit Summary.  MyChart is used to connect with patients for Virtual Visits (Telemedicine).  Patients are able to view lab/test results, encounter notes, upcoming appointments, etc.  Non-urgent messages can be sent to your provider as well.   To learn more about what you  can do with MyChart, go to [email or web address].    Your next appointment:   6 month(s)  The format for your next appointment:   In Person  Provider:   Gypsy Balsam, MD    Other Instructions NA

## 2023-11-03 ENCOUNTER — Other Ambulatory Visit (HOSPITAL_BASED_OUTPATIENT_CLINIC_OR_DEPARTMENT_OTHER): Payer: Self-pay

## 2023-11-11 ENCOUNTER — Telehealth (HOSPITAL_BASED_OUTPATIENT_CLINIC_OR_DEPARTMENT_OTHER): Payer: Self-pay | Admitting: Student

## 2023-11-11 ENCOUNTER — Encounter (HOSPITAL_COMMUNITY): Payer: Self-pay | Admitting: *Deleted

## 2023-11-11 NOTE — Telephone Encounter (Signed)
 Received preop clearance for upcoming spinal surgery. Will need an appointment before getting filled out.

## 2023-11-12 ENCOUNTER — Telehealth (HOSPITAL_BASED_OUTPATIENT_CLINIC_OR_DEPARTMENT_OTHER): Payer: Self-pay | Admitting: *Deleted

## 2023-11-12 NOTE — Telephone Encounter (Signed)
 called

## 2023-11-13 ENCOUNTER — Encounter (HOSPITAL_BASED_OUTPATIENT_CLINIC_OR_DEPARTMENT_OTHER): Payer: Self-pay | Admitting: Family Medicine

## 2023-11-13 ENCOUNTER — Ambulatory Visit (INDEPENDENT_AMBULATORY_CARE_PROVIDER_SITE_OTHER): Admitting: Family Medicine

## 2023-11-13 ENCOUNTER — Other Ambulatory Visit (HOSPITAL_BASED_OUTPATIENT_CLINIC_OR_DEPARTMENT_OTHER): Payer: Self-pay

## 2023-11-13 VITALS — BP 138/48 | HR 78 | Temp 98.3°F | Resp 16 | Wt 165.5 lb

## 2023-11-13 DIAGNOSIS — I10 Essential (primary) hypertension: Secondary | ICD-10-CM | POA: Diagnosis not present

## 2023-11-13 DIAGNOSIS — E118 Type 2 diabetes mellitus with unspecified complications: Secondary | ICD-10-CM | POA: Diagnosis not present

## 2023-11-13 DIAGNOSIS — R1319 Other dysphagia: Secondary | ICD-10-CM | POA: Diagnosis not present

## 2023-11-13 DIAGNOSIS — Z794 Long term (current) use of insulin: Secondary | ICD-10-CM

## 2023-11-13 DIAGNOSIS — M5090 Cervical disc disorder, unspecified, unspecified cervical region: Secondary | ICD-10-CM | POA: Diagnosis not present

## 2023-11-13 DIAGNOSIS — R1310 Dysphagia, unspecified: Secondary | ICD-10-CM | POA: Insufficient documentation

## 2023-11-13 MED ORDER — PANTOPRAZOLE SODIUM 40 MG PO TBEC: 40.0000 mg | DELAYED_RELEASE_TABLET | Freq: Every day | ORAL | 3 refills | Status: AC

## 2023-11-13 NOTE — Progress Notes (Unsigned)
 Established Patient Office Visit  Subjective   Patient ID: Jonathan Holder, male    DOB: 30-Jul-1955  Age: 68 y.o. MRN: [id number]  Chief Complaint  Patient presents with   Follow-up    Follow-up    F/u as above.  Known to me from [facility] in recent past years.  Overall stable, but preparing for spine surgery.  His exercise capacity is pretty good.  Has been seeing my PA over the previous months.  He also reports occasional dysphagia for the past 2 weeks.  No CP or tightness.    Past Medical History:  Diagnosis Date   Anxiety    Carotid artery disease 2021   Mild, bilateral   Chronic kidney disease, stage 3a (HCC) 02/12/2022   Depression    Essential hypertension 08/20/2019   GERD (gastroesophageal reflux disease)    History of kidney stones    Hyperlipidemia, unspecified 09/18/2020   Ischemic cardiomyopathy 09/19/2020   f/by [facility]   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 from [facility] 08/20/2019   Presence of insulin  pump (external) (internal) 09/18/2020   Type 2 diabetes mellitus with complication, with long-term current use of insulin  (HCC) 08/20/2019    Outpatient Encounter Medications as of 11/13/2023  Medication Sig   aspirin  EC 81 MG EC tablet Take 1 tablet (81 mg total) by mouth daily. Swallow whole.   atorvastatin  (LIPITOR ) 80 MG tablet Take 1 tablet (80 mg total) by mouth daily.   Continuous Glucose Sensor MISC 1 each by Does not apply route daily.   desvenlafaxine  (PRISTIQ ) 100 MG 24 hr tablet Take 100 mg by mouth daily.   droxidopa  (NORTHERA ) 100 MG CAPS Take 2 capsules (200 mg total) by mouth 3 (three) times daily with meals.   famotidine  (PEPCID ) 20 MG tablet Take 20 mg by mouth daily.   gabapentin  (NEURONTIN ) 300 MG capsule Take 300 mg by mouth daily. And 300 mg at night   HUMALOG 100 UNIT/ML injection Inject 1-25 Units into the skin as directed. According to sliding scale   latanoprost (XALATAN) 0.005 % ophthalmic solution SMARTSIG:In  Eye(s)   pantoprazole  (PROTONIX ) 40 MG tablet Take 1 tablet (40 mg total) by mouth daily.   QUEtiapine (SEROQUEL) 300 MG tablet Take 200 mg by mouth at bedtime.   sodium chloride  1 g tablet Take 1 g by mouth daily.   clopidogrel  (PLAVIX ) 75 MG tablet TAKE 1 TABLET(75 MG) BY MOUTH DAILY (Patient taking differently: Take 75 mg by mouth daily.)   VELTASSA 8.4 g packet Take 1 packet by mouth daily. (Patient not taking: Reported on 11/13/2023)   No facility-administered encounter medications on file as of 11/13/2023.    Social History   Tobacco Use   Smoking status: Never    Passive exposure: Never   Smokeless tobacco: Never  Vaping Use   Vaping status: Never Used  Substance Use Topics   Alcohol use: No   Drug use: No      Review of Systems  Constitutional:  Negative for diaphoresis, fever, malaise/fatigue and weight loss.  Respiratory:  Negative for cough, shortness of breath and wheezing.   Cardiovascular:  Negative for chest pain, palpitations, orthopnea, claudication, leg swelling and PND.      Objective:     BP (!) 138/48 (BP Location: Right Arm, Patient Position: Sitting, Cuff Size: Normal)   Pulse 78   Temp 98.3 F (36.8 C) (Oral)   Resp 16   Wt 165 lb 8 oz (75.1 kg)   SpO2 92%  BMI 29.32 kg/m    Physical Exam Constitutional:      General: He is not in acute distress.    Appearance: Normal appearance.  HENT:     Head: Normocephalic.     Mouth/Throat:     Pharynx: Oropharynx is clear.  Neck:     Vascular: No carotid bruit.     Comments: Thyroid  normal to palpation Cardiovascular:     Rate and Rhythm: Normal rate and regular rhythm.     Pulses: Normal pulses.     Heart sounds: Normal heart sounds.  Pulmonary:     Effort: Pulmonary effort is normal.     Breath sounds: Normal breath sounds.  Abdominal:     General: Bowel sounds are normal.     Palpations: Abdomen is soft.  Musculoskeletal:     Cervical back: Neck supple. Tenderness present.     Right  lower leg: No edema.     Left lower leg: No edema.  Neurological:     Mental Status: He is alert.      No results found for any visits on 11/13/23.    The ASCVD Risk score (Arnett DK, et al., 2019) failed to calculate for the following reasons:   Risk score cannot be calculated because patient has a medical history suggesting prior/existing ASCVD    Assessment & Plan:  Esophageal dysphagia Assessment & Plan: Possibly due to GERD.  Handout on dietary triggers provided.  Add a PPI in the morning.  If he isn't substantially better in 1-2 weeks, I advise referral to Dr. Larene for possible EGD.  Orders: -     Pantoprazole  Sodium; Take 1 tablet (40 mg total) by mouth daily.  Dispense: 30 tablet; Refill: 3  Type 2 diabetes mellitus with complication, with long-term current use of insulin  [facility]) Assessment & Plan: F/u with Endocrine as directed.   Primary hypertension Assessment & Plan: Controlled.   Cervical disc disease Assessment & Plan: Approved for surgery from a medical standpoint.  Request recent labs and will require Cardiology preop evaluation.       Return in about 3 months (around 02/13/2024) for chronic follow-up.    REDDING PONCE NORLEEN FALCON., MD

## 2023-11-14 ENCOUNTER — Encounter (HOSPITAL_BASED_OUTPATIENT_CLINIC_OR_DEPARTMENT_OTHER): Payer: Self-pay | Admitting: Family Medicine

## 2023-11-14 DIAGNOSIS — M5090 Cervical disc disorder, unspecified, unspecified cervical region: Secondary | ICD-10-CM | POA: Insufficient documentation

## 2023-11-14 NOTE — Assessment & Plan Note (Signed)
 Possibly due to GERD.  Handout on dietary triggers provided.  Add a PPI in the morning.  If he isn't substantially better in 1-2 weeks, I advise referral to Dr. Larene for possible EGD.

## 2023-11-14 NOTE — Assessment & Plan Note (Signed)
 F/u with Endocrine as directed.

## 2023-11-14 NOTE — Assessment & Plan Note (Signed)
 Controlled.

## 2023-11-14 NOTE — Assessment & Plan Note (Addendum)
 Approved for surgery from a medical standpoint.  Request recent labs and will require Cardiology preop evaluation.

## 2023-11-21 DIAGNOSIS — H47233 Glaucomatous optic atrophy, bilateral: Secondary | ICD-10-CM | POA: Diagnosis not present

## 2023-11-21 DIAGNOSIS — E113393 Type 2 diabetes mellitus with moderate nonproliferative diabetic retinopathy without macular edema, bilateral: Secondary | ICD-10-CM | POA: Diagnosis not present

## 2023-11-26 ENCOUNTER — Ambulatory Visit: Attending: Cardiology

## 2023-11-26 ENCOUNTER — Ambulatory Visit

## 2023-11-26 DIAGNOSIS — Z01810 Encounter for preprocedural cardiovascular examination: Secondary | ICD-10-CM

## 2023-11-26 DIAGNOSIS — Z01818 Encounter for other preprocedural examination: Secondary | ICD-10-CM

## 2023-11-26 LAB — ECHOCARDIOGRAM COMPLETE
Area-P 1/2: 5.02 cm2
Height: 63 in
MV M vel: 2.37 m/s
MV Peak grad: 22.5 mmHg
S' Lateral: 3.8 cm
Weight: 2544 [oz_av]

## 2023-11-26 MED ORDER — TECHNETIUM TC 99M TETROFOSMIN IV KIT
10.7000 | PACK | Freq: Once | INTRAVENOUS | Status: AC | PRN
  Administered 2023-11-26: 10.7 via INTRAVENOUS

## 2023-11-26 MED ORDER — REGADENOSON 0.4 MG/5ML IV SOLN
0.4000 mg | Freq: Once | INTRAVENOUS | Status: AC
  Administered 2023-11-26: 0.4 mg via INTRAVENOUS

## 2023-11-26 MED ORDER — TECHNETIUM TC 99M TETROFOSMIN IV KIT
29.9000 | PACK | Freq: Once | INTRAVENOUS | Status: AC | PRN
  Administered 2023-11-26: 29.9 via INTRAVENOUS

## 2023-11-26 MED ORDER — PERFLUTREN LIPID MICROSPHERE
1.0000 mL | INTRAVENOUS | Status: AC | PRN
  Administered 2023-11-26: 10 mL via INTRAVENOUS

## 2023-11-28 ENCOUNTER — Ambulatory Visit: Payer: Self-pay | Admitting: Cardiology

## 2023-11-28 DIAGNOSIS — Z79899 Other long term (current) drug therapy: Secondary | ICD-10-CM

## 2023-11-28 DIAGNOSIS — Z961 Presence of intraocular lens: Secondary | ICD-10-CM | POA: Diagnosis not present

## 2023-11-28 DIAGNOSIS — E113553 Type 2 diabetes mellitus with stable proliferative diabetic retinopathy, bilateral: Secondary | ICD-10-CM | POA: Diagnosis not present

## 2023-11-28 DIAGNOSIS — N1831 Chronic kidney disease, stage 3a: Secondary | ICD-10-CM

## 2023-11-28 DIAGNOSIS — I2510 Atherosclerotic heart disease of native coronary artery without angina pectoris: Secondary | ICD-10-CM

## 2023-11-28 DIAGNOSIS — I10 Essential (primary) hypertension: Secondary | ICD-10-CM

## 2023-11-28 LAB — MYOCARDIAL PERFUSION IMAGING
LV dias vol: 79 mL (ref 62–150)
LV sys vol: 49 mL
Nuc Stress EF: 38 %
Peak HR: 75 {beats}/min
Rest HR: 72 {beats}/min
Rest Nuclear Isotope Dose: 10.7 mCi
SDS: 2
SRS: 16
SSS: 18
ST Depression (mm): 0 mm
Stress Nuclear Isotope Dose: 29.9 mCi
TID: 1.26

## 2023-12-03 DIAGNOSIS — L814 Other melanin hyperpigmentation: Secondary | ICD-10-CM | POA: Diagnosis not present

## 2023-12-03 DIAGNOSIS — L820 Inflamed seborrheic keratosis: Secondary | ICD-10-CM | POA: Diagnosis not present

## 2023-12-03 DIAGNOSIS — L821 Other seborrheic keratosis: Secondary | ICD-10-CM | POA: Diagnosis not present

## 2023-12-04 MED ORDER — EMPAGLIFLOZIN 10 MG PO TABS: 10.0000 mg | ORAL_TABLET | Freq: Every day | ORAL | 5 refills | Status: AC

## 2023-12-04 MED ORDER — SODIUM CHLORIDE 1 G PO TABS: 2.0000 g | ORAL_TABLET | Freq: Every day | ORAL | 5 refills | Status: AC

## 2023-12-04 NOTE — Telephone Encounter (Signed)
-----   Message from Delon JAYSON Hoover sent at 11/28/2023  4:01 PM EDT -----  Echocardiogram shows that his heart function is slightly decreased at 45 to 50%, previously was 55 to 60%, slightly stiff when it relaxes--this can occur with aging as well as longstanding high blood  pressure.    Suggest starting Jardiance 10 mg daily to see if we can improve his heart function.  Repeat BMET in 2 weeks.  Waiting for his stress evaluation to be interpreted by the cardiologist that we will let him know soon as that is interpreted.  ----- Message ----- From: Interface, [employer] Sent: 11/26/2023   5:07 PM EDT To: Delon JAYSON Hoover, NP

## 2023-12-04 NOTE — Telephone Encounter (Signed)
 Spoke with pt and informed him of the Echo results, sent in [place] and more of his Sodium Chloride  which Dr. Bernie had told him to double up on last time he was in the office. Let him know to come to office in 2 weeks for a BMP to check kidney function on this new medicine. Pt verbalized understanding and had no further questions.

## 2023-12-05 DIAGNOSIS — E103553 Type 1 diabetes mellitus with stable proliferative diabetic retinopathy, bilateral: Secondary | ICD-10-CM | POA: Diagnosis not present

## 2023-12-05 DIAGNOSIS — Z961 Presence of intraocular lens: Secondary | ICD-10-CM | POA: Diagnosis not present

## 2023-12-05 NOTE — Telephone Encounter (Signed)
 Patient wants a call back to further discuss questions he has regarding Echo test results.

## 2023-12-12 ENCOUNTER — Telehealth: Payer: Self-pay | Admitting: *Deleted

## 2023-12-12 ENCOUNTER — Telehealth: Payer: Self-pay | Admitting: Cardiology

## 2023-12-12 NOTE — Telephone Encounter (Signed)
 He had an echo and stress test for upcoming surgery.  Schedule him to come back in to see Dr. Bernie as his stress test was abnormal.    I sent this note to Dr. Bernie 12/04/2023 and have not heard back yet:   Echocardiogram shows that his heart function is slightly decreased at 45 to 50%, previously was 55 to 60%, slightly stiff when it relaxes--this can occur with aging as well as longstanding high blood pressure.     Suggest starting Jardiance 10 mg daily to see if we can improve his heart function.  Repeat BMET in 2 weeks.   Waiting for his stress evaluation to be interpreted by the cardiologist that we will let him know soon as that is interpreted.    So echo with newly decreased EF, and stress consistent with infarction, he did not have any symptoms though.   Should he come back to see you for further eval? Or do you think he is ok for cervical surgery?

## 2023-12-12 NOTE — Telephone Encounter (Signed)
   Name: Jonathan Holder  DOB: 10-06-1955  MRN: [id number]  Primary Cardiologist: Jonathan Fitch, MD  Chart reviewed as part of pre-operative protocol coverage. Because of Jonathan Holder past medical history and time since last visit, he will require a follow-up in-office visit in order to better assess preoperative cardiovascular risk (abnormal preoperative testing but patient asymptomatic), needs to be seen by Dr. Fitch to discuss further.  Pre-op covering staff: - Please schedule appointment and call patient to inform them. If patient already had an upcoming appointment within acceptable timeframe, please add pre-op clearance to the appointment notes so provider is aware. - Please contact requesting surgeon's office via preferred method (i.e, phone, fax) to inform them of need for appointment prior to surgery.    Jonathan JAYSON Hoover, NP  12/12/2023, 4:43 PM

## 2023-12-12 NOTE — Telephone Encounter (Signed)
   Pre-operative Risk Assessment    Patient Name: Jonathan Holder  DOB: April 01, 1955 MRN: [id number]      Request for Surgical Clearance    Procedure:  Cervical Fusion  Date of Surgery:  Clearance TBD                                 Surgeon:  Dr. Lasandra Glade Surgeon's Group or Practice Name:  [facility] Phone number:  [phone], ext. [phone] Fax number:  [phone] or [phone]   Type of Clearance Requested:   - Medical  - Pharmacy:  Hold Aspirin  and Clopidogrel  (Plavix ) How many days before surgery   Type of Anesthesia:  General    Additional requests/questions:    Bonney Arloa Donovan Levorn   12/12/2023, 4:23 PM

## 2023-12-15 ENCOUNTER — Encounter: Payer: Self-pay | Admitting: Cardiology

## 2023-12-15 ENCOUNTER — Ambulatory Visit: Attending: Cardiology | Admitting: Cardiology

## 2023-12-15 VITALS — BP 136/78 | HR 75 | Ht 63.0 in | Wt 164.0 lb

## 2023-12-15 DIAGNOSIS — I2510 Atherosclerotic heart disease of native coronary artery without angina pectoris: Secondary | ICD-10-CM | POA: Diagnosis not present

## 2023-12-15 DIAGNOSIS — Z951 Presence of aortocoronary bypass graft: Secondary | ICD-10-CM

## 2023-12-15 DIAGNOSIS — E1169 Type 2 diabetes mellitus with other specified complication: Secondary | ICD-10-CM

## 2023-12-15 DIAGNOSIS — Z01818 Encounter for other preprocedural examination: Secondary | ICD-10-CM

## 2023-12-15 DIAGNOSIS — E782 Mixed hyperlipidemia: Secondary | ICD-10-CM

## 2023-12-15 DIAGNOSIS — E785 Hyperlipidemia, unspecified: Secondary | ICD-10-CM

## 2023-12-15 DIAGNOSIS — G542 Cervical root disorders, not elsewhere classified: Secondary | ICD-10-CM

## 2023-12-15 DIAGNOSIS — I10 Essential (primary) hypertension: Secondary | ICD-10-CM

## 2023-12-15 MED ORDER — DROXIDOPA 200 MG PO CAPS: 200.0000 mg | ORAL_CAPSULE | Freq: Three times a day (TID) | ORAL | 3 refills | Status: AC

## 2023-12-15 NOTE — Patient Instructions (Signed)
Medication Instructions:  Your physician recommends that you continue on your current medications as directed. Please refer to the Current Medication list given to you today.  *If you need a refill on your cardiac medications before your next appointment, please call your pharmacy*   Lab Work: Lipid, AST, ALT- today If you have labs (blood work) drawn today and your tests are completely normal, you will receive your results only by: MyChart Message (if you have MyChart) OR A paper copy in the mail If you have any lab test that is abnormal or we need to change your treatment, we will call you to review the results.   Testing/Procedures: None Ordered   Follow-Up: At [facility], you and your health needs are our priority.  As part of our continuing mission to provide you with exceptional heart care, we have created designated Provider Care Teams.  These Care Teams include your primary Cardiologist (physician) and Advanced Practice Providers (APPs -  Physician Assistants and Nurse Practitioners) who all work together to provide you with the care you need, when you need it.  We recommend signing up for the patient portal called "MyChart".  Sign up information is provided on this After Visit Summary.  MyChart is used to connect with patients for Virtual Visits (Telemedicine).  Patients are able to view lab/test results, encounter notes, upcoming appointments, etc.  Non-urgent messages can be sent to your provider as well.   To learn more about what you can do with MyChart, go to [email or web address].    Your next appointment:   6 month(s)  The format for your next appointment:   In Person  Provider:   Gypsy Balsam, MD    Other Instructions NA

## 2023-12-15 NOTE — Telephone Encounter (Signed)
 S/w the pt about needing in office appt for preop clearance. Pt took 11:20 today with Dr. Krasowski.   I will update all parties involved.

## 2023-12-15 NOTE — Progress Notes (Unsigned)
 Cardiology Office Note:    Date:  12/15/2023   ID:  Jonathan Holder, DOB [birth year], MRN [id number]  PCP:  Dottie Norleen PHEBE PONCE, MD  Cardiologist:  Lamar Fitch, MD    Referring MD: Dottie Norleen PHEBE PONCE, MD   Chief Complaint  Patient presents with   Pre-op Exam    History of Present Illness:    Jonathan Holder is a 68 y.o. male  with past medical history significant for coronary artery disease in 2021 he end up coming to the hospital episode of chest pain he was transferred to [facility], cardiac catheterization has been performed which was fine 90% mid LAD 90% intermediate branch, 80% stenosis of the circumflex as well as 70 external percent stenosis of the right coronary artery he was also found to have ischemic cardiomyopathy with ejection fraction 25 to 30%. He end up having coronary bypass graft done 5 grafts after that ejection fraction improved. Additional problem include diabetes which appears to be difficult to control, depression, dyslipidemia, orthostatic hypotension.  Comes today to my office for follow-up.  Overall seems to be doing well.  He does have some issue with the neck and surgery is contemplated, surgery will be done under general esthesia.  He denies have any chest pain tightness squeezing pressure burning chest.  He says he can go and do shopping at [employer] with no difficulties.  He did have a stress test done recently which show evidence of old myocardial infarction but no ischemia, echocardiogram done recently showed mildly reduced left ventricular ejection fraction.   Past Medical History:  Diagnosis Date   Anxiety    Carotid artery disease 2021   Mild, bilateral   Chronic kidney disease, stage 3a (HCC) 02/12/2022   Depression    Essential hypertension 08/20/2019   GERD (gastroesophageal reflux disease)    History of kidney stones    Hyperlipidemia, unspecified 09/18/2020   Ischemic cardiomyopathy 09/19/2020   f/by [facility]   Left ventricular aneurysm-seen  on echocardiogram from [facility] 08/20/2019   Presence of insulin  pump (external) (internal) 09/18/2020   Type 2 diabetes mellitus with complication, with long-term current use of insulin  (HCC) 08/20/2019    Past Surgical History:  Procedure Laterality Date   ANTERIOR CERVICAL DECOMP/DISCECTOMY FUSION N/A 07/31/2016   Procedure: ANTERIOR CERVICAL DECOMPRESSION/DISCECTOMY FUSION CERVICAL FOUR- CERVICAL FIVE, CERVICAL FIVE- CERVICAL SIX, CERVICAL SIX, CERVICAL SEVEN;  Surgeon: Alix Lamar, MD;  Location: [facility] OR;  Service: Neurosurgery;  Laterality: N/A;   COLONOSCOPY  04/13/2019   Dr. Larene, [facility]. Normal Colonoscopy to the terminal ileum but with a poor prep.   CORONARY ARTERY BYPASS GRAFT N/A 08/24/2019   Procedure: CORONARY ARTERY BYPASS GRAFTING (CABG), ON PUMP, TIMES FIVE, USING BILATERAL INTERNAL MAMMARY ARTERIES, ENDOSCOPICALLY HARVESTED RIGHT GREATER SAPHENOUS VEIN, AND OPEN LEFT RADIAL ARTERY HARVESTING;  Surgeon: German Bartlett PEDLAR, MD;  Location: [facility] OR;  Service: Open Heart Surgery;  Laterality: N/A;   DG THUMB RIGHT HAND ([facility] HX) Left    plate   ESOPHAGOGASTRODUODENOSCOPY  04/13/2019   Dr. Larene, [facility]. Mild Scattered gastritis. Small amount of food in the fundus   EYE SURGERY Bilateral    cataract removed   LEFT HEART CATH AND CORONARY ANGIOGRAPHY N/A 08/20/2019   Procedure: LEFT HEART CATH AND CORONARY ANGIOGRAPHY;  Surgeon: Anner Alm ORN, MD;  Location: [facility] INVASIVE CV LAB;  Service: Cardiovascular;  Laterality: N/A;   PENILE PROSTHESIS IMPLANT     RADIAL ARTERY HARVEST Left 08/24/2019   Procedure: RADIAL  ARTERY HARVEST;  Surgeon: German Bartlett PEDLAR, MD;  Location: [facility] OR;  Service: Open Heart Surgery;  Laterality: Left;   TEE WITHOUT CARDIOVERSION N/A 08/24/2019   Procedure: TRANSESOPHAGEAL ECHOCARDIOGRAM (TEE);  Surgeon: German Bartlett PEDLAR, MD;  Location: [facility] OR;  Service: Open Heart Surgery;  Laterality: N/A;    Current  Medications: Current Meds  Medication Sig   aspirin  EC 81 MG EC tablet Take 1 tablet (81 mg total) by mouth daily. Swallow whole.   atorvastatin  (LIPITOR ) 80 MG tablet Take 1 tablet (80 mg total) by mouth daily.   clopidogrel  (PLAVIX ) 75 MG tablet TAKE 1 TABLET(75 MG) BY MOUTH DAILY (Patient taking differently: Take 75 mg by mouth daily.)   Continuous Glucose Sensor MISC 1 each by Does not apply route daily.   desvenlafaxine  (PRISTIQ ) 100 MG 24 hr tablet Take 100 mg by mouth daily.   Droxidopa  200 MG CAPS Take 200 mg by mouth 3 (three) times daily.   empagliflozin (JARDIANCE) 10 MG TABS tablet Take 1 tablet (10 mg total) by mouth daily before breakfast.   famotidine  (PEPCID ) 20 MG tablet Take 20 mg by mouth daily.   gabapentin  (NEURONTIN ) 300 MG capsule Take 300 mg by mouth daily. And 300 mg at night   HUMALOG 100 UNIT/ML injection Inject 1-25 Units into the skin as directed. According to sliding scale   latanoprost (XALATAN) 0.005 % ophthalmic solution SMARTSIG:In Eye(s)   pantoprazole  (PROTONIX ) 40 MG tablet Take 1 tablet (40 mg total) by mouth daily.   QUEtiapine (SEROQUEL) 300 MG tablet Take 200 mg by mouth at bedtime.   sodium chloride  1 g tablet Take 2 tablets (2 g total) by mouth daily.   [DISCONTINUED] droxidopa  (NORTHERA ) 100 MG CAPS Take 2 capsules (200 mg total) by mouth 3 (three) times daily with meals.     Allergies:   Patient has no known allergies.   Social History   Socioeconomic History   Marital status: Married    Spouse name: Not on file   Number of children: Not on file   Years of education: Not on file   Highest education level: Not on file  Occupational History   Occupation: production designer, theatre/television/film    Comment: [employer]  Tobacco Use   Smoking status: Never    Passive exposure: Never   Smokeless tobacco: Never  Vaping Use   Vaping status: Never Used  Substance and Sexual Activity   Alcohol use: No   Drug use: No   Sexual activity: Yes  Other Topics Concern    Not on file  Social History Narrative   1 step daughter    Works for [employer]   Social Drivers of Health   Financial Resource Strain: Not on file  Food Insecurity: No Food Insecurity (04/17/2023)   Hunger Vital Sign    Worried About Running Out of Food in the Last Year: Never true    Ran Out of Food in the Last Year: Never true  Transportation Needs: No Transportation Needs (04/17/2023)   PRAPARE - Administrator, Civil Service (Medical): No    Lack of Transportation (Non-Medical): No  Physical Activity: Not on file  Stress: Not on file  Social Connections: Not on file     Family History: The patient's family history includes Dementia in his mother; Heart disease in his father; Kidney disease in his father. ROS:   Please see the history of present illness.    All 14 point review of systems negative  except as described per history of present illness  EKGs/Labs/Other Studies Reviewed:    EKG Interpretation Date/Time:  Monday December 15 2023 11:29:51 EST Ventricular Rate:  75 PR Interval:  150 QRS Duration:  74 QT Interval:  366 QTC Calculation: 408 R Axis:   -51  Text Interpretation: Normal sinus rhythm Left anterior fascicular block Cannot rule out Inferior infarct (cited on or before 24-Aug-2019) Anterolateral infarct (cited on or before 24-Aug-2019) When compared with ECG of 28-Oct-2023 14:25, No significant change was found Confirmed by Bernie Charleston [phone]) on 12/15/2023 11:40:57 AM    Recent Labs: No results found for requested labs within last 365 days.  Recent Lipid Panel    Component Value Date/Time   CHOL 124 02/21/2022 0845   TRIG 95 02/21/2022 0845   HDL 46 02/21/2022 0845   CHOLHDL 2.7 02/21/2022 0845   CHOLHDL 2.9 08/21/2019 0757   VLDL 8 08/21/2019 0757   LDLCALC 60 02/21/2022 0845   LDLDIRECT 61 07/04/2022 1522    Physical Exam:    VS:  BP 136/78   Pulse 75   Ht 5' 3 (1.6 m)   Wt 164 lb (74.4 kg)   SpO2 97%    BMI 29.05 kg/m     Wt Readings from Last 3 Encounters:  12/15/23 164 lb (74.4 kg)  11/26/23 159 lb (72.1 kg)  11/13/23 165 lb 8 oz (75.1 kg)     GEN:  Well nourished, well developed in no acute distress HEENT: Normal NECK: No JVD; No carotid bruits LYMPHATICS: No lymphadenopathy CARDIAC: RRR, no murmurs, no rubs, no gallops RESPIRATORY:  Clear to auscultation without rales, wheezing or rhonchi  ABDOMEN: Soft, non-tender, non-distended MUSCULOSKELETAL:  No edema; No deformity  SKIN: Warm and dry LOWER EXTREMITIES: no swelling NEUROLOGIC:  Alert and oriented x 3 PSYCHIATRIC:  Normal affect   ASSESSMENT:    1. Coronary artery disease involving native coronary artery of native heart without angina pectoris   2. Essential hypertension   3. Preoperative clearance   4. 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 Cervical syndrome   6. Mixed hyperlipidemia   7. Hyperlipidemia associated with type 2 diabetes mellitus (HCC)    PLAN:    In order of problems listed above:  Cardiovascular preop evaluation for neck surgery under general anesthesia, he can do 4 METS on top of that recent stress test was negative, echocardiogram showed mildly reduced left ventricular ejection fraction, therefore, from cardiac standpoint to be set by acceptable candidate for surgery.  We need to be very careful about his hemodynamics, he does have history of orthostatic hypotension and he is very fragile in terms of his hemodynamics.  In terms of dual antiplatelets therapy and statin need to be withdrawn about week before surgery and need to be restarted as quickly as feasible from surgical point of view, it is acceptable approach obviously there are some risk of this approach but there is not a way around it. Essential hypertension blood pressure actually well-controlled and we dealing usually with opposite problem orthostatic hypotension.  Will continue with present regimen which seems to be keeping him  stable. Dyslipidemia I did review [facility] which show me his data from 2024 to June with LDL of 88 HDL 65 we will continue present management. Type 2 diabetes followed by antimedicine team last hemoglobin A1c I have from 09/10/2023 which is 7.8.  He does have insulin  pump and his diabetes now is decently controlled   Medication Adjustments/Labs and Tests  Ordered: Current medicines are reviewed at length with the patient today.  Concerns regarding medicines are outlined above.  Orders Placed This Encounter  Procedures   EKG 12-Lead   Medication changes:  Meds ordered this encounter  Medications   Droxidopa  200 MG CAPS    Sig: Take 200 mg by mouth 3 (three) times daily.    Dispense:  270 capsule    Refill:  3    Signed, Lamar DOROTHA Fitch, MD, [facility] 12/15/2023 11:52 AM    [facility]

## 2023-12-16 LAB — LIPID PANEL
Chol/HDL Ratio: 2.3 ratio (ref 0.0–5.0)
Cholesterol, Total: 126 mg/dL (ref 100–199)
HDL: 55 mg/dL (ref >39)
LDL Chol Calc (NIH): 62 mg/dL (ref 0–99)
Triglycerides: 34 mg/dL (ref 0–149)
VLDL Cholesterol Cal: 9 mg/dL (ref 5–40)

## 2023-12-16 LAB — ALT: ALT: 12 IU/L (ref 0–44)

## 2023-12-16 LAB — AST: AST: 25 IU/L (ref 0–40)

## 2023-12-17 ENCOUNTER — Ambulatory Visit (HOSPITAL_BASED_OUTPATIENT_CLINIC_OR_DEPARTMENT_OTHER)

## 2023-12-17 DIAGNOSIS — K080 Exfoliation of teeth due to systemic causes: Secondary | ICD-10-CM | POA: Diagnosis not present

## 2023-12-19 ENCOUNTER — Other Ambulatory Visit (HOSPITAL_COMMUNITY): Payer: Self-pay

## 2023-12-19 ENCOUNTER — Telehealth: Payer: Self-pay | Admitting: Pharmacy Technician

## 2023-12-19 NOTE — Telephone Encounter (Signed)
   Lf 12/04/23 too soon until 12/27/23

## 2023-12-24 ENCOUNTER — Ambulatory Visit: Payer: Self-pay | Admitting: Cardiology

## 2023-12-29 ENCOUNTER — Telehealth: Payer: Self-pay | Admitting: Pharmacist

## 2023-12-29 NOTE — Progress Notes (Signed)
 Pharmacy Quality Measure Review  This patient is appearing on the insurance-providing list for being at risk of failing the adherence measure for Statin Use in Persons with Diabetes (SUPD) medications this calendar year.   Appears patient has filled since the report was generated. Prescription is now expired, will send message to cardiologist.   [facility], PharmD, [facility] Clinical Pharmacist [phone]

## 2023-12-30 ENCOUNTER — Other Ambulatory Visit: Payer: Self-pay

## 2023-12-30 MED ORDER — ATORVASTATIN CALCIUM 80 MG PO TABS: 80.0000 mg | ORAL_TABLET | Freq: Every day | ORAL | 3 refills | Status: AC

## 2023-12-31 ENCOUNTER — Other Ambulatory Visit: Payer: Self-pay

## 2024-01-07 DIAGNOSIS — N189 Chronic kidney disease, unspecified: Secondary | ICD-10-CM | POA: Diagnosis not present

## 2024-01-07 DIAGNOSIS — N1832 Chronic kidney disease, stage 3b: Secondary | ICD-10-CM | POA: Diagnosis not present

## 2024-01-07 DIAGNOSIS — E1022 Type 1 diabetes mellitus with diabetic chronic kidney disease: Secondary | ICD-10-CM | POA: Diagnosis not present

## 2024-01-07 DIAGNOSIS — N2581 Secondary hyperparathyroidism of renal origin: Secondary | ICD-10-CM | POA: Diagnosis not present

## 2024-01-07 DIAGNOSIS — D631 Anemia in chronic kidney disease: Secondary | ICD-10-CM | POA: Diagnosis not present

## 2024-01-07 DIAGNOSIS — I951 Orthostatic hypotension: Secondary | ICD-10-CM | POA: Diagnosis not present

## 2024-01-21 NOTE — Telephone Encounter (Signed)
 Patient seen in office 12/15/23 by Dr. Krasowski for pre-op evaluation, do not see note got routed. Will fax and remove from preop APP box.

## 2024-01-26 ENCOUNTER — Other Ambulatory Visit: Payer: Self-pay

## 2024-01-26 MED ORDER — CLOPIDOGREL BISULFATE 75 MG PO TABS: 75.0000 mg | ORAL_TABLET | Freq: Every day | ORAL | 2 refills | Status: AC

## 2024-02-11 ENCOUNTER — Ambulatory Visit (HOSPITAL_BASED_OUTPATIENT_CLINIC_OR_DEPARTMENT_OTHER): Admitting: Family Medicine
